# Patient Record
Sex: Female | Born: 1990 | Race: White | Hispanic: No | Marital: Married | Smoking: Never smoker
Health system: Southern US, Community
[De-identification: ages and names within clinical notes are randomized; demographics above are authoritative.]

## PROBLEM LIST (undated history)

## (undated) ENCOUNTER — Inpatient Hospital Stay (HOSPITAL_COMMUNITY): Payer: Self-pay

## (undated) ENCOUNTER — Ambulatory Visit

## (undated) DIAGNOSIS — R51 Headache: Secondary | ICD-10-CM

## (undated) DIAGNOSIS — G90A Postural orthostatic tachycardia syndrome (POTS): Secondary | ICD-10-CM

## (undated) DIAGNOSIS — R519 Headache, unspecified: Secondary | ICD-10-CM

## (undated) DIAGNOSIS — R55 Syncope and collapse: Secondary | ICD-10-CM

## (undated) DIAGNOSIS — R079 Chest pain, unspecified: Secondary | ICD-10-CM

## (undated) HISTORY — DX: Headache, unspecified: R51.9

## (undated) HISTORY — DX: Postural orthostatic tachycardia syndrome (POTS): G90.A

## (undated) HISTORY — DX: Syncope and collapse: R55

## (undated) HISTORY — PX: NO PAST SURGERIES: SHX2092

## (undated) HISTORY — DX: Chest pain, unspecified: R07.9

## (undated) HISTORY — DX: Headache: R51

---
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
## 2010-01-10 ENCOUNTER — Telehealth: Payer: Self-pay | Admitting: Family Medicine

## 2010-01-24 ENCOUNTER — Ambulatory Visit: Payer: Self-pay | Admitting: Family Medicine

## 2010-01-26 LAB — CONVERTED CEMR LAB
Chlamydia, DNA Probe: NEGATIVE
Hep B C IgM: NEGATIVE
hCG, Beta Chain, Quant, S: 0.28 milliintl units/mL

## 2010-02-07 ENCOUNTER — Ambulatory Visit: Payer: Self-pay | Admitting: Family Medicine

## 2010-02-07 LAB — CONVERTED CEMR LAB: Beta hcg, urine, semiquantitative: NEGATIVE

## 2010-02-10 ENCOUNTER — Telehealth: Payer: Self-pay | Admitting: Family Medicine

## 2010-03-28 ENCOUNTER — Telehealth (INDEPENDENT_AMBULATORY_CARE_PROVIDER_SITE_OTHER): Payer: Self-pay | Admitting: *Deleted

## 2010-05-02 ENCOUNTER — Encounter: Payer: Self-pay | Admitting: Family Medicine

## 2010-05-02 ENCOUNTER — Ambulatory Visit
Admission: RE | Admit: 2010-05-02 | Discharge: 2010-05-02 | Payer: Self-pay | Source: Home / Self Care | Attending: Family Medicine | Admitting: Family Medicine

## 2010-05-11 NOTE — Assessment & Plan Note (Signed)
Summary: COUGH,CONGESTION/CLE   Vital Signs:  Patient profile:   20 year old female Height:      67 inches Weight:      111.2 pounds BMI:     17.48 Temp:     101.7 degrees F oral Pulse rate:   88 / minute Pulse rhythm:   regular BP sitting:   90 / 60  (left arm) Cuff size:   regular  Vitals Entered By: Benny Lennert CMA Duncan Dull) (April 25, 2009 12:37 PM)  History of Present Illness: Chief complaint cough congestion and sore throat  20 year old fever with a one-week history of fever, with a temperature max of 102F, arthralgias, coughing, sinus pain and congestion.  Primary complaints are arthralgias and cough. She has been taking some Mucinex DM some NyQuil at nighttime. She also has tried Delsym, which has helped some.  Nothing has really helped with her short throat, and she continues now to have some sinus pain.  She is a Printmaker in college.  REVIEW OF SYSTEMS GEN: Acute illness details above. CV: No chest pain or SOB GI: No noted N or V Otherwise, pertinent positives and negatives are noted in the HPI.   Gen: WDWN, NAD; A & O x3, cooperative. Pleasant.Globally Non-toxic HEENT: Normocephalic and atraumatic. Throat clear, w/o exudate, R TM clear, L TM - good landmarks, No fluid present. rhinnorhea. frontal sinus T. MMM NECK: Anterior cervical  LAD is present CV: RRR, No M/G/R, cap refill <2 sec PULM: Breathing comfortably in no respiratory distress. no wheezing, crackles, rhonchi ABD: S,NT,ND,+BS. No HSM. No rebound. EXT: No c/c/e PSYCH: Friendly, good eye contact MSK: Nml gait   Allergies: 1)  Vicodin (Hydrocodone-Acetaminophen)   Impression & Recommendations:  Problem # 1:  INFLUENZA WITH OTHER RESPIRATORY MANIFESTATIONS (ICD-487.1) Assessment New  The patient's clinical exam and history is consistent with a diagnosis of pneumonia.  I discussed with the patient that the CDC and [place] Infectious disease personel are recommended that we not do the  rapid influenza screening test on people that have a clinical history and exam consistent with the diagnosis.  Supportive care. Fluids. Cough medicines as needed  Anti-pyretics.  Infection control emphasized, including OOW or school until AF 24 hours.   Orders: Rapid Strep (04540)  Problem # 2:  SINUSITIS - ACUTE-NOS (ICD-461.9) Assessment: New cannot rule out sinusitis secondary with pain - treat with amox, high dose  Her updated medication list for this problem includes:    Amoxicillin 875 Mg Tabs (Amoxicillin) .Marland Kitchen... 1 by mouth two times a day  Complete Medication List: 1)  Sprintec 28 0.25-35 Mg-mcg Tabs (Norgestimate-eth estradiol) .Marland Kitchen.. 1 tab by mouth daily 2)  Amoxicillin 875 Mg Tabs (Amoxicillin) .Marland Kitchen.. 1 by mouth two times a day Prescriptions: AMOXICILLIN 875 MG TABS (AMOXICILLIN) 1 by mouth two times a day  #20 x 0   Entered and Authorized by:   Hannah Beat MD   Signed by:   Hannah Beat MD on 04/25/2009   Method used:   Electronically to        [facility]  [address] [phone]* (retail)       [address]       [place], [place]  [address]       Ph: [phone]       Fax: [phone]   RxID:   [id number]   Current Allergies (reviewed today): VICODIN (HYDROCODONE-ACETAMINOPHEN)  Laboratory Results    Other Tests  Rapid  Strep: negative  Kit Test Internal QC: Positive   (Normal Range: Negative)

## 2010-05-11 NOTE — Letter (Signed)
Summary: Medical Report Form  Medical Report Form   Imported By: Lanelle Bal 04/15/2009 11:41:58  _____________________________________________________________________  External Attachment:    Type:   Image     Comment:   External Document

## 2010-05-11 NOTE — Progress Notes (Signed)
Summary: regarding depo  Phone Note Call from Patient Call back at Home Phone [phone] Call back at [phone]   Caller: Mom Summary of Call: Pt's mother states pt was given depo provera and asks if she needs to use back up [facility] or is depo effective from the start. Advised pt to use back up method for one month  Initial call taken by: Lowella Petties CMA, AAMA,  February 10, 2010 4:22 PM  Follow-up for Phone Call        Agree. Follow-up by: Kerby Nora MD,  February 10, 2010 4:33 PM

## 2010-05-11 NOTE — Assessment & Plan Note (Signed)
Summary: CPX/ lb   Vital Signs:  Patient profile:   20 year old female Height:      67 inches Weight:      111.0 pounds BMI:     17.45 Temp:     99.3 degrees F oral Pulse rate:   88 / minute Pulse rhythm:   regular BP sitting:   90 / 60  (left arm) Cuff size:   regular  Vitals Entered By: Benny Lennert CMA Duncan Dull) (January 24, 2010 4:06 PM)  History of Present Illness: Chief complaint discuss birth control ? pregnancy    The patient is here for annual wellness exam and preventative care.     Interested in DEPO for birth control. She is currently sexually active. First day of last menses.Marland KitchenMarland Kitchen9/14 Started on new packet of sprintec...but missed 1 week of pills...so restarted new packet. Continued sexually activity. Was due for menses 10/14... At home urine test was negative.   No known exposure to STDs.     Problems Prior to Update: 1)  Oligomenorrhea  (ICD-626.1) 2)  Sinusitis - Acute-nos  (ICD-461.9) 3)  Influenza With Other Respiratory Manifestations  (ICD-487.1) 4)  Sexually Transmitted Disease, Exposure To  (ICD-V01.6) 5)  Contraceptive Management  (ICD-V25.09) 6)  Underweight  (ICD-783.22) 7)  Oligomenorrhea  (ICD-626.1) 8)  Headache  (ICD-784.0) 9)  Vasovagal Syncope  (ICD-780.2)  Current Medications (verified): 1)  Sprintec 28 0.25-35 Mg-Mcg Tabs (Norgestimate-Eth Estradiol) .Marland Kitchen.. 1 Tab By Mouth Daily  Allergies: 1)  Vicodin (Hydrocodone-Acetaminophen)  Past History:  Past medical, surgical, family and social histories (including risk factors) reviewed, and no changes noted (except as noted below).  Family History: Reviewed history from 06/08/2008 and no changes required. father: no medical history, no contact with him PGM: MI age 59 mother: healthy MGM: osteoporosis MGF: borderline DM no siblings  Social History: Reviewed history from 06/08/2008 and no changes required. In 11th grade homeschool due to frequent syncope. Plays volleyball. No sure  career plans, likely college. Good grades with work. Not sexaually active.  Review of Systems General:  Complains of fatigue; denies fever. CV:  Denies chest pain or discomfort. Resp:  Denies shortness of breath. GI:  Denies abdominal pain, bloody stools, change in bowel habits, constipation, diarrhea, nausea, and vomiting; abdominal bloating x several days  Eating fruits and veggies... GU:  Denies dysuria and hematuria. Derm:  Denies rash.  Physical Exam  General:  Well-developed,well-nourished,in no acute distress; alert,appropriate and cooperative throughout examination Ears:  External ear exam shows no significant lesions or deformities.  Otoscopic examination reveals clear canals, tympanic membranes are intact bilaterally without bulging, retraction, inflammation or discharge. Hearing is grossly normal bilaterally. Nose:  External nasal examination shows no deformity or inflammation. Nasal mucosa are pink and moist without lesions or exudates. Mouth:  Oral mucosa and oropharynx without lesions or exudates.  Teeth in good repair. Neck:  no carotid bruit or thyromegaly no cervical or supraclavicular lymphadenopathy  Lungs:  Normal respiratory effort, chest expands symmetrically. Lungs are clear to auscultation, no crackles or wheezes. Heart:  Normal rate and regular rhythm. S1 and S2 normal without gallop, murmur, click, rub or other extra sounds. Abdomen:  Bowel sounds positive,abdomen soft and non-tender without masses, organomegaly or hernias noted. Genitalia:  Pelvic Exam:        External: normal female genitalia without lesions or masses        Vagina: normal without lesions or masses        Cervix: normal without lesions or masses  Adnexa: normal bimanual exam without masses or fullness        Uterus: normal by palpation        Pap smear: not performed Pulses:  R and L posterior tibial pulses are full and equal bilaterally  Extremities:  no edema  Neurologic:  No  cranial nerve deficits noted. Station and gait are normal. Plantar reflexes are down-going bilaterally. DTRs are symmetrical throughout. Sensory, motor and coordinative functions appear intact. Skin:  Intact without suspicious lesions or rashes Psych:  Cognition and judgment appear intact. Alert and cooperative with normal attention span and concentration. No apparent delusions, illusions, hallucinations   Impression & Recommendations:  Problem # 1:  Preventive Health Care (ICD-V70.0) The patient's preventative maintenance and recommended screening tests for an annual wellness exam were reviewed in full today. Brought up to date unless services declined.  Counselled on the importance of diet, exercise, and its role in overall health and mortality. The patient's FH and SH was reviewed, including their home life, tobacco status, and drug and alcohol status.     Problem # 2:  OLIGOMENORRHEA (ICD-626.1) Pregnancy test Orders: TLB-Preg Serum Quant (B-hCG) (84702-HCG-QN)  Problem # 3:  SEXUALLY TRANSMITTED DISEASE, EXPOSURE TO (ICD-V01.6)  Orders: T-Hepatitis Acute Panel (04540-98119) T-GC Probe, genital (14782-95621) T-Chlamydia Probe, genital (30865-78469) T-RPR (Syphilis) (62952-84132) T-HIV Antibody  (Reflex) ([id number])  Problem # 4:  CONTRACEPTIVE MANAGEMENT (ICD-V25.09) Discussed options. If preg test negative x 2 ..will change to Depo  Complete Medication List: 1)  Sprintec 28 0.25-35 Mg-mcg Tabs (Norgestimate-eth estradiol) .Marland Kitchen.. 1 tab by mouth daily  Other Orders: Flu Vaccine 18yrs + (66440) Admin 1st Vaccine (34742) Tdap => 75yrs IM (59563) Admin of Any Addtl Vaccine (87564)  Patient Instructions: 1)  We will cal with blood test results. 2)   if negative pregnancy test..return in 2 weeks for urine pregnancy with RN OV...if negative you can recieve DEPO at that OV.    Orders Added: 1)  T-Hepatitis Acute Panel [80074-22940] 2)  T-GC Probe, genital [phone] 3)   T-Chlamydia Probe, genital [66063-01601] 4)  T-RPR (Syphilis) [phone] 5)  T-HIV Antibody  (Reflex) [[id number]] 6)  Flu Vaccine 87yrs + [90658] 7)  Admin 1st Vaccine [90471] 8)  Tdap => 49yrs IM [90715] 9)  Admin of Any Addtl Vaccine [90472] 10)  TLB-Preg Serum Quant (B-hCG) [84702-HCG-QN] 11)  Est. Patient 18-39 years [99395]   Immunizations Administered:  Influenza Vaccine # 1:    Vaccine Type: Fluvax 3+    Site: left deltoid    Mfr: GlaxoSmithKline    Dose: 0.5 ml    Route: IM    Given by: Benny Lennert CMA (AAMA)    Exp. Date: 10/07/2010    Lot #: [id number]    VIS given: 11/01/09 version given January 24, 2010.  Tetanus Vaccine:    Vaccine Type: Tdap    Site: right deltoid    Mfr: GlaxoSmithKline    Dose: 0.5 ml    Route: IM    Given by: Benny Lennert CMA (AAMA)    Exp. Date: 01/27/2012    Lot #: [id number]    VIS given: 02/25/08 version given January 24, 2010.  Flu Vaccine Consent Questions:    Do you have a history of severe allergic reactions to this vaccine? no    Any prior history of allergic reactions to egg and/or gelatin? no    Do you have a sensitivity to the preservative Thimersol? no    Do you have a past history of Guillan-Barre Syndrome?  no    Do you currently have an acute febrile illness? no    Have you ever had a severe reaction to latex? no    Vaccine information given and explained to patient? yes    Are you currently pregnant? no   Immunizations Administered:  Influenza Vaccine # 1:    Vaccine Type: Fluvax 3+    Site: left deltoid    Mfr: GlaxoSmithKline    Dose: 0.5 ml    Route: IM    Given by: Benny Lennert CMA (AAMA)    Exp. Date: 10/07/2010    Lot #: [id number]    VIS given: 11/01/09 version given January 24, 2010.  Tetanus Vaccine:    Vaccine Type: Tdap    Site: right deltoid    Mfr: GlaxoSmithKline    Dose: 0.5 ml    Route: IM    Given by: Benny Lennert CMA (AAMA)    Exp. Date: 01/27/2012    Lot #:  [id number]    VIS given: 02/25/08 version given January 24, 2010.  Current Allergies (reviewed today): VICODIN (HYDROCODONE-ACETAMINOPHEN) Flu Vaccine Result Date:  01/24/2010 Flu Vaccine Result:  given Flu Vaccine Next Due:  1 yr TD Result Date:  01/24/2010 TD Result:  given TD Next Due:  10 yr HDL Next Due:  Not Indicated LDL Next Due:  Not Indicated

## 2010-05-11 NOTE — Assessment & Plan Note (Signed)
Summary: pregnancy test/dlo  Nurse Visit   Allergies: 1)  Vicodin (Hydrocodone-Acetaminophen) Laboratory Results   Urine Tests      Urine HCG: negative    Medication Administration  Injection # 1:    Medication: Depo-Provera 150mg     Diagnosis: CONTRACEPTIVE MANAGEMENT (ICD-V25.09)    Route: IM    Site: LUOQ gluteus    Exp Date: 06/07/2012    Lot #: [id number]    Mfr: Francisca December    Patient tolerated injection without complications    Given by: Benny Lennert CMA Duncan Dull) (February 07, 2010 4:24 PM)  Orders Added: 1)  Depo-Provera 150mg  [J1055] 2)  Admin of Therapeutic Inj  intramuscular or subcutaneous [04540]

## 2010-05-11 NOTE — Assessment & Plan Note (Signed)
Summary: DEPO  Nurse Visit   Allergies: 1)  Vicodin (Hydrocodone-Acetaminophen)  Medication Administration  Injection # 1:    Medication: Depo-Provera 150mg     Diagnosis: CONTRACEPTIVE MANAGEMENT (ICD-V25.09)    Route: IM    Site: LUOQ gluteus    Exp Date: 08/07/2012    Lot #: [id number]    Mfr: GREENSTONE    Patient tolerated injection without complications    Given by: Mervin Hack CMA Duncan Dull) (May 02, 2010 5:01 PM)  Orders Added: 1)  Depo-Provera 150mg  [J1055] 2)  Admin of Therapeutic Inj  intramuscular or subcutaneous [96372]   Medication Administration  Injection # 1:    Medication: Depo-Provera 150mg     Diagnosis: CONTRACEPTIVE MANAGEMENT (ICD-V25.09)    Route: IM    Site: LUOQ gluteus    Exp Date: 08/07/2012    Lot #: [id number]    Mfr: GREENSTONE    Patient tolerated injection without complications    Given by: Mervin Hack CMA Duncan Dull) (May 02, 2010 5:01 PM)  Orders Added: 1)  Depo-Provera 150mg  [J1055] 2)  Admin of Therapeutic Inj  intramuscular or subcutaneous [81191]

## 2010-05-11 NOTE — Progress Notes (Signed)
Summary: wants to change to depo provera  Phone Note Call from Patient   Caller: Ariel Hale  [phone] x [phone] Summary of Call: Mother states pt wants to change to depo provera, she is willing to pay out of pocket for this if insurance wont cover.  She says pt discussed this with you about a year and a half ago.  [facility]  She can get the vials from the pharmacy and come here for administration. Initial call taken by: Lowella Petties CMA,  January 10, 2010 12:24 PM  Follow-up for Phone Call        Can we not just use what we have here? or is it cheaper for her to get at pharm. She needs to come in within 7 days of her next menses to have U [preg...if negative we can start Depo then.  She is also overdue for yearly CPX...will need labs prior for STD testing given birth control use. 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

## 2010-05-11 NOTE — Progress Notes (Signed)
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  Mother is asking how long they should wait for sxs to appear.  Pt is supposed to be around elderly relatives on saturday night.  She had the exposure yesterday. Initial call taken by: Lowella Petties CMA, AAMA,  March 28, 2010 10:38 AM  Follow-up for Phone Call        It sounds like she is not  sick -- if she is not sick, she will know by saturday night.  If symptoms come out (ie. she is sick), she should avoid people undergoing chemotherapy, radiation, end-stage cancer, or AIDS.   Most adults are already immune to most of the strains of hand-foot-mouth. Follow-up by: Hannah Beat MD,  March 28, 2010 10:59 AM  Additional Follow-up for Phone Call Additional follow up Details #1::        Phone Call Completed message left for mother with information and advised to call back with any questions.Consuello Masse CMA    Additional Follow-up by: Benny Lennert CMA Duncan Dull),  March 28, 2010 11:02 AM

## 2010-05-25 NOTE — Letter (Signed)
Summary: [facility]   [facility]   Imported By: Kassie Mends 05/17/2010 11:35:01  _____________________________________________________________________  External Attachment:    Type:   Image     Comment:   External Document

## 2010-05-29 ENCOUNTER — Encounter: Payer: Self-pay | Admitting: Family Medicine

## 2010-05-29 ENCOUNTER — Ambulatory Visit (INDEPENDENT_AMBULATORY_CARE_PROVIDER_SITE_OTHER): Payer: BC Managed Care – PPO | Admitting: Family Medicine

## 2010-05-29 DIAGNOSIS — J069 Acute upper respiratory infection, unspecified: Secondary | ICD-10-CM

## 2010-05-29 DIAGNOSIS — H109 Unspecified conjunctivitis: Secondary | ICD-10-CM

## 2010-05-29 DIAGNOSIS — R55 Syncope and collapse: Secondary | ICD-10-CM

## 2010-05-30 ENCOUNTER — Telehealth: Payer: Self-pay | Admitting: Family Medicine

## 2010-05-30 ENCOUNTER — Encounter (INDEPENDENT_AMBULATORY_CARE_PROVIDER_SITE_OTHER): Payer: Self-pay | Admitting: *Deleted

## 2010-05-30 ENCOUNTER — Other Ambulatory Visit: Payer: Self-pay | Admitting: Family Medicine

## 2010-05-30 ENCOUNTER — Ambulatory Visit (INDEPENDENT_AMBULATORY_CARE_PROVIDER_SITE_OTHER): Payer: BC Managed Care – PPO | Admitting: Family Medicine

## 2010-05-30 ENCOUNTER — Encounter: Payer: Self-pay | Admitting: Family Medicine

## 2010-05-30 DIAGNOSIS — R55 Syncope and collapse: Secondary | ICD-10-CM

## 2010-05-30 DIAGNOSIS — R51 Headache: Secondary | ICD-10-CM

## 2010-05-30 DIAGNOSIS — F341 Dysthymic disorder: Secondary | ICD-10-CM | POA: Insufficient documentation

## 2010-05-30 DIAGNOSIS — M542 Cervicalgia: Secondary | ICD-10-CM

## 2010-05-30 DIAGNOSIS — R519 Headache, unspecified: Secondary | ICD-10-CM

## 2010-05-30 LAB — B12 AND FOLATE PANEL: Vitamin B-12: 451 pg/mL (ref 211–911)

## 2010-05-30 LAB — BASIC METABOLIC PANEL
BUN: 11 mg/dL (ref 6–23)
Chloride: 105 mEq/L (ref 96–112)
GFR: 97.76 mL/min (ref >60.00)
Potassium: 4.5 mEq/L (ref 3.5–5.1)
Sodium: 140 mEq/L (ref 135–145)

## 2010-05-30 LAB — CBC WITH DIFFERENTIAL/PLATELET
Basophils Absolute: 0.1 K/uL (ref 0.0–0.1)
Basophils Relative: 0.7 % (ref 0.0–3.0)
Eosinophils Absolute: 0.3 K/uL (ref 0.0–0.7)
Eosinophils Relative: 3.5 % (ref 0.0–5.0)
HCT: 37.9 % (ref 36.0–46.0)
Hemoglobin: 12.6 g/dL (ref 12.0–15.0)
Lymphocytes Relative: 23.4 % (ref 12.0–46.0)
Lymphs Abs: 2.2 K/uL (ref 0.7–4.0)
MCHC: 33.3 g/dL (ref 30.0–36.0)
MCV: 81.8 fl (ref 78.0–100.0)
Monocytes Absolute: 1.2 K/uL — ABNORMAL HIGH (ref 0.1–1.0)
Monocytes Relative: 12.2 % — ABNORMAL HIGH (ref 3.0–12.0)
Neutro Abs: 5.7 K/uL (ref 1.4–7.7)
Neutrophils Relative %: 60.2 % (ref 43.0–77.0)
Platelets: 273 K/uL (ref 150.0–400.0)
RBC: 4.63 Mil/uL (ref 3.87–5.11)
RDW: 15.3 % — ABNORMAL HIGH (ref 11.5–14.6)
WBC: 9.5 K/uL (ref 4.5–10.5)

## 2010-05-30 LAB — TSH: TSH: 1 u[IU]/mL (ref 0.35–5.50)

## 2010-05-31 ENCOUNTER — Ambulatory Visit (INDEPENDENT_AMBULATORY_CARE_PROVIDER_SITE_OTHER)
Admission: RE | Admit: 2010-05-31 | Discharge: 2010-05-31 | Disposition: A | Discharge status: Home / Self Care | Payer: BC Managed Care – PPO | Source: Ambulatory Visit | Attending: Family Medicine | Admitting: Family Medicine

## 2010-05-31 ENCOUNTER — Encounter: Payer: Self-pay | Admitting: Family Medicine

## 2010-05-31 DIAGNOSIS — R55 Syncope and collapse: Secondary | ICD-10-CM

## 2010-05-31 DIAGNOSIS — R51 Headache: Secondary | ICD-10-CM

## 2010-05-31 DIAGNOSIS — R519 Headache, unspecified: Secondary | ICD-10-CM

## 2010-05-31 LAB — HEPATIC FUNCTION PANEL
ALT: 30 U/L (ref 0–35)
AST: 27 U/L (ref 0–37)
Total Bilirubin: 0.5 mg/dL (ref 0.3–1.2)
Total Protein: 7.8 g/dL (ref 6.0–8.3)

## 2010-06-06 NOTE — Letter (Signed)
Summary: Out of Work  [facility] [employer] [facility]  [address] [place], [place] [address]   Phone: [phone]  Fax: [phone]    May 30, 2010   Employee:  RENAE MOTTLEY CHAVIS    To Whom It May Concern:   For Medical reasons, please excuse the above named employee from work for the following dates:  Start:   February 20th 2012  End:   May return on Febuary 23 2012  If you need additional information, please feel free to contact our office.         Sincerely,   Kerby Nora Md

## 2010-06-06 NOTE — Assessment & Plan Note (Addendum)
Summary: F/U FROM DR COPLAND/CLE  [facility]   Vital Signs:  Patient profile:   20 year old female Height:      67 inches Weight:      118.75 pounds BMI:     18.67 Temp:     98.6 degrees F oral Pulse rate:   76 / minute Pulse rhythm:   regular BP sitting:   110 / 70  (left arm) Cuff size:   regular  Vitals Entered By: Benny Lennert CMA Duncan Dull) (May 30, 2010 11:24 AM)  History of Present Illness: Chief complaint follow up per copland  Yesterday at appt for conjunctivitis... syncopal episode in the office. at the completion of my history and examination, the patient got pale and lightheaded, and subsequently  moved in a supine direction and  appeared to have a syncopal episode for  1-2 seconds.  She then awoke, and was oriented to self, place,  year, state, Pres of [place], address. the chart was reviewed including prior notes from [facility] cardiology, is noted that she had a normal echo, and electrocardiogram in the past. She had a positive tilt table test, ultimately specialists concluded that she does have significant vasovagal  symptoms the  and has had  a couple episodes such as this ongoing for about the last 5 years. None recent.  Last saw Dr. Ace Gins...2009.   EKG: Normal sinus rhythm. Normal axis, normal R wave progression, No acute ST elevation or depression. irbb   In office:  placed with feet elevated for a few minutes with good recovery. clearly alert and oriented after this incident.  took theraflu this AM.  Dr. Cyndie Chime recommendations: Given prior extensive work-up, pos tilt table, I think we can assume this was a vasovagal episode. push fluids, salt, plain mucinex and avoid decongestants.  Since yesterday feeling lightheaded, weak. Decreased appetite. Headache today ( after fainting felt lke hit in head with hammer) She states she has headaches every day.. for past year. Not using OTC meds regularly. Tearful about passing out episode.  Runny nose, sore throat, cough. Some  achiness, and generally not feeling well. Some ear fullness. No nausea, vomiting or diarrhea.  Per mother symptoms are occuring more frequently. Neck Pain: Has pain in right or left anterior neck...severe lasts minuteds.... then is sore afterward. No associated times of occurence. No association with passing out. No problems swallowing. No association when eating. She does feel anxious.. she is alwys worried she will pass out. More tearful lately.    Problems Prior to Update: 1)  Uri  (ICD-465.9) 2)  Conjunctivitis  (ICD-372.30) 3)  Routine Gynecological Examination  (ICD-V72.31) 4)  Physical Examination  (ICD-V70.0) 5)  Oligomenorrhea  (ICD-626.1) 6)  Sinusitis - Acute-nos  (ICD-461.9) 7)  Influenza With Other Respiratory Manifestations  (ICD-487.1) 8)  Sexually Transmitted Disease, Exposure To  (ICD-V01.6) 9)  Contraceptive Management  (ICD-V25.09) 10)  Underweight  (ICD-783.22) 11)  Oligomenorrhea  (ICD-626.1) 12)  Headache  (ICD-784.0) 13)  Vasovagal Syncope  (ICD-780.2)  Current Medications (verified): 1)  Polytrim 10000-0.1 Unit/ml-%  Soln (Polymyxin B-Trimethoprim) .Marland Kitchen.. 1 Gtt in Affected Eye 4 Times Daily X 7 Days  Allergies: 1)  Vicodin (Hydrocodone-Acetaminophen)  Past History:  Past medical, surgical, family and social histories (including risk factors) reviewed, and no changes noted (except as noted below).  Past Medical History: Reviewed history from 05/29/2010 and no changes required. frequent vasovagal syncope  Family History: Reviewed history from 06/08/2008 and no changes required. father: no medical history, no contact with him  PGM: MI age 40 mother: healthy MGM: osteoporosis MGF: borderline DM no siblings  Social History: Reviewed history from 06/08/2008 and no changes required. In 11th grade homeschool due to frequent syncope. Plays volleyball. No sure career plans, likely college. Good grades with work. Not sexaually active.  Review of  Systems       bloody noses occuring monthly General:  Denies fatigue and fever. CV:  Denies chest pain or discomfort. Resp:  Denies shortness of breath. GI:  Denies abdominal pain and bloody stools. GU:  Denies abnormal vaginal bleeding and dysuria. Psych:  Complains of depression and panic attacks; denies anxiety.  Physical Exam  General:  tearful thin appearing female  in no physical distress, no increased work of breathing Head:  frontal ttp   no maxullar yinu ttp Ears:  clear lfuid B TMS  Nose:  nasal discharge, no mucosal pallor.   Mouth:  MMM Neck:  no carotid bruit or thyromegaly no cervical or supraclavicular lymphadenopathy  Lungs:  Normal respiratory effort, chest expands symmetrically. Lungs are clear to auscultation, no crackles or wheezes. Heart:  Normal rate and regular rhythm. S1 and S2 normal without gallop, murmur, click, rub or other extra sounds. Abdomen:  Bowel sounds positive,abdomen soft and non-tender without masses, organomegaly or hernias noted. Pulses:  R and L posterior tibial pulses are full and equal bilaterally  Extremities:  no edema  Neurologic:  No cranial nerve deficits noted. Station and gait are normal. Plantar reflexes are down-going bilaterally. DTRs are symmetrical throughout. Sensory, motor and coordinative functions appear intact. Skin:  Intact without suspicious lesions or rashes Psych:  Oriented X3, memory intact for recent and remote, subdued, and tearful.     Impression & Recommendations:  Problem # 1:  SYNCOPE (ICD-780.2) Per cards.Marland Kitchen appears consistent with vasovagal syncope... but pt and mother concerned as it is happening frequently (yesterday may have been triggeres by carotid pressure with lymph node eval by Dr. Patsy Lager) and ofter withou clear cause. Also given daily headhjaces... we will eval with labs further, check Head CT and refer to neuro for further recommendations.  Orders: Neurology Referral (Neuro) Radiology Referral  (Radiology) TLB-BMP (Basic Metabolic Panel-BMET) (80048-METABOL) TLB-CBC Platelet - w/Differential (85025-CBCD) TLB-Hepatic/Liver Function Pnl (80076-HEPATIC) TLB-TSH (Thyroid Stimulating Hormone) (84443-TSH) TLB-B12 + Folate Pnl (95621_30865-H84/ONG)  Problem # 2:  DEPRESSION/ANXIETY (ICD-300.4) New diagnosis... refer for counsleing. Will hold of on medicaiton at this time to avoid med side effects that could worsen lightheadedness.  Close follow up in 2-3 weeks.  Orders: Psychology Referral (Psychology)  Problem # 3:  NECK PAIN, ACUTE (ICD-723.1) No clear assocaitipon with syncope.. no lymphadenopathy or thyroid eval noted. Will eval with labs.  ? due to muscle spasm/strain.   Problem # 4:  HEADACHE (ICD-784.0) Possibly due to migraine. Unable to keep migraine diary as requested in apst.  Eval with Ct and refer to neuro.  Orders: Radiology Referral (Radiology)  Complete Medication List: 1)  Polytrim 10000-0.1 Unit/ml-% Soln (Polymyxin b-trimethoprim) .Marland Kitchen.. 1 gtt in affected eye 4 times daily x 7 days  Patient Instructions: 1)  Push fluids. 2)  Tylenol for headhace. 3)  Referral Appointment Information 4)  Day/Date: 5)  Time: 6)  Place/MD: 7)  Address: 8)  Phone/Fax: 9)  Patient given appointment information. Information/Orders faxed/mailed. 10)  Please schedule a follow-up appointment in 2-3 weeks 30 min OV syncope   Orders Added: 1)  Neurology Referral [Neuro] 2)  Psychology Referral [Psychology] 3)  Radiology Referral [Radiology] 4)  TLB-BMP (Basic Metabolic Panel-BMET) [  80048-METABOL] 5)  TLB-CBC Platelet - w/Differential [85025-CBCD] 6)  TLB-Hepatic/Liver Function Pnl [80076-HEPATIC] 7)  TLB-TSH (Thyroid Stimulating Hormone) [84443-TSH] 8)  TLB-B12 + Folate Pnl [82746_82607-B12/FOL] 9)  Est. Patient Level IV [09811]    Current Allergies (reviewed today): VICODIN (HYDROCODONE-ACETAMINOPHEN)

## 2010-06-06 NOTE — Progress Notes (Signed)
Summary: needs note for work  Phone Note Call from Patient Call back at [employer] [phone]   Caller: Mom- Cordelia Pen Call For: Kerby Nora MD Summary of Call: Mom is asking if daugther could get a note to excuse her from work for yesterday, today and tomorrow since she is going for her ct. Please call patient when letter is ready.  Initial call taken by: Melody Comas,  May 30, 2010 2:10 PM  Follow-up for Phone Call        Okay to wirte requested work excuse.  Follow-up by: Kerby Nora MD,  May 30, 2010 2:11 PM  Additional Follow-up for Phone Call Additional follow up Details #1::        note wrote and mother advised via message on machine.Consuello Masse CMA   Additional Follow-up by: Benny Lennert CMA Duncan Dull),  May 30, 2010 2:22 PM

## 2010-06-06 NOTE — Assessment & Plan Note (Signed)
Summary: ? CONJUNCTIVITIS   Vital Signs:  Patient profile:   20 year old female Height:      67 inches Weight:      119 pounds BMI:     18.71 Temp:     98.2 degrees F oral Pulse rate:   76 / minute Pulse rhythm:   regular BP sitting:   100 / 60  (left arm) Cuff size:   regular  Vitals Entered By: Benny Lennert CMA Duncan Dull) (May 29, 2010 9:48 AM)  History of Present Illness: Chief complaint conjunctivitis  20 year old female:  conjunctivitis: pleasant female who has some pinkish coloration her eyes and had some  crusting of her eyelids that cause it to  crust up this morning.  uri: Runny nose, sore throat, cough. Some achiness, and generally not feeling well. Some ear fullness. No nausea, vomiting or diarrhea.  syncopal episode in the office. at the completion of my history and examination, the patient got pale and lightheaded, and subsequently  moved in a supine direction and  appeared to have a syncopal episode for  1-2 seconds.  She then awoke, and was oriented to self, place,  year, state, [place] of [place], address. the chart was reviewed including prior notes from [facility] cardiology, is noted that she had a normal echo, and electrocardiogram in the past. She had a positive tilt table test, ultimately specialists concluded that she does have significant vasovagal  symptoms the  and has had  a couple episodes such as this ongoing for about the last 5 years. none recent.    Allergies: 1)  Vicodin (Hydrocodone-Acetaminophen)  Past History:  Past medical, surgical, family and social histories (including risk factors) reviewed, and no changes noted (except as noted below).  Past Medical History: frequent vasovagal syncope  Family History: Reviewed history from 06/08/2008 and no changes required. father: no medical history, no contact with him PGM: MI age 24 mother: healthy MGM: osteoporosis MGF: borderline DM no siblings  Social History: Reviewed history from 06/08/2008 and  no changes required. In 11th grade homeschool due to frequent syncope. Plays volleyball. No sure career plans, likely college. Good grades with work. Not sexaually active.  Review of Systems      See HPI General:  Complains of fatigue; denies fever and sweats. CV:  Complains of fainting and near fainting; denies chest pain or discomfort and palpitations.  Physical Exam  General:  GEN: WDWN, NAD; alert,appropriate and cooperative throughout exam HEENT: Normocephalic and atraumatic. Throat clear, w/o exudate, no LAD, R TM clear, L TM - good landmarks, No fluid present. rhinnorhea.  Left frontal and maxillary sinuses: NT Right frontal and maxillary sinuses: NT NECK: No ant or post LAD CV: RRR, No M/G/R PULM: no resp distress, no accessory muscles.  No retractions. no w/c/r ABD: S,NT,ND,+BS, No HSM EXTR: no c/c/e PSYCH: full affect, pleasant, conversant    Detailed Neurologic Exam  Speech:    Speech is normal; fluent and spontaneous with normal comprehension Cognition:    The patient is oriented to person, place, and time; memory intact; language fluent; normal attention, concentration, and fund of knowledge Cranial Nerves:    The pupils are equal, round, and reactive to light. The fundi are normal and spontaneous venous pulsations are present. Visual fields are full to finger confrontation. Extraocular movements are intact. Trigeminal sensation is intact and the muscles of mastication are normal. The face is symmetric. The palate elevates in the midline. Voice is normal. Shoulder shrug is normal. The  tongue has normal motion without fasciculations.  Coordination:    romberg neg Gait:    normal gait Trapezius:    No tightness or tenderness noted.  Tone:    Normal muscle tone.  Posture:    Posture is normal.  Light Touch:    Normal light touch sensation in upper and lower extremities.  Reflex Exam: DTR's:    Deep tendon reflexes in the upper and lower extremities are normal  bilaterally.     Impression & Recommendations:  Problem # 1:  CONJUNCTIVITIS (ICD-372.30) Assessment New  Her updated medication list for this problem includes:    Polytrim 10000-0.1 Unit/ml-% Soln (Polymyxin b-trimethoprim) .Marland Kitchen... 1 gtt in affected eye 4 times daily x 7 days  Discussed treatment, and urged patient to wash hands carefully after touching face.   Problem # 2:  URI (ICD-465.9) Assessment: New  Instructed on symptomatic treatment. Call if symptoms persist or worsen.   supportive care.  Problem # 3:  VASOVAGAL SYNCOPE (ICD-780.2) Assessment: Deteriorated EKG: Normal sinus rhythm. Normal axis, normal R wave progression, No acute ST elevation or depression. irbb  placed with feet elevated for a few minutes with good recovery. clearly alert and oriented after this incident.  took theraflu this AM.  given prior extensive work-up, pos tilt table, i think we can assume this was a vasovagal episode. push fluids, salt, plain mucinex and avoid decongestants.  Complete Medication List: 1)  Polytrim 10000-0.1 Unit/ml-% Soln (Polymyxin b-trimethoprim) .Marland Kitchen.. 1 gtt in affected eye 4 times daily x 7 days  Patient Instructions: 1)  f/u with Dr. Ermalene Searing at your convenience to discuss neck pain Prescriptions: POLYTRIM 10000-0.1 UNIT/ML-%  SOLN (POLYMYXIN B-TRIMETHOPRIM) 1 gtt in affected eye 4 times daily x 7 days  #1 x 0   Entered and Authorized by:   Hannah Beat MD   Signed by:   Hannah Beat MD on 05/29/2010   Method used:   Electronically to        [id number] (retail)       [address]       [place], [place]  [address]       Ph: [phone]       Fax: [phone]   RxID:   [id number]    Orders Added: 1)  Est. Patient Level IV [24401]    Prior Medications (reviewed today): None Current Allergies (reviewed today): VICODIN (HYDROCODONE-ACETAMINOPHEN)  Appended Document: Orders Update    Clinical Lists Changes  Orders: Added  new Service order of EKG w/ Interpretation (93000) - Signed

## 2010-06-12 ENCOUNTER — Telehealth: Payer: Self-pay | Admitting: Family Medicine

## 2010-06-15 NOTE — Miscellaneous (Signed)
Summary: [facility] Designated Party Release   [facility] Designated Party Release   Imported By: Roderic Ovens 06/07/2010 15:02:48  _____________________________________________________________________  External Attachment:    Type:   Image     Comment:   External Document

## 2010-06-20 ENCOUNTER — Ambulatory Visit: Payer: BC Managed Care – PPO | Admitting: Family Medicine

## 2010-06-20 NOTE — Progress Notes (Signed)
  Phone Note Call from Patient   Caller: Mom Summary of Call: Called and spoke to [facility], Courtneys mom. She said that Verna is being seen by her works Education officer, community for Psychology. She will see the Neurologist on 07/05/2010 at 1:30pm. Initial call taken by: Carlton Adam,  June 12, 2010 8:36 AM  Follow-up for Phone Call        [place]. Follow-up by: Kerby Nora MD,  June 12, 2010 12:35 PM

## 2010-07-25 ENCOUNTER — Ambulatory Visit (INDEPENDENT_AMBULATORY_CARE_PROVIDER_SITE_OTHER): Payer: BC Managed Care – PPO | Admitting: Family Medicine

## 2010-07-25 DIAGNOSIS — Z3009 Encounter for other general counseling and advice on contraception: Secondary | ICD-10-CM

## 2010-07-25 MED ORDER — MEDROXYPROGESTERONE ACETATE 150 MG/ML IM SUSP
150.0000 mg | Freq: Once | INTRAMUSCULAR | Status: AC
  Administered 2010-07-25: 150 mg via INTRAMUSCULAR

## 2010-07-25 NOTE — Progress Notes (Signed)
  Subjective:    Patient ID: Ariel Hale, female    DOB: 1991/03/29, 20 y.o.   MRN: [id number]  HPI  Med given.  Review of Systems     Objective:   Physical Exam        Assessment & Plan:

## 2010-11-03 ENCOUNTER — Ambulatory Visit (INDEPENDENT_AMBULATORY_CARE_PROVIDER_SITE_OTHER): Payer: BC Managed Care – PPO | Admitting: Family Medicine

## 2010-11-03 DIAGNOSIS — Z309 Encounter for contraceptive management, unspecified: Secondary | ICD-10-CM

## 2010-11-03 NOTE — Progress Notes (Signed)
Patient came in today to have depo provera injection.  She is off schedule for her injection so I advised her that she needs two negative pregnancy test before we can give her another depo provera injection.  Patient agreed.  Test was negative.

## 2010-11-17 ENCOUNTER — Ambulatory Visit (INDEPENDENT_AMBULATORY_CARE_PROVIDER_SITE_OTHER): Payer: BC Managed Care – PPO | Admitting: Family Medicine

## 2010-11-17 DIAGNOSIS — Z309 Encounter for contraceptive management, unspecified: Secondary | ICD-10-CM

## 2010-11-17 LAB — POCT URINE PREGNANCY: Preg Test, Ur: NEGATIVE

## 2010-11-17 MED ORDER — MEDROXYPROGESTERONE ACETATE 150 MG/ML IM SUSP
150.0000 mg | Freq: Once | INTRAMUSCULAR | Status: AC
  Administered 2010-11-17: 150 mg via INTRAMUSCULAR

## 2010-11-18 DIAGNOSIS — Z309 Encounter for contraceptive management, unspecified: Secondary | ICD-10-CM | POA: Insufficient documentation

## 2010-11-18 NOTE — Progress Notes (Signed)
  Subjective:    Patient ID: Ariel Hale, female    DOB: [birth year], 20 y.o.   MRN: [id number]  HPI  Nurse visit.  Review of Systems     Objective:   Physical Exam        Assessment & Plan:

## 2011-01-22 LAB — URINALYSIS, ROUTINE W REFLEX MICROSCOPIC
Bilirubin Urine: NEGATIVE
Hgb urine dipstick: NEGATIVE
Nitrite: NEGATIVE
Protein, ur: NEGATIVE
Urobilinogen, UA: 0.2

## 2011-01-22 LAB — POCT PREGNANCY, URINE
Operator id: 272551
Preg Test, Ur: NEGATIVE

## 2011-01-22 LAB — COMPREHENSIVE METABOLIC PANEL
AST: 25
Albumin: 4.4
Alkaline Phosphatase: 81
BUN: 10
CO2: 23
Chloride: 104
Potassium: 4
Total Bilirubin: 1

## 2011-02-07 ENCOUNTER — Ambulatory Visit (INDEPENDENT_AMBULATORY_CARE_PROVIDER_SITE_OTHER): Payer: BC Managed Care – PPO | Admitting: *Deleted

## 2011-02-07 DIAGNOSIS — Z309 Encounter for contraceptive management, unspecified: Secondary | ICD-10-CM

## 2011-02-07 LAB — POCT URINE PREGNANCY: Preg Test, Ur: NEGATIVE

## 2011-02-21 ENCOUNTER — Ambulatory Visit (INDEPENDENT_AMBULATORY_CARE_PROVIDER_SITE_OTHER): Payer: BC Managed Care – PPO | Admitting: *Deleted

## 2011-02-21 DIAGNOSIS — IMO0001 Reserved for inherently not codable concepts without codable children: Secondary | ICD-10-CM

## 2011-02-21 DIAGNOSIS — Z309 Encounter for contraceptive management, unspecified: Secondary | ICD-10-CM

## 2011-02-21 LAB — POCT URINE PREGNANCY: Preg Test, Ur: NEGATIVE

## 2011-02-21 MED ORDER — MEDROXYPROGESTERONE ACETATE 150 MG/ML IM SUSP
150.0000 mg | Freq: Once | INTRAMUSCULAR | Status: AC
  Administered 2011-02-21: 150 mg via INTRAMUSCULAR

## 2011-05-08 ENCOUNTER — Telehealth: Payer: Self-pay | Admitting: *Deleted

## 2011-05-08 ENCOUNTER — Telehealth: Payer: Self-pay | Admitting: Family Medicine

## 2011-05-08 DIAGNOSIS — R55 Syncope and collapse: Secondary | ICD-10-CM

## 2011-05-08 NOTE — Telephone Encounter (Signed)
Patient mother called back and they are trying seeing a dr in [place] for parathyroid and they would like calcium and parathyroid checked

## 2011-05-08 NOTE — Telephone Encounter (Signed)
What is mother's concern.. Why does she want thihs checked. I need to know to get covered by insurance.

## 2011-05-08 NOTE — Telephone Encounter (Signed)
Please cancel CMET.  Change instead to parathyroid hormone and ionized calcium.

## 2011-05-08 NOTE — Telephone Encounter (Signed)
Pt's mom is calling and is wanting to know if her daughter Ariel Hale can have her Calcium level checked for the Parathyroid and she is coming in on 05/09/11 for Depo.

## 2011-05-08 NOTE — Telephone Encounter (Signed)
Addended by: [name] on: 05/08/2011 04:18 PM   Modules accepted: Orders

## 2011-05-08 NOTE — Telephone Encounter (Signed)
Patients mother says that patient faints all the time and they have checked it in the past.

## 2011-05-09 ENCOUNTER — Ambulatory Visit: Payer: BC Managed Care – PPO

## 2011-05-10 ENCOUNTER — Other Ambulatory Visit (INDEPENDENT_AMBULATORY_CARE_PROVIDER_SITE_OTHER): Payer: BC Managed Care – PPO

## 2011-05-10 ENCOUNTER — Ambulatory Visit (INDEPENDENT_AMBULATORY_CARE_PROVIDER_SITE_OTHER): Payer: BC Managed Care – PPO | Admitting: *Deleted

## 2011-05-10 DIAGNOSIS — IMO0001 Reserved for inherently not codable concepts without codable children: Secondary | ICD-10-CM

## 2011-05-10 DIAGNOSIS — R55 Syncope and collapse: Secondary | ICD-10-CM

## 2011-05-10 DIAGNOSIS — Z309 Encounter for contraceptive management, unspecified: Secondary | ICD-10-CM

## 2011-05-10 MED ORDER — MEDROXYPROGESTERONE ACETATE 150 MG/ML IM SUSP
150.0000 mg | Freq: Once | INTRAMUSCULAR | Status: AC
  Administered 2011-05-10: 150 mg via INTRAMUSCULAR

## 2011-05-11 LAB — PTH, INTACT AND CALCIUM
Calcium, Total (PTH): 9.4 mg/dL (ref 8.4–10.5)
PTH: 18.3 pg/mL (ref 14.0–72.0)

## 2011-05-14 ENCOUNTER — Telehealth: Payer: Self-pay | Admitting: *Deleted

## 2011-05-14 NOTE — Telephone Encounter (Signed)
Patients mother advised via message on machine  

## 2011-05-14 NOTE — Telephone Encounter (Signed)
Schedule OV so we can do basic blood work, check blood counts, other things that can affect blood. Then we can make a better informed, directed question to any consultants.

## 2011-05-14 NOTE — Telephone Encounter (Signed)
Patients mother would like patient to have hematology referral because something is making patient pass out

## 2011-06-11 ENCOUNTER — Telehealth: Payer: Self-pay | Admitting: Family Medicine

## 2011-06-11 ENCOUNTER — Other Ambulatory Visit: Payer: Self-pay | Admitting: Family Medicine

## 2011-06-11 NOTE — Telephone Encounter (Signed)
Patient's mom said she left an email from a different doctor with you showing what kind of blood work that needs to be done on her daughter.  She wants to know when they can come in to get that blood work done.  She can be reached at [phone] ex [phone].

## 2011-06-11 NOTE — Telephone Encounter (Signed)
Looking over notes from specialist in [place], I am going to follow their advice and consult a local endocrinologist to be involved in her full work-up, which I think makes very good sense.

## 2011-06-14 NOTE — Telephone Encounter (Signed)
Appt made with Dr Tedd Sias patient aware of appt date and time. 06/14/2011 MK

## 2011-06-23 IMAGING — CT CT HEAD W/O CM
1 series · 16 of 30 positions shown, 20 images · non-contrast
Comparison: 10/23/2006

CLINICAL DATA: Headaches

CT HEAD WITHOUT CONTRAST
TECHNIQUE: Contiguous axial images were obtained from the base of
the skull through the vertex without contrast.

[Series 2: head_seq -c 4.5 h37s st · axial · 0.43mm/px · z∈[-168,-42]mm · 16 of 32 slices shown, 20 images]
[im 2/32  brain]
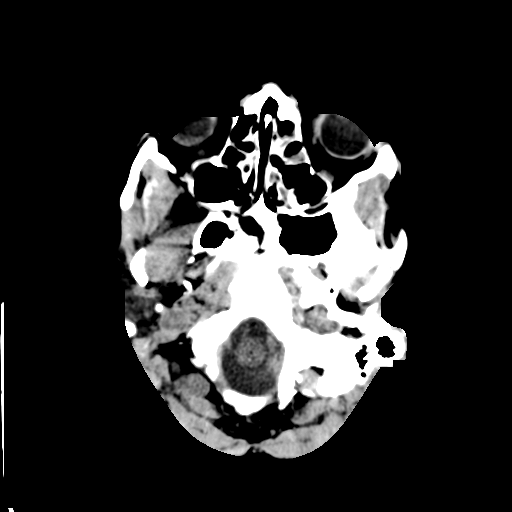
[im 2/32  bone]
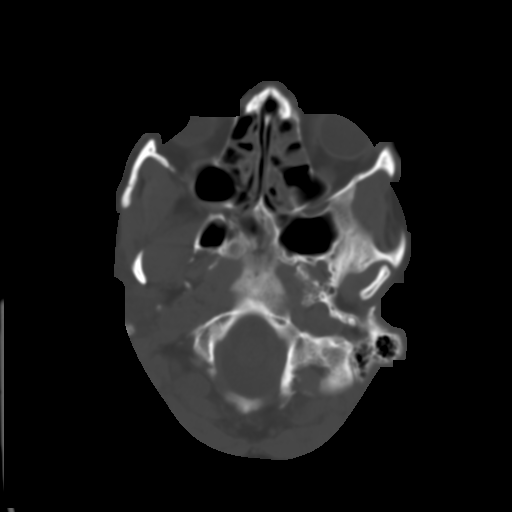
[im 4/32  brain]
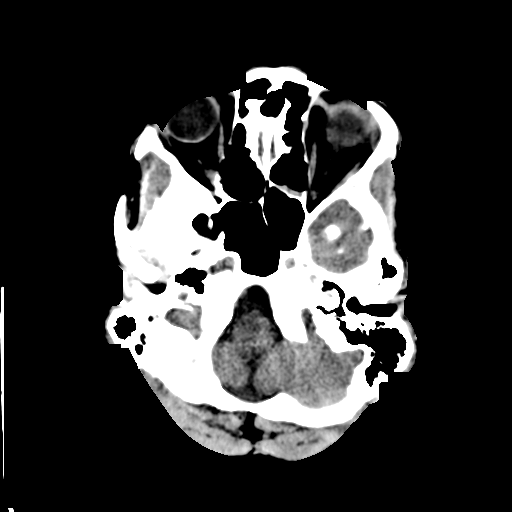
[im 6/32  brain]
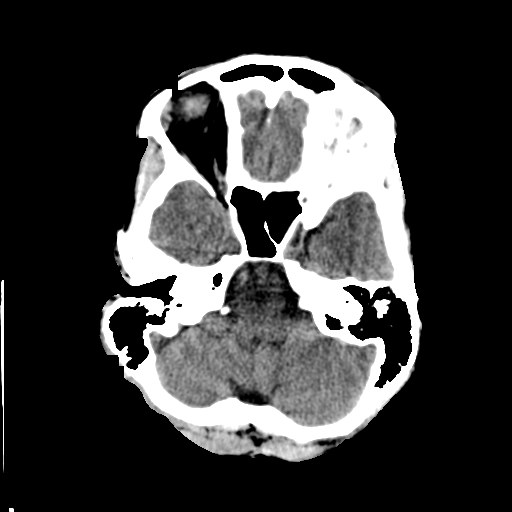
[im 8/32  brain]
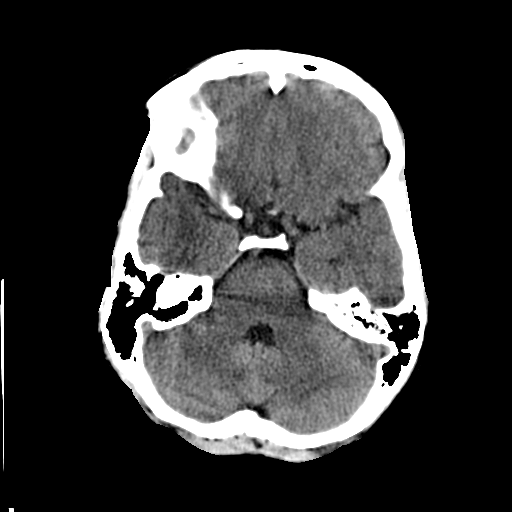
[im 9/32  brain]
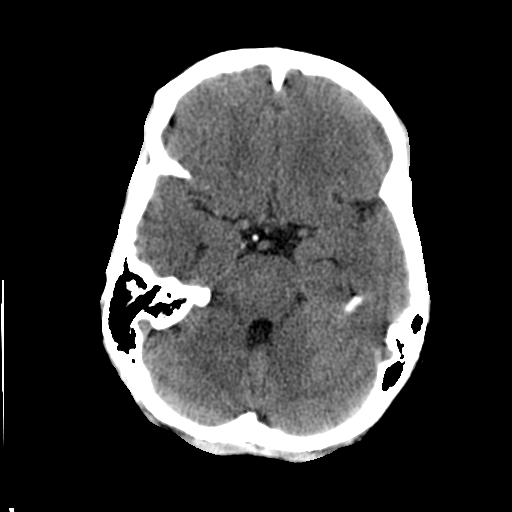
[im 9/32  bone]
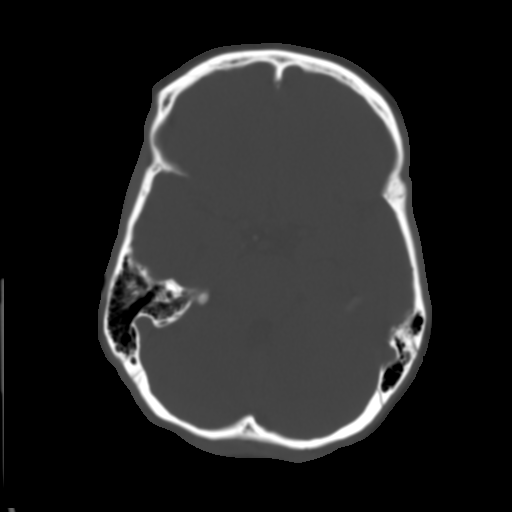
[im 11/32  brain]
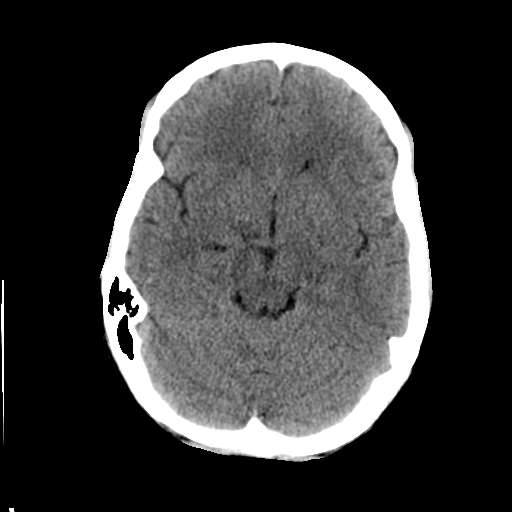
[im 13/32  brain]
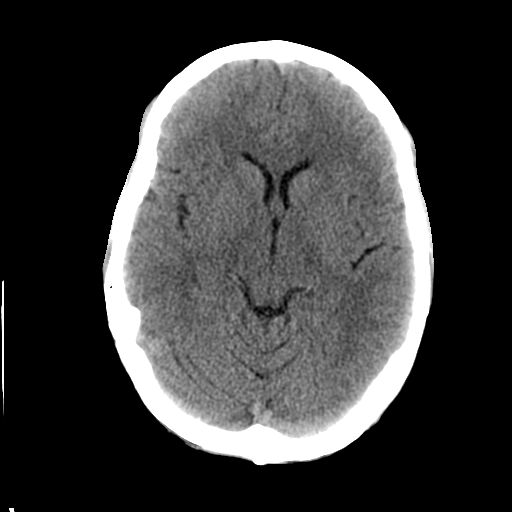
[im 15/32  brain]
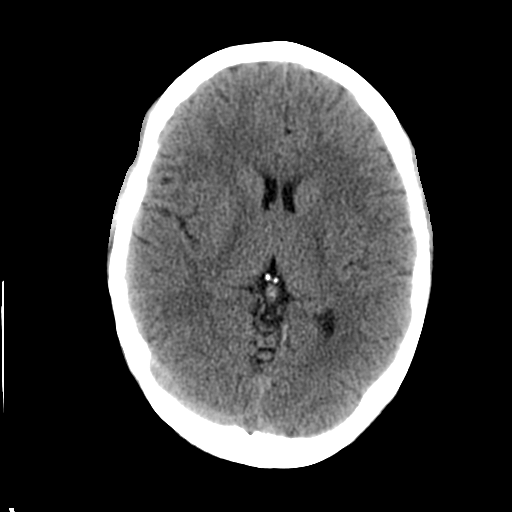
[im 17/32  brain]
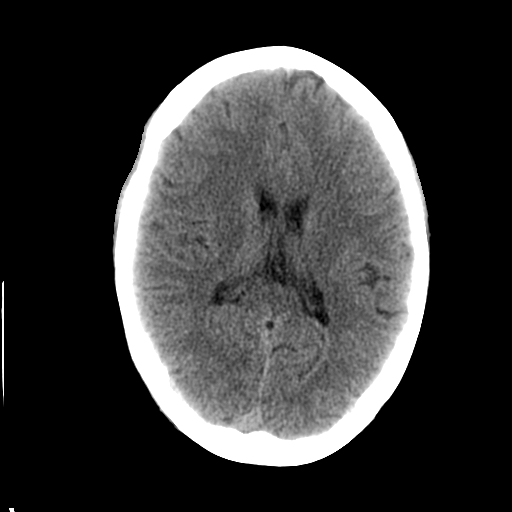
[im 17/32  bone]
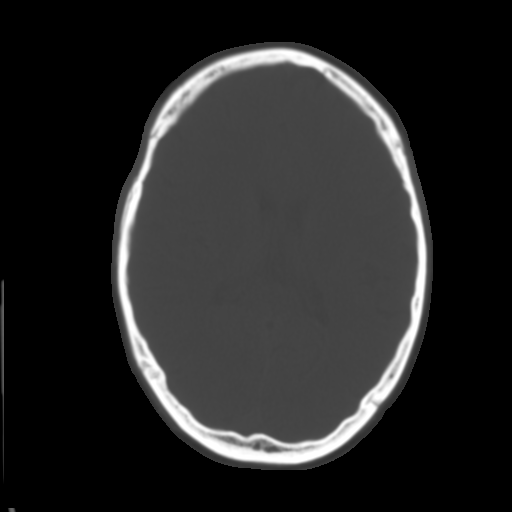
[im 19/32  brain]
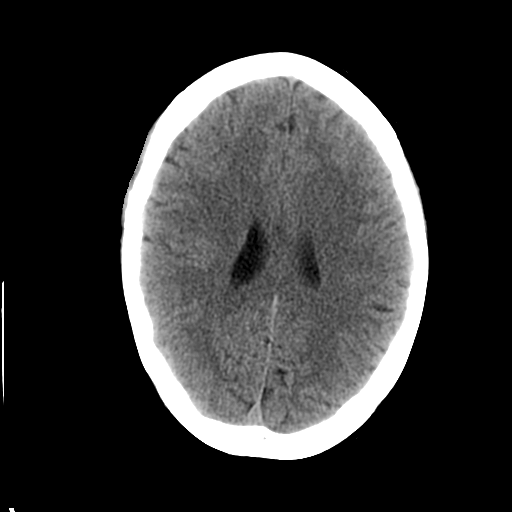
[im 21/32  brain]
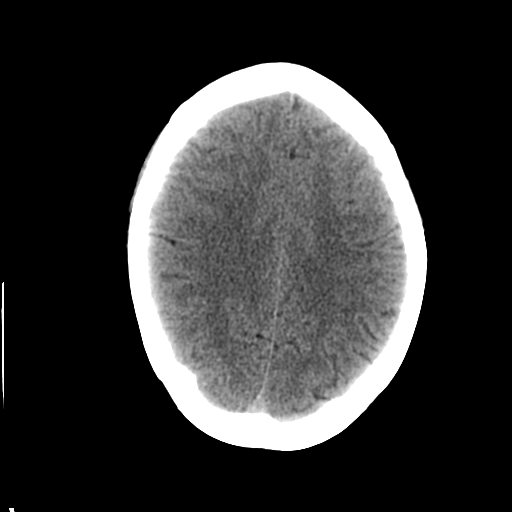
[im 23/32  brain]
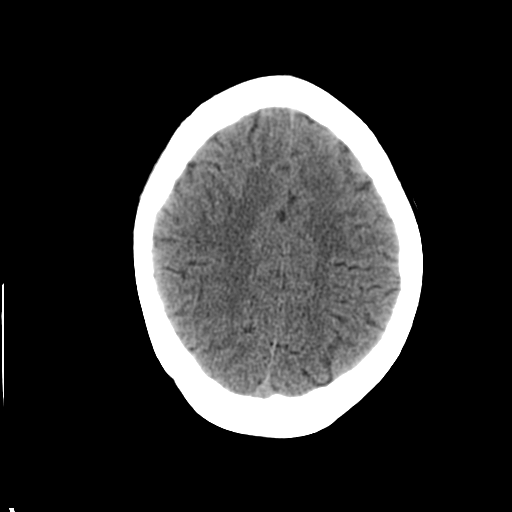
[im 24/32  brain]
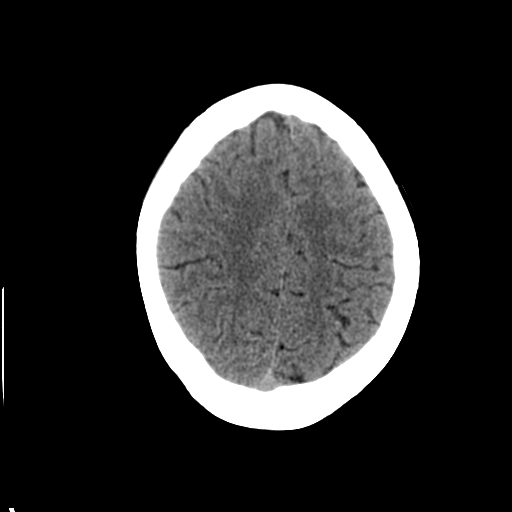
[im 24/32  bone]
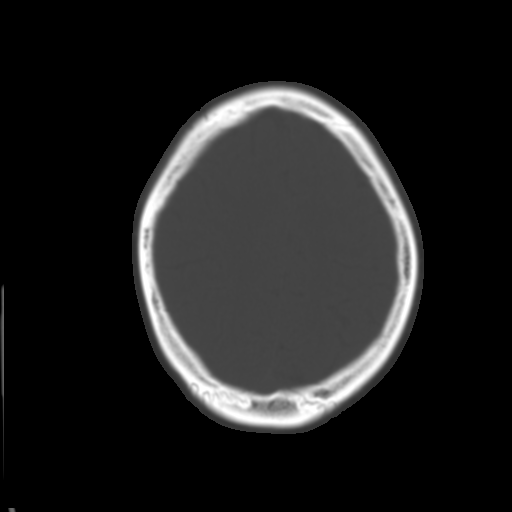
[im 26/32  brain]
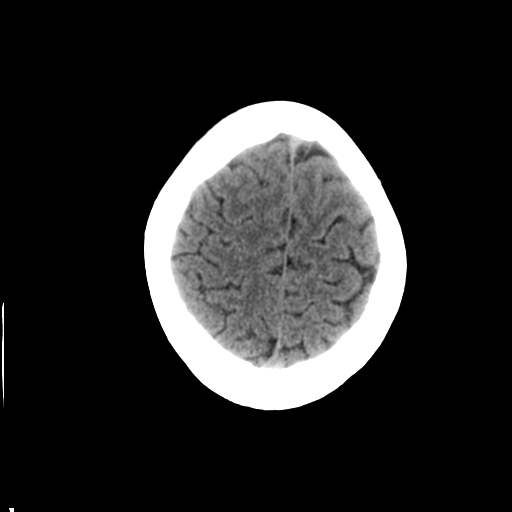
[im 28/32  brain]
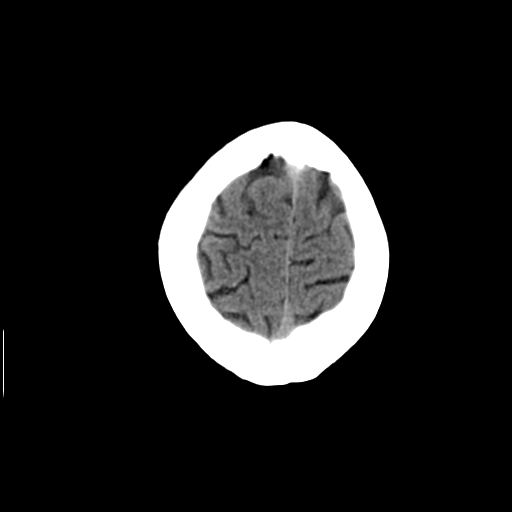
[im 30/32  brain]
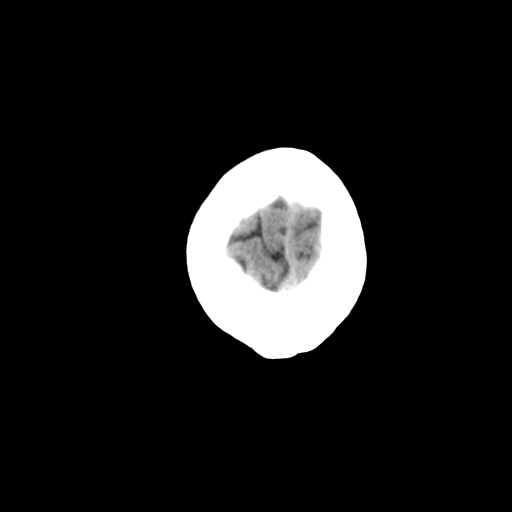

[16 of 30 positions shown; findings below may reference images not displayed]

FINDINGS: The brain has a normal appearance without evidence for
hemorrhage, infarction, hydrocephalus, or mass lesion.  There is no
extra axial fluid collection.

Fluid level is identified within the right maxillary sinus.  There
is partial opacification of the ethmoid air cells.

The skull is intact.
IMPRESSION: 1.  No acute intracranial abnormalities.
2.  Sinus inflammation.

## 2011-07-04 ENCOUNTER — Encounter: Payer: Self-pay | Admitting: Family Medicine

## 2011-07-26 ENCOUNTER — Ambulatory Visit (INDEPENDENT_AMBULATORY_CARE_PROVIDER_SITE_OTHER): Payer: BC Managed Care – PPO | Admitting: *Deleted

## 2011-07-26 DIAGNOSIS — Z309 Encounter for contraceptive management, unspecified: Secondary | ICD-10-CM

## 2011-07-26 MED ORDER — MEDROXYPROGESTERONE ACETATE 150 MG/ML IM SUSP
150.0000 mg | Freq: Once | INTRAMUSCULAR | Status: AC
  Administered 2011-07-26: 150 mg via INTRAMUSCULAR

## 2011-09-13 ENCOUNTER — Encounter: Payer: Self-pay | Admitting: Family Medicine

## 2011-09-14 ENCOUNTER — Ambulatory Visit (INDEPENDENT_AMBULATORY_CARE_PROVIDER_SITE_OTHER): Payer: BC Managed Care – PPO | Admitting: Family Medicine

## 2011-09-14 ENCOUNTER — Encounter: Payer: Self-pay | Admitting: Family Medicine

## 2011-09-14 VITALS — BP 110/60 | HR 116 | Temp 98.4°F | Ht 67.0 in | Wt 138.5 lb

## 2011-09-14 DIAGNOSIS — Z309 Encounter for contraceptive management, unspecified: Secondary | ICD-10-CM

## 2011-09-14 DIAGNOSIS — R55 Syncope and collapse: Secondary | ICD-10-CM

## 2011-09-14 DIAGNOSIS — Z113 Encounter for screening for infections with a predominantly sexual mode of transmission: Secondary | ICD-10-CM

## 2011-09-14 DIAGNOSIS — Z0000 Encounter for general adult medical examination without abnormal findings: Secondary | ICD-10-CM

## 2011-09-14 NOTE — Assessment & Plan Note (Signed)
STD screen. Nml GU exam. Continue depo for 1 more year.

## 2011-09-14 NOTE — Patient Instructions (Addendum)
Work on healthy lifestyle. Adequate sleep at night, 3 healthy meals a day, decrease stress.  Follow up in 1 year for physical.. Will have first pap smear next year.

## 2011-09-14 NOTE — Progress Notes (Signed)
Subjective:    Patient ID: Ariel Hale, female    DOB: 02-Apr-1991, 21 y.o.   MRN: [id number]  HPI The patient is here for annual wellness exam and preventative care.    No recent vasovagal spells. Last time was 05/2010. Still has some episodeds of heart pounding, dizziness. Had cardiology eval.. Felt it was vasovagal. Had neuro eval, nml CT scan of heah nml. Has been seenig endocrinologist (last seen 08/2011) note reviewed... nml parathyroid function per PTH and Ca.  Depo for for contraception, occ spotting but otherwise doing well. On now in last year. She was unable to take OCPs. Sexually active but not recently, last 1 year ago. Not using condoms.  Exercise: walks, swimming Diet: Good  . History   Social History  . Marital Status: Single    Spouse Name: N/A    Number of Children: N/A  . Years of Education: N/A   Occupational History  . Not on file.   Social History Main Topics  . Smoking status: Never Smoker   . Smokeless tobacco: Never Used  . Alcohol Use: No  . Drug Use: No  . Sexually Active: Yes -- Female partner(s)    Birth Control/ Protection: Injection     no condoms   Other Topics Concern  . Not on file   Social History Narrative  . No narrative on file  Taking the semester off from school.   Review of Systems  Constitutional: Negative for fever and fatigue.  HENT: Negative for ear pain.   Eyes: Negative for pain.  Respiratory: Negative for chest tightness and shortness of breath.   Cardiovascular: Negative for chest pain, palpitations and leg swelling.  Gastrointestinal: Negative for abdominal pain.  Genitourinary: Negative for dysuria.       Objective:   Physical Exam  Constitutional: Vital signs are normal. She appears well-developed and well-nourished. She is cooperative.  Non-toxic appearance. She does not appear ill. No distress.  HENT:  Head: Normocephalic.  Right Ear: Hearing, tympanic membrane, external ear and ear canal normal.    Left Ear: Hearing, tympanic membrane, external ear and ear canal normal.  Nose: Nose normal.  Eyes: Conjunctivae, EOM and lids are normal. Pupils are equal, round, and reactive to light. No foreign bodies found.  Neck: Trachea normal and normal range of motion. Neck supple. Carotid bruit is not present. No mass and no thyromegaly present.  Cardiovascular: Normal rate, regular rhythm, S1 normal, S2 normal, normal heart sounds and intact distal pulses.  Exam reveals no gallop.   No murmur heard. Pulmonary/Chest: Effort normal and breath sounds normal. No respiratory distress. She has no wheezes. She has no rhonchi. She has no rales.  Abdominal: Soft. Normal appearance and bowel sounds are normal. She exhibits no distension, no fluid wave, no abdominal bruit and no mass. There is no hepatosplenomegaly. There is no tenderness. There is no rebound, no guarding and no CVA tenderness. No hernia.  Genitourinary: Vagina normal. There is no rash, tenderness or lesion on the right labia. There is no rash, tenderness or lesion on the left labia. Cervix exhibits no motion tenderness, no discharge and no friability.       No pap  Lymphadenopathy:    She has no cervical adenopathy.    She has no axillary adenopathy.  Neurological: She is alert. She has normal strength. No cranial nerve deficit or sensory deficit.  Skin: Skin is warm, dry and intact. No rash noted.  Psychiatric: Her speech is normal and behavior  is normal. Judgment normal. Her mood appears not anxious. Cognition and memory are normal. She does not exhibit a depressed mood.          Assessment & Plan:  The patient's preventative maintenance and recommended screening tests for an annual wellness exam were reviewed in full today. Brought up to date unless services declined.  Counselled on the importance of diet, exercise, and its role in overall health and mortality. The patient's FH and SH was reviewed, including their home life, tobacco  status, and drug and alcohol status.   Vaccines:Uptodate with tetanus  PAP/DVE: not due yet STD screen: Routine. Nonsmoker

## 2011-09-14 NOTE — Assessment & Plan Note (Signed)
Full work up neg.

## 2011-09-15 LAB — GC/CHLAMYDIA PROBE AMP, GENITAL
Chlamydia, DNA Probe: NEGATIVE
GC Probe Amp, Genital: NEGATIVE

## 2011-09-15 LAB — HIV ANTIBODY (ROUTINE TESTING W REFLEX): HIV: NONREACTIVE

## 2011-09-17 ENCOUNTER — Encounter: Payer: Self-pay | Admitting: *Deleted

## 2011-10-19 ENCOUNTER — Ambulatory Visit: Payer: BC Managed Care – PPO

## 2011-10-24 ENCOUNTER — Ambulatory Visit: Payer: BC Managed Care – PPO

## 2011-10-25 ENCOUNTER — Ambulatory Visit (INDEPENDENT_AMBULATORY_CARE_PROVIDER_SITE_OTHER): Payer: BC Managed Care – PPO | Admitting: *Deleted

## 2011-10-25 DIAGNOSIS — Z309 Encounter for contraceptive management, unspecified: Secondary | ICD-10-CM

## 2011-10-25 MED ORDER — MEDROXYPROGESTERONE ACETATE 150 MG/ML IM SUSP
150.0000 mg | Freq: Once | INTRAMUSCULAR | Status: AC
  Administered 2011-10-25: 150 mg via INTRAMUSCULAR

## 2011-10-26 ENCOUNTER — Ambulatory Visit: Payer: BC Managed Care – PPO

## 2012-01-07 ENCOUNTER — Telehealth: Payer: Self-pay | Admitting: Family Medicine

## 2012-01-07 NOTE — Telephone Encounter (Signed)
Caller: [name]/Patient; Patient Name: Ariel Hale; PCP: Kerby Nora [facility]); Best Callback Phone Number: [phone]  01-07-12  callling to see when her last depo shot was , I verified in Epic it was 10-25-11.   she will call back tomorrow when office open to schedule her next shot.

## 2012-01-15 ENCOUNTER — Encounter: Payer: Self-pay | Admitting: Family Medicine

## 2012-01-15 ENCOUNTER — Ambulatory Visit (INDEPENDENT_AMBULATORY_CARE_PROVIDER_SITE_OTHER): Payer: BC Managed Care – PPO | Admitting: Family Medicine

## 2012-01-15 ENCOUNTER — Ambulatory Visit (INDEPENDENT_AMBULATORY_CARE_PROVIDER_SITE_OTHER): Payer: BC Managed Care – PPO | Admitting: *Deleted

## 2012-01-15 VITALS — BP 110/80 | HR 64 | Temp 98.5°F

## 2012-01-15 DIAGNOSIS — Z309 Encounter for contraceptive management, unspecified: Secondary | ICD-10-CM

## 2012-01-15 DIAGNOSIS — J029 Acute pharyngitis, unspecified: Secondary | ICD-10-CM | POA: Insufficient documentation

## 2012-01-15 MED ORDER — MEDROXYPROGESTERONE ACETATE 150 MG/ML IM SUSP
150.0000 mg | Freq: Once | INTRAMUSCULAR | Status: AC
  Administered 2012-01-15: 150 mg via INTRAMUSCULAR

## 2012-01-15 NOTE — Assessment & Plan Note (Signed)
High pre test prob for strep given throat  appearance and symptoms.  Rapid strep could be false negative. Will send in throat culture.

## 2012-01-15 NOTE — Progress Notes (Signed)
  Subjective:    Patient ID: Ariel Hale, female    DOB: 1990/12/11, 21 y.o.   MRN: [id number]  Sore Throat  This is a new problem. The current episode started in the past 7 days (3 days ). Associated symptoms include congestion and coughing. Pertinent negatives include no ear pain, headaches, shortness of breath, trouble swallowing or vomiting. Associated symptoms comments: Sneezing, mildly itchy eyes Sore throat, mild cough. She has had no exposure to strep or mono. She has tried NSAIDs for the symptoms. The treatment provided mild relief.      Review of Systems  HENT: Positive for congestion. Negative for ear pain and trouble swallowing.   Respiratory: Positive for cough. Negative for shortness of breath.   Gastrointestinal: Negative for vomiting.  Neurological: Negative for headaches.       Objective:   Physical Exam  Constitutional: She appears well-developed and well-nourished.  HENT:  Head: Normocephalic.  Right Ear: External ear normal. Tympanic membrane is not injected. No middle ear effusion.  Left Ear: External ear normal. Tympanic membrane is not injected.  No middle ear effusion.  Nose: No mucosal edema or rhinorrhea. Right sinus exhibits no maxillary sinus tenderness and no frontal sinus tenderness. Left sinus exhibits no maxillary sinus tenderness and no frontal sinus tenderness.  Mouth/Throat: Uvula is midline and mucous membranes are normal. Oropharyngeal exudate, posterior oropharyngeal edema and posterior oropharyngeal erythema present. No tonsillar abscesses.  Neck: Normal range of motion.  Cardiovascular: Normal rate, regular rhythm, normal heart sounds and intact distal pulses.   No murmur heard. Pulmonary/Chest: Effort normal and breath sounds normal. She has no rales.  Abdominal: Soft. Bowel sounds are normal.  Lymphadenopathy:    She has cervical adenopathy.       Right cervical: Superficial cervical adenopathy present.       Left cervical:  Superficial cervical adenopathy present.          Assessment & Plan:

## 2012-01-15 NOTE — Patient Instructions (Addendum)
We will call with throat culture results.  Push fluids. Tylenol or iburofen for throat pain and body-aches.

## 2012-01-16 ENCOUNTER — Telehealth: Payer: Self-pay

## 2012-01-16 NOTE — Telephone Encounter (Signed)
Pt request call back when throat culture report available.

## 2012-01-18 ENCOUNTER — Telehealth: Payer: Self-pay | Admitting: Family Medicine

## 2012-01-18 LAB — CULTURE, GROUP A STREP

## 2012-01-18 MED ORDER — AMOXICILLIN 500 MG PO CAPS: 1000.0000 mg | ORAL_CAPSULE | Freq: Two times a day (BID) | ORAL | Status: DC

## 2012-01-18 NOTE — Telephone Encounter (Signed)
Notify pt that there was strep that grew on culture.. Will send in course of amoxicillin.

## 2012-01-18 NOTE — Telephone Encounter (Signed)
Patient notified as instructed by telephone. 

## 2012-02-04 NOTE — Telephone Encounter (Signed)
See 01-18-12 note.

## 2012-04-15 ENCOUNTER — Ambulatory Visit (INDEPENDENT_AMBULATORY_CARE_PROVIDER_SITE_OTHER): Payer: BC Managed Care – PPO | Admitting: *Deleted

## 2012-04-15 DIAGNOSIS — Z309 Encounter for contraceptive management, unspecified: Secondary | ICD-10-CM

## 2012-04-15 MED ORDER — MEDROXYPROGESTERONE ACETATE 150 MG/ML IM SUSP
150.0000 mg | Freq: Once | INTRAMUSCULAR | Status: AC
  Administered 2012-04-15: 150 mg via INTRAMUSCULAR

## 2012-05-12 ENCOUNTER — Ambulatory Visit (INDEPENDENT_AMBULATORY_CARE_PROVIDER_SITE_OTHER): Payer: BC Managed Care – PPO | Admitting: Family Medicine

## 2012-05-12 ENCOUNTER — Encounter: Payer: Self-pay | Admitting: Family Medicine

## 2012-05-12 VITALS — BP 100/64 | HR 82 | Temp 98.5°F | Ht 67.0 in | Wt 140.8 lb

## 2012-05-12 DIAGNOSIS — H1030 Unspecified acute conjunctivitis, unspecified eye: Secondary | ICD-10-CM

## 2012-05-12 MED ORDER — POLYMYXIN B-TRIMETHOPRIM 10000-0.1 UNIT/ML-% OP SOLN: 1.0000 [drp] | OPHTHALMIC | Status: DC

## 2012-05-12 NOTE — Progress Notes (Signed)
   [facility] [address] [place] [address] Phone: [phone] Fax: [phone]  Date:  05/12/2012   Name:  Ariel Hale   DOB:  1990-06-22   MRN:  [id number] Gender: female Age: 22 y.o.  Primary Physician:  Ariel Nora, MD  Evaluating MD: Ariel Beat, MD   Chief Complaint: Conjunctivitis   History of Present Illness:  Ariel Hale is a 22 y.o. 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  At [employer] now  SUBJECTIVE:  22 y.o. female with burning, redness, discharge and mattering in both eyes for 3 days - closing in the morning, started on the R.  No other symptoms.  No significant prior ophthalmological history. No change in visual acuity, no photophobia, no severe eye pain.  Patient Active Problem List  Diagnosis  . VASOVAGAL SYNCOPE  . UNDERWEIGHT  . HEADACHE  . DEPRESSION/ANXIETY  . OLIGOMENORRHEA  . NECK PAIN, ACUTE  . Contraception management  . Acute pharyngitis    Past Medical History  Diagnosis Date  . Vasovagal syncope     No past surgical history on file.  History  Substance Use Topics  . Smoking status: Never Smoker   . Smokeless tobacco: Never Used  . Alcohol Use: No    Family History  Problem Relation Age of Onset  . Osteoporosis Maternal Grandmother   . Diabetes Maternal Grandfather     Allergies  Allergen Reactions  . Hydrocodone-Acetaminophen     REACTION: nausea    Current Outpatient Prescriptions on File Prior to Visit  Medication Sig Dispense Refill  . medroxyPROGESTERone (DEPO-PROVERA) 150 MG/ML injection Inject 150 mg into the muscle every 3 (three) months.         OBJECTIVE:  Patient appears well, vitals signs are normal. Eyes: both eyes with findings of typical conjunctivitis noted; erythema and discharge. PERRLA, no foreign body noted. No periorbital cellulitis. The corneas are clear and fundi normal. Visual acuity normal.    ASSESSMENT:  Conjunctivitis - probably viral cannot exclude bacterial  PLAN:  Antibiotic drops per order. Hygiene discussed. If other family members develop same condition, may use same medication for them if they are not known to be allergic to it. Call prn.   Signed, Ariel Galea. [name], MD 05/12/2012 3:47 PM

## 2012-07-08 ENCOUNTER — Ambulatory Visit: Payer: BC Managed Care – PPO

## 2012-07-09 ENCOUNTER — Ambulatory Visit: Payer: BC Managed Care – PPO

## 2012-07-10 ENCOUNTER — Ambulatory Visit (INDEPENDENT_AMBULATORY_CARE_PROVIDER_SITE_OTHER): Payer: BC Managed Care – PPO | Admitting: *Deleted

## 2012-07-10 DIAGNOSIS — Z309 Encounter for contraceptive management, unspecified: Secondary | ICD-10-CM

## 2012-07-10 MED ORDER — MEDROXYPROGESTERONE ACETATE 150 MG/ML IM SUSP
150.0000 mg | Freq: Once | INTRAMUSCULAR | Status: AC
  Administered 2012-07-10: 150 mg via INTRAMUSCULAR

## 2012-09-09 ENCOUNTER — Other Ambulatory Visit: Payer: BC Managed Care – PPO

## 2012-09-09 ENCOUNTER — Telehealth: Payer: Self-pay | Admitting: Family Medicine

## 2012-09-09 NOTE — Telephone Encounter (Signed)
Message copied by Excell Seltzer on Tue Sep 09, 2012  8:28 AM ------      Message from: [name]      Created: Wed Aug 27, 2012  4:19 PM      Regarding: Lab orders for Tuesday,6.3.14       Patient is scheduled for CPX labs, please order future labs, Thanks , Ariel Hale       ------

## 2012-09-09 NOTE — Telephone Encounter (Signed)
No labs indicated unless she wants STD testing.

## 2012-09-16 ENCOUNTER — Encounter: Payer: BC Managed Care – PPO | Admitting: Family Medicine

## 2012-09-23 ENCOUNTER — Encounter: Payer: BC Managed Care – PPO | Admitting: Family Medicine

## 2012-09-30 ENCOUNTER — Telehealth: Payer: Self-pay | Admitting: Family Medicine

## 2012-09-30 NOTE — Telephone Encounter (Signed)
Message copied by Excell Seltzer on Tue Sep 30, 2012  5:36 PM ------      Message from: Baldomero Lamy      Created: Fri Sep 19, 2012  9:32 AM      Regarding: Cpx Labs Wed 6/25       Please order  future cpx labs for pt's upcoming lab appt.      Thanks      Tasha       ------

## 2012-09-30 NOTE — Telephone Encounter (Signed)
No labs indicated except STD screening.. Will discuss with pt at upcoming OV.

## 2012-10-01 ENCOUNTER — Ambulatory Visit (INDEPENDENT_AMBULATORY_CARE_PROVIDER_SITE_OTHER): Payer: BC Managed Care – PPO | Admitting: *Deleted

## 2012-10-01 ENCOUNTER — Other Ambulatory Visit: Payer: BC Managed Care – PPO

## 2012-10-01 DIAGNOSIS — IMO0001 Reserved for inherently not codable concepts without codable children: Secondary | ICD-10-CM

## 2012-10-01 DIAGNOSIS — Z309 Encounter for contraceptive management, unspecified: Secondary | ICD-10-CM

## 2012-10-01 MED ORDER — MEDROXYPROGESTERONE ACETATE 150 MG/ML IM SUSP
150.0000 mg | Freq: Once | INTRAMUSCULAR | Status: AC
  Administered 2012-10-01: 150 mg via INTRAMUSCULAR

## 2012-10-03 ENCOUNTER — Other Ambulatory Visit: Payer: BC Managed Care – PPO

## 2012-10-08 ENCOUNTER — Ambulatory Visit (INDEPENDENT_AMBULATORY_CARE_PROVIDER_SITE_OTHER): Payer: BC Managed Care – PPO | Admitting: Family Medicine

## 2012-10-08 ENCOUNTER — Encounter: Payer: Self-pay | Admitting: Family Medicine

## 2012-10-08 ENCOUNTER — Other Ambulatory Visit (HOSPITAL_COMMUNITY)
Admission: RE | Admit: 2012-10-08 | Discharge: 2012-10-08 | Disposition: A | Discharge status: Home / Self Care | Payer: BC Managed Care – PPO | Source: Ambulatory Visit | Attending: Family Medicine | Admitting: Family Medicine

## 2012-10-08 VITALS — BP 120/78 | HR 89 | Temp 98.4°F | Ht 67.0 in | Wt 136.5 lb

## 2012-10-08 DIAGNOSIS — Z113 Encounter for screening for infections with a predominantly sexual mode of transmission: Secondary | ICD-10-CM | POA: Insufficient documentation

## 2012-10-08 DIAGNOSIS — J029 Acute pharyngitis, unspecified: Secondary | ICD-10-CM

## 2012-10-08 DIAGNOSIS — Z0000 Encounter for general adult medical examination without abnormal findings: Secondary | ICD-10-CM

## 2012-10-08 DIAGNOSIS — Z309 Encounter for contraceptive management, unspecified: Secondary | ICD-10-CM

## 2012-10-08 DIAGNOSIS — Z01419 Encounter for gynecological examination (general) (routine) without abnormal findings: Secondary | ICD-10-CM

## 2012-10-08 MED ORDER — ETONOGESTREL-ETHINYL ESTRADIOL 0.12-0.015 MG/24HR VA RING: VAGINAL_RING | VAGINAL | Status: DC

## 2012-10-08 NOTE — Assessment & Plan Note (Addendum)
Continue depo as new recommendation about bone density suggest no duration limitation.

## 2012-10-08 NOTE — Progress Notes (Signed)
HPI  The patient is here for annual wellness exam and preventative care.   No recent vasovagal spells. Last time was 05/2010.  Still has some episodeds of heart pounding, dizziness if skipping meals Had cardiology eval.. Felt it was vasovagal.  Had neuro eval, nml  CT scan of head nml.  Has been seeing endocrinologist (last seen 08/2011) note reviewed... nml parathyroid function per PTH and Ca.   Depo for for contraception, occ spotting but otherwise doing well.  Has been on this now for 2 years. She was unable to take OCPs, could not remember. Her last Depo was in the last week. Sexually active but not recently, last more than a year ago. Not using condoms.   Exercise: walks, swimming  Diet: Good   She is back in school in last year. Doing well.  Denies depression and anxiety.  She has had some sore throat since Saturday. Exposure to strep .Marland Kitchen She requests strep test.  Review of Systems  Constitutional: Negative for fever and fatigue.  HENT: Negative for ear pain.  Eyes: Negative for pain.  Respiratory: Negative for chest tightness and shortness of breath.  Cardiovascular: Negative for chest pain, palpitations and leg swelling.  Gastrointestinal: Negative for abdominal pain.  Genitourinary: Negative for dysuria.  Objective:   Physical Exam  Constitutional: Vital signs are normal. She appears well-developed and well-nourished. She is cooperative. Non-toxic appearance. She does not appear ill. No distress.  HENT:  Head: Normocephalic.  Right Ear: Hearing, tympanic membrane, external ear and ear canal normal.  Left Ear: Hearing, tympanic membrane, external ear and ear canal normal.  Nose: Nose normal.  Eyes: Conjunctivae, EOM and lids are normal. Pupils are equal, round, and reactive to light. No foreign bodies found.  Neck: Trachea normal and normal range of motion. Neck supple. Carotid bruit is not present. No mass and no thyromegaly present.  Cardiovascular: Normal rate,  regular rhythm, S1 normal, S2 normal, normal heart sounds and intact distal pulses. Exam reveals no gallop.  No murmur heard.  Pulmonary/Chest: Effort normal and breath sounds normal. No respiratory distress. She has no wheezes. She has no rhonchi. She has no rales.  Abdominal: Soft. Normal appearance and bowel sounds are normal. She exhibits no distension, no fluid wave, no abdominal bruit and no mass. There is no hepatosplenomegaly. There is no tenderness. There is no rebound, no guarding and no CVA tenderness. No hernia.  Genitourinary: Vagina normal. There is no rash, tenderness or lesion on the right labia. There is no rash, tenderness or lesion on the left labia. Cervix exhibits no motion tenderness, no discharge and no friability.  PAP performed Breast exam: nml, no masses Lymphadenopathy:  She has no cervical adenopathy.  She has no axillary adenopathy.  Neurological: She is alert. She has normal strength. No cranial nerve deficit or sensory deficit.  Skin: Skin is warm, dry and intact. No rash noted.  Psychiatric: Her speech is normal and behavior is normal. Judgment normal. Her mood appears not anxious. Cognition and memory are normal. She does not exhibit a depressed mood.  Assessment & Plan:   The patient's preventative maintenance and recommended screening tests for an annual wellness exam were reviewed in full today.  Brought up to date unless services declined.  Counselled on the importance of diet, exercise, and its role in overall health and mortality.  The patient's FH and SH was reviewed, including their home life, tobacco status, and drug and alcohol status.   Vaccines:Uptodate with tetanus  PAP/DVE:  completed STD screen: Not interested given not sexually active in over 1 year.  Nonsmoker

## 2012-10-08 NOTE — Patient Instructions (Addendum)
Continue on DEPO. No need to fill nuvaring. Follow up in 1 year.

## 2012-10-14 ENCOUNTER — Encounter: Payer: Self-pay | Admitting: *Deleted

## 2012-12-26 ENCOUNTER — Ambulatory Visit (INDEPENDENT_AMBULATORY_CARE_PROVIDER_SITE_OTHER): Payer: BC Managed Care – PPO | Admitting: *Deleted

## 2012-12-26 DIAGNOSIS — Z309 Encounter for contraceptive management, unspecified: Secondary | ICD-10-CM

## 2012-12-26 MED ORDER — MEDROXYPROGESTERONE ACETATE 150 MG/ML IM SUSP
150.0000 mg | Freq: Once | INTRAMUSCULAR | Status: AC
  Administered 2012-12-26: 150 mg via INTRAMUSCULAR

## 2013-03-25 ENCOUNTER — Ambulatory Visit (INDEPENDENT_AMBULATORY_CARE_PROVIDER_SITE_OTHER): Payer: BC Managed Care – PPO

## 2013-03-25 DIAGNOSIS — Z309 Encounter for contraceptive management, unspecified: Secondary | ICD-10-CM

## 2013-03-25 MED ORDER — MEDROXYPROGESTERONE ACETATE 150 MG/ML IM SUSP
150.0000 mg | Freq: Once | INTRAMUSCULAR | Status: AC
  Administered 2013-03-25: 150 mg via INTRAMUSCULAR

## 2013-06-10 ENCOUNTER — Ambulatory Visit (INDEPENDENT_AMBULATORY_CARE_PROVIDER_SITE_OTHER): Payer: BC Managed Care – PPO

## 2013-06-10 DIAGNOSIS — Z309 Encounter for contraceptive management, unspecified: Secondary | ICD-10-CM

## 2013-06-10 MED ORDER — MEDROXYPROGESTERONE ACETATE 150 MG/ML IM SUSP
150.0000 mg | Freq: Once | INTRAMUSCULAR | Status: AC
  Administered 2013-06-10: 150 mg via INTRAMUSCULAR

## 2013-08-25 ENCOUNTER — Telehealth: Payer: Self-pay

## 2013-08-25 NOTE — Telephone Encounter (Signed)
Pt request next depo appt; pt already scheduled for 09/02/13 at 3:30 p.m. Pt voiced understanding.

## 2013-09-02 ENCOUNTER — Ambulatory Visit (INDEPENDENT_AMBULATORY_CARE_PROVIDER_SITE_OTHER): Payer: BC Managed Care – PPO

## 2013-09-02 DIAGNOSIS — Z309 Encounter for contraceptive management, unspecified: Secondary | ICD-10-CM

## 2013-09-02 MED ORDER — MEDROXYPROGESTERONE ACETATE 150 MG/ML IM SUSP
150.0000 mg | Freq: Once | INTRAMUSCULAR | Status: AC
  Administered 2013-09-02: 150 mg via INTRAMUSCULAR

## 2013-11-18 ENCOUNTER — Ambulatory Visit: Payer: BC Managed Care – PPO

## 2013-11-27 ENCOUNTER — Encounter (INDEPENDENT_AMBULATORY_CARE_PROVIDER_SITE_OTHER): Payer: Self-pay

## 2013-11-27 ENCOUNTER — Ambulatory Visit (INDEPENDENT_AMBULATORY_CARE_PROVIDER_SITE_OTHER): Payer: BC Managed Care – PPO | Admitting: Family Medicine

## 2013-11-27 ENCOUNTER — Other Ambulatory Visit (HOSPITAL_COMMUNITY)
Admission: RE | Admit: 2013-11-27 | Discharge: 2013-11-27 | Disposition: A | Discharge status: Home / Self Care | Payer: BC Managed Care – PPO | Source: Ambulatory Visit | Attending: Family Medicine | Admitting: Family Medicine

## 2013-11-27 ENCOUNTER — Encounter: Payer: Self-pay | Admitting: Family Medicine

## 2013-11-27 VITALS — BP 108/80 | HR 88 | Temp 98.7°F | Ht 67.0 in | Wt 150.0 lb

## 2013-11-27 DIAGNOSIS — Z113 Encounter for screening for infections with a predominantly sexual mode of transmission: Secondary | ICD-10-CM | POA: Insufficient documentation

## 2013-11-27 DIAGNOSIS — Z124 Encounter for screening for malignant neoplasm of cervix: Secondary | ICD-10-CM | POA: Insufficient documentation

## 2013-11-27 DIAGNOSIS — N926 Irregular menstruation, unspecified: Secondary | ICD-10-CM

## 2013-11-27 DIAGNOSIS — G43009 Migraine without aura, not intractable, without status migrainosus: Secondary | ICD-10-CM | POA: Insufficient documentation

## 2013-11-27 DIAGNOSIS — Z1151 Encounter for screening for human papillomavirus (HPV): Secondary | ICD-10-CM | POA: Diagnosis present

## 2013-11-27 DIAGNOSIS — Z0000 Encounter for general adult medical examination without abnormal findings: Secondary | ICD-10-CM

## 2013-11-27 DIAGNOSIS — R5383 Other fatigue: Secondary | ICD-10-CM | POA: Insufficient documentation

## 2013-11-27 DIAGNOSIS — Z309 Encounter for contraceptive management, unspecified: Secondary | ICD-10-CM

## 2013-11-27 DIAGNOSIS — G43909 Migraine, unspecified, not intractable, without status migrainosus: Secondary | ICD-10-CM

## 2013-11-27 DIAGNOSIS — R87810 Cervical high risk human papillomavirus (HPV) DNA test positive: Secondary | ICD-10-CM | POA: Insufficient documentation

## 2013-11-27 DIAGNOSIS — R5381 Other malaise: Secondary | ICD-10-CM

## 2013-11-27 DIAGNOSIS — F331 Major depressive disorder, recurrent, moderate: Secondary | ICD-10-CM

## 2013-11-27 DIAGNOSIS — F339 Major depressive disorder, recurrent, unspecified: Secondary | ICD-10-CM | POA: Insufficient documentation

## 2013-11-27 LAB — POCT URINE PREGNANCY: Preg Test, Ur: NEGATIVE

## 2013-11-27 MED ORDER — FLUOXETINE HCL 20 MG PO TABS
20.0000 mg | ORAL_TABLET | Freq: Every day | ORAL | Status: DC
Start: 2013-11-27 — End: 2014-01-12

## 2013-11-27 MED ORDER — MEDROXYPROGESTERONE ACETATE 150 MG/ML IM SUSP
150.0000 mg | Freq: Once | INTRAMUSCULAR | Status: AC
  Administered 2013-11-27: 150 mg via INTRAMUSCULAR

## 2013-11-27 NOTE — Progress Notes (Signed)
The patient is here for annual wellness exam and preventative care.   No recent vasovagal spells. Last time was 05/2010.  Still has some episodeds of heart pounding, dizziness if skipping meals  Had cardiology eval.. Felt it was vasovagal.  Had neuro eval, nml  CT scan of head nml.  Has been seeing endocrinologist (last seen 08/2011) note reviewed... nml parathyroid function per PTH and Ca.   She continues to have migraines ( occuring every few weeks, last 3 days),. She feels very fatigued overall ( no heavy bleeding)  She has intermittent chest pain going to her neck.. Last a few minutes  She has two jobs, just graduated. Increased stress. She reports depression, she feels blah all the time. ONgoing for few months. She is sleeping at night. She naps a lot too.  She has considered counseling, she has tried depression med in past, not sure which one, ? SE nausea.  Depo for for contraception, occ spotting but otherwise doing well. Has been on this now for 2 years. She was unable to take OCPs, could not remember.   Sexually active now. She is now engaged..  Exercise: walks, swimming  Diet: Good   Review of Systems  Constitutional: Negative for fever HENT: Negative for ear pain.  Eyes: Negative for pain.  Respiratory: Negative for chest tightness and shortness of breath.  Cardiovascular: Negative for chest pain, palpitations and leg swelling.  Gastrointestinal: Negative for abdominal pain.  Genitourinary: Negative for dysuria.  Objective:   Physical Exam  Constitutional: Vital signs are normal. She appears well-developed and well-nourished. She is cooperative. Non-toxic appearance. She does not appear ill. No distress.  HENT:  Head: Normocephalic.  Right Ear: Hearing, tympanic membrane, external ear and ear canal normal.  Left Ear: Hearing, tympanic membrane, external ear and ear canal normal.  Nose: Nose normal.  Eyes: Conjunctivae, EOM and lids are normal. Pupils are equal,  round, and reactive to light. No foreign bodies found.  Neck: Trachea normal and normal range of motion. Neck supple. Carotid bruit is not present. No mass and no thyromegaly present.  Cardiovascular: Normal rate, regular rhythm, S1 normal, S2 normal, normal heart sounds and intact distal pulses. Exam reveals no gallop.  No murmur heard.  Pulmonary/Chest: Effort normal and breath sounds normal. No respiratory distress. She has no wheezes. She has no rhonchi. She has no rales.  Abdominal: Soft. Normal appearance and bowel sounds are normal. She exhibits no distension, no fluid wave, no abdominal bruit and no mass. There is no hepatosplenomegaly. There is no tenderness. There is no rebound, no guarding and no CVA tenderness. No hernia.  Genitourinary: Vagina normal. There is no rash, tenderness or lesion on the right labia. There is no rash, tenderness or lesion on the left labia. Cervix exhibits no motion tenderness, no discharge and no friability.  PAP performed  Breast exam: nml, no masses Lymphadenopathy:  She has no cervical adenopathy.  She has no axillary adenopathy.  Neurological: She is alert. She has normal strength. No cranial nerve deficit or sensory deficit.  Skin: Skin is warm, dry and intact. No rash noted.  Psychiatric: Her speech is normal and behavior is normal. Judgment normal. Her mood appears not anxious. Cognition and memory are normal. She does not exhibit a depressed mood.  Assessment & Plan:   The patient's preventative maintenance and recommended screening tests for an annual wellness exam were reviewed in full today.  Brought up to date unless services declined.  Counselled on the importance  of diet, exercise, and its role in overall health and mortality.  The patient's FH and SH was reviewed, including their home life, tobacco status, and drug and alcohol status.   Vaccines:Uptodate with tetanus  PAP/DVE: completed  STD screen:Will do. Nonsmoker

## 2013-11-27 NOTE — Assessment & Plan Note (Addendum)
U preg neg Likely from Depo

## 2013-11-27 NOTE — Assessment & Plan Note (Signed)
MAy improve with SSRI and mood. Follow up.

## 2013-11-27 NOTE — Assessment & Plan Note (Signed)
Start prozac, refer to counselor.  Follow up in 1 month.

## 2013-11-27 NOTE — Patient Instructions (Addendum)
Start fluoxetine at bedtime. Stop at front desk to set up referral to counselor. Follow up in 1 month for mood, headache. Stop at lab on way out.

## 2013-11-27 NOTE — Progress Notes (Signed)
Pre visit review using our clinic review tool, if applicable. No additional management support is needed unless otherwise documented below in the visit note. 

## 2013-11-28 LAB — CBC WITH DIFFERENTIAL/PLATELET
Basophils Absolute: 0.1 10*3/uL (ref 0.0–0.1)
Basophils Relative: 1 % (ref 0.0–3.0)
Eosinophils Absolute: 0.2 10*3/uL (ref 0.0–0.7)
Eosinophils Relative: 4.2 % (ref 0.0–5.0)
HCT: 45 % (ref 36.0–46.0)
Hemoglobin: 15.1 g/dL — ABNORMAL HIGH (ref 12.0–15.0)
Lymphocytes Relative: 44.6 % (ref 12.0–46.0)
Lymphs Abs: 2.4 10*3/uL (ref 0.7–4.0)
MCHC: 33.7 g/dL (ref 30.0–36.0)
MCV: 92.3 fl (ref 78.0–100.0)
Monocytes Absolute: 0.6 10*3/uL (ref 0.1–1.0)
Monocytes Relative: 11.2 % (ref 3.0–12.0)
Neutro Abs: 2.1 10*3/uL (ref 1.4–7.7)
Neutrophils Relative %: 39 % — ABNORMAL LOW (ref 43.0–77.0)
Platelets: 261 10*3/uL (ref 150.0–400.0)
RBC: 4.87 Mil/uL (ref 3.87–5.11)
RDW: 12.6 % (ref 11.5–15.5)
WBC: 5.5 10*3/uL (ref 4.0–10.5)

## 2013-11-28 LAB — VITAMIN B12: VITAMIN B 12: 305 pg/mL (ref 211–911)

## 2013-11-28 LAB — VITAMIN D 25 HYDROXY (VIT D DEFICIENCY, FRACTURES): VITD: 19.46 ng/mL — ABNORMAL LOW (ref 30.00–100.00)

## 2013-11-28 LAB — TSH: TSH: 1.33 u[IU]/mL (ref 0.35–4.50)

## 2013-11-30 MED ORDER — VITAMIN D (ERGOCALCIFEROL) 1.25 MG (50000 UNIT) PO CAPS: 50000.0000 [IU] | ORAL_CAPSULE | ORAL | Status: DC

## 2013-11-30 NOTE — Addendum Note (Signed)
Addended by: Damita Lack on: 11/30/2013 09:35 AM   Modules accepted: Orders

## 2013-12-03 LAB — CYTOLOGY - PAP

## 2013-12-04 ENCOUNTER — Telehealth: Payer: Self-pay | Admitting: Family Medicine

## 2013-12-04 DIAGNOSIS — R87612 Low grade squamous intraepithelial lesion on cytologic smear of cervix (LGSIL): Secondary | ICD-10-CM

## 2013-12-04 DIAGNOSIS — N870 Mild cervical dysplasia: Secondary | ICD-10-CM

## 2013-12-04 DIAGNOSIS — R87619 Unspecified abnormal cytological findings in specimens from cervix uteri: Secondary | ICD-10-CM | POA: Insufficient documentation

## 2013-12-04 NOTE — Telephone Encounter (Signed)
Pt.notified

## 2013-12-04 NOTE — Telephone Encounter (Signed)
Called pt to discuss results in detail. Questions anwer. She is agreeeable with referral to GYN.

## 2013-12-29 ENCOUNTER — Ambulatory Visit: Payer: BC Managed Care – PPO | Admitting: Family Medicine

## 2014-01-12 ENCOUNTER — Ambulatory Visit (INDEPENDENT_AMBULATORY_CARE_PROVIDER_SITE_OTHER): Payer: BC Managed Care – PPO | Admitting: Family Medicine

## 2014-01-12 ENCOUNTER — Encounter: Payer: Self-pay | Admitting: Family Medicine

## 2014-01-12 VITALS — BP 100/60 | HR 85 | Temp 98.2°F | Ht 67.0 in | Wt 149.0 lb

## 2014-01-12 DIAGNOSIS — F331 Major depressive disorder, recurrent, moderate: Secondary | ICD-10-CM

## 2014-01-12 DIAGNOSIS — G43009 Migraine without aura, not intractable, without status migrainosus: Secondary | ICD-10-CM

## 2014-01-12 DIAGNOSIS — N870 Mild cervical dysplasia: Secondary | ICD-10-CM

## 2014-01-12 MED ORDER — RIZATRIPTAN BENZOATE 10 MG PO TBDP: 10.0000 mg | ORAL_TABLET | ORAL | Status: DC | PRN

## 2014-01-12 MED ORDER — FLUOXETINE HCL 20 MG PO TABS: 40.0000 mg | ORAL_TABLET | Freq: Every day | ORAL | Status: DC

## 2014-01-12 NOTE — Assessment & Plan Note (Signed)
Improving but inadequate control. Increase fluoxetine to 40 mg daily.  Follow up in 1 month. COntinue counseling.

## 2014-01-12 NOTE — Progress Notes (Signed)
Pre visit review using our clinic review tool, if applicable. No additional management support is needed unless otherwise documented below in the visit note. 

## 2014-01-12 NOTE — Assessment & Plan Note (Signed)
Wil get records from GYN. Pt will consider gardasil vaccine.

## 2014-01-12 NOTE — Patient Instructions (Addendum)
Increase fluoxetine to 2 tabs daily. Increase water, sleep well, don't go > 5 hours without food. Can use maxalt MLT for severe migraine. Call if any SE. Follow up in 1 month. Call insurance and ask about Gardasil coverage.

## 2014-01-12 NOTE — Progress Notes (Signed)
   Subjective:    Patient ID: Ariel Hale, female    DOB: 1991-01-21, 23 y.o.   MRN: [id number]  HPI  23 year old female presents for 1 month follow up depression and migraine.  She was started on fluoxetine 20 mg in August to treat both. She was referred to counselor at that time as well.  She reports no side effects to fluoxetine. She has noted some improvement in mood, she is not as upset with things anymore. Less anxiety.  She feels that there is room for improvement.  She is having issues with her fiance.  no SI, no HI.  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  She has not had any improvement with her migraines.  She has 2-3 severe migraines. She has daily headache otherwise, worse at times. She lies down, uses hot rice soak. Occ uses ibuprofen but does not help.   She has an appt with eye MD given lights triggering migraine.   Saw Dr. Mitchel HonourMegan Morris from GYN for  LGSIL, HPV . Will get records.  Pt interested in HPV.   Review of Systems  Constitutional: Negative for fever and fatigue.  HENT: Negative for ear pain.   Eyes: Negative for pain.  Respiratory: Negative for chest tightness and shortness of breath.   Cardiovascular: Negative for chest pain, palpitations and leg swelling.  Gastrointestinal: Negative for abdominal pain.  Genitourinary: Negative for dysuria.  Neurological: Positive for headaches.       Objective:   Physical Exam  Constitutional: Vital signs are normal. She appears well-developed and well-nourished. She is cooperative.  Non-toxic appearance. She does not appear ill. No distress.  HENT:  Head: Normocephalic.  Right Ear: Hearing, tympanic membrane, external ear and ear canal normal. Tympanic membrane is not erythematous, not retracted and not bulging.  Left Ear: Hearing, tympanic membrane, external ear and ear canal normal. Tympanic membrane is not erythematous, not retracted and not bulging.  Nose: No mucosal edema or  rhinorrhea. Right sinus exhibits no maxillary sinus tenderness and no frontal sinus tenderness. Left sinus exhibits no maxillary sinus tenderness and no frontal sinus tenderness.  Mouth/Throat: Uvula is midline, oropharynx is clear and moist and mucous membranes are normal.  Eyes: Conjunctivae, EOM and lids are normal. Pupils are equal, round, and reactive to light. Lids are everted and swept, no foreign bodies found.  Neck: Trachea normal and normal range of motion. Neck supple. Carotid bruit is not present. No mass and no thyromegaly present.  Cardiovascular: Normal rate, regular rhythm, S1 normal, S2 normal, normal heart sounds, intact distal pulses and normal pulses.  Exam reveals no gallop and no friction rub.   No murmur heard. Pulmonary/Chest: Effort normal and breath sounds normal. Not tachypneic. No respiratory distress. She has no decreased breath sounds. She has no wheezes. She has no rhonchi. She has no rales.  Abdominal: Soft. Normal appearance and bowel sounds are normal. There is no tenderness.  Neurological: She is alert.  Skin: Skin is warm, dry and intact. No rash noted.  Psychiatric: Her speech is normal. Judgment and thought content normal. Her mood appears not anxious. Her affect is blunt. She is withdrawn. Cognition and memory are normal. She exhibits a depressed mood.          Assessment & Plan:

## 2014-01-12 NOTE — Assessment & Plan Note (Signed)
Inadequate control. Increase SSRI.  Work on improving lifestyle. Maxalt MLT for acute migraine.

## 2014-02-12 ENCOUNTER — Ambulatory Visit: Payer: BC Managed Care – PPO | Admitting: Family Medicine

## 2014-02-16 ENCOUNTER — Ambulatory Visit: Payer: BC Managed Care – PPO

## 2014-02-26 ENCOUNTER — Ambulatory Visit (INDEPENDENT_AMBULATORY_CARE_PROVIDER_SITE_OTHER): Payer: BC Managed Care – PPO

## 2014-02-26 DIAGNOSIS — Z3042 Encounter for surveillance of injectable contraceptive: Secondary | ICD-10-CM

## 2014-02-26 MED ORDER — MEDROXYPROGESTERONE ACETATE 150 MG/ML IM SUSP
150.0000 mg | Freq: Once | INTRAMUSCULAR | Status: AC
  Administered 2014-02-26: 150 mg via INTRAMUSCULAR

## 2014-03-13 ENCOUNTER — Ambulatory Visit: Payer: Self-pay | Admitting: Emergency Medicine

## 2014-05-18 ENCOUNTER — Ambulatory Visit: Payer: BC Managed Care – PPO

## 2014-05-19 ENCOUNTER — Ambulatory Visit (INDEPENDENT_AMBULATORY_CARE_PROVIDER_SITE_OTHER): Payer: BLUE CROSS/BLUE SHIELD | Admitting: *Deleted

## 2014-05-19 DIAGNOSIS — Z309 Encounter for contraceptive management, unspecified: Secondary | ICD-10-CM

## 2014-05-19 MED ORDER — MEDROXYPROGESTERONE ACETATE 150 MG/ML IM SUSP
150.0000 mg | Freq: Once | INTRAMUSCULAR | Status: AC
  Administered 2014-05-19: 150 mg via INTRAMUSCULAR

## 2014-08-05 ENCOUNTER — Ambulatory Visit: Payer: BLUE CROSS/BLUE SHIELD

## 2014-08-11 ENCOUNTER — Ambulatory Visit: Payer: BLUE CROSS/BLUE SHIELD

## 2014-08-11 ENCOUNTER — Telehealth: Payer: Self-pay | Admitting: Family Medicine

## 2014-08-11 NOTE — Telephone Encounter (Signed)
Patient did not come in for their appointment today for depo shot.  Please let me know if patient needs to be contacted immediately for follow up or no follow up needed. °

## 2014-08-12 NOTE — Telephone Encounter (Signed)
Yes please contract her.

## 2014-08-13 ENCOUNTER — Ambulatory Visit (INDEPENDENT_AMBULATORY_CARE_PROVIDER_SITE_OTHER): Payer: BLUE CROSS/BLUE SHIELD | Admitting: *Deleted

## 2014-08-13 DIAGNOSIS — Z309 Encounter for contraceptive management, unspecified: Secondary | ICD-10-CM

## 2014-08-13 MED ORDER — MEDROXYPROGESTERONE ACETATE 150 MG/ML IM SUSP
150.0000 mg | Freq: Once | INTRAMUSCULAR | Status: AC
  Administered 2014-08-13: 150 mg via INTRAMUSCULAR

## 2014-08-27 ENCOUNTER — Ambulatory Visit (INDEPENDENT_AMBULATORY_CARE_PROVIDER_SITE_OTHER): Payer: BLUE CROSS/BLUE SHIELD | Admitting: Family Medicine

## 2014-08-27 ENCOUNTER — Encounter: Payer: Self-pay | Admitting: Family Medicine

## 2014-08-27 VITALS — BP 104/80 | HR 79 | Temp 98.6°F | Ht 67.0 in | Wt 154.2 lb

## 2014-08-27 DIAGNOSIS — F331 Major depressive disorder, recurrent, moderate: Secondary | ICD-10-CM

## 2014-08-27 DIAGNOSIS — F332 Major depressive disorder, recurrent severe without psychotic features: Secondary | ICD-10-CM

## 2014-08-27 DIAGNOSIS — G43019 Migraine without aura, intractable, without status migrainosus: Secondary | ICD-10-CM | POA: Diagnosis not present

## 2014-08-27 MED ORDER — FLUOXETINE HCL 20 MG PO TABS: 40.0000 mg | ORAL_TABLET | Freq: Every day | ORAL | Status: DC

## 2014-08-27 NOTE — Assessment & Plan Note (Signed)
Poor control.  Not clearly having true hallucinations.  No mania.  Recommend counseling and SSRI initiation. Will increase to 40 mg rapidly given 20 mg not effective in past.  Close follow up given  Past suicidal thoughts  Pt contracts for safety and was given  Help Line number.

## 2014-08-27 NOTE — Assessment & Plan Note (Signed)
ONly 2 per month, but very bothersome and quality of life changing for pt. Will have her start treatment for mood and we can focus on migraines if they do not improve with her moo.

## 2014-08-27 NOTE — Progress Notes (Signed)
Pre visit review using our clinic review tool, if applicable. No additional management support is needed unless otherwise documented below in the visit note. 

## 2014-08-27 NOTE — Progress Notes (Signed)
Subjective:    Patient ID: Ariel Hale, female    DOB: 10-06-1990, 24 y.o.   MRN: [id number]  HPI  24 year old female with history of MDD, recurrent  presents for anxiety.   She was seen last on 01/12/2014 when fluoxetine 20 mg was working somewhat.  Her mood makes her migraines worse well for her mood. Given she had room for improvement she was increased to 40 mg daily.  She never returned for her 1 month follow up.  Today she reports she never tried the higher dose. She stopped the lower dose as well. She states she has a hard time remembering to take medication. NO SE. She continues to have issues with depression and anxiety. She is very angry, irritable.. Turns into sadness.  Has anhedonia. Sleeping through the night. Wakes up fatigued.  She as been having off and on suicidal thought.   She continues to have migraines 2 times a month, last 3 days at a time. Hard to function , go to work with the migraine.   She does drink some alcohol, margarita, [place] ice tea, one drink at a time.. Twice a week.   She denies mania symptoms. State she does have voces in her head does not really hear them, but does not think these are hallucinations.[place]. Feels they are her thoughts. She had noted them when she was younger, went away and now returned in last year. Review of Systems  Constitutional: Positive for fatigue. Negative for fever.  HENT: Negative for ear pain.   Eyes: Negative for pain.  Respiratory: Negative for cough.   Cardiovascular: Negative for chest pain.       Objective:   Physical Exam  Constitutional: She is oriented to person, place, and time. Vital signs are normal. She appears well-developed and well-nourished. She is cooperative.  Non-toxic appearance. She does not appear ill. No distress.  HENT:  Head: Normocephalic.  Right Ear: Hearing, tympanic membrane, external ear and ear canal normal. Tympanic membrane is not erythematous, not retracted and not bulging.    Left Ear: Hearing, tympanic membrane, external ear and ear canal normal. Tympanic membrane is not erythematous, not retracted and not bulging.  Nose: No mucosal edema or rhinorrhea. Right sinus exhibits no maxillary sinus tenderness and no frontal sinus tenderness. Left sinus exhibits no maxillary sinus tenderness and no frontal sinus tenderness.  Mouth/Throat: Uvula is midline, oropharynx is clear and moist and mucous membranes are normal.  Eyes: Conjunctivae, EOM and lids are normal. Pupils are equal, round, and reactive to light. Lids are everted and swept, no foreign bodies found.  Neck: Trachea normal and normal range of motion. Neck supple. Carotid bruit is not present. No thyroid mass and no thyromegaly present.  Cardiovascular: Normal rate, regular rhythm, S1 normal, S2 normal, normal heart sounds, intact distal pulses and normal pulses.  Exam reveals no gallop and no friction rub.   No murmur heard. Pulmonary/Chest: Effort normal and breath sounds normal. No tachypnea. No respiratory distress. She has no decreased breath sounds. She has no wheezes. She has no rhonchi. She has no rales.  Abdominal: Soft. Normal appearance and bowel sounds are normal. There is no tenderness.  Neurological: She is alert and oriented to person, place, and time. She has normal strength and normal reflexes. No cranial nerve deficit or sensory deficit. She exhibits normal muscle tone. She displays a negative Romberg sign. Coordination and gait normal. GCS eye subscore is 4. GCS verbal subscore is 5. GCS  motor subscore is 6.  Nml cerebellar exam   No papilledema  Skin: Skin is warm, dry and intact. No rash noted.  Psychiatric: Her speech is normal. Her mood appears not anxious. Her affect is blunt. She is slowed and withdrawn. Cognition and memory are normal. Cognition and memory are not impaired. She expresses impulsivity. She exhibits a depressed mood. She expresses suicidal ideation. She expresses no homicidal  ideation. She expresses no suicidal plans and no homicidal plans. She exhibits normal recent memory and normal remote memory.          Assessment & Plan:

## 2014-08-27 NOTE — Patient Instructions (Addendum)
Stop at front desk to get referral to counselor. Start fluoxetine 20 mg daily x 1 week then increase to 40 mg daily. Work healthy eating and regular exercise.  Stop or decrease alcohol. Follow up in 2 weeks follow up mood. 30 min OV. HELP LINE: [phone]

## 2014-09-10 ENCOUNTER — Ambulatory Visit: Payer: BLUE CROSS/BLUE SHIELD | Admitting: Family Medicine

## 2014-09-29 ENCOUNTER — Ambulatory Visit: Payer: BLUE CROSS/BLUE SHIELD | Admitting: Psychology

## 2014-09-30 ENCOUNTER — Telehealth: Payer: Self-pay

## 2014-09-30 NOTE — Telephone Encounter (Signed)
Pt left v/m; pt had last Depo injection on 08/13/14; pt has been getting depo for 2 years; pt started menstrual period 2 days ago and this period is more like a normal menstrual period; when normally pt just experiences spotting since taking Depo.  Pts next depo injection is 11/03/14. Pt is experiencing cramping in lower abd. Pt is concerned since this has never happened since taking Depo and request cb.

## 2014-10-01 NOTE — Telephone Encounter (Signed)
This can happen. Not of particular concern. If she has been sexually active she can check apreg test. If not, no change needed.

## 2014-10-01 NOTE — Telephone Encounter (Signed)
Patient advised.

## 2014-10-21 ENCOUNTER — Ambulatory Visit (INDEPENDENT_AMBULATORY_CARE_PROVIDER_SITE_OTHER): Payer: BLUE CROSS/BLUE SHIELD | Admitting: Family Medicine

## 2014-10-21 ENCOUNTER — Encounter: Payer: Self-pay | Admitting: Family Medicine

## 2014-10-21 VITALS — BP 100/70 | HR 80 | Temp 97.9°F | Resp 17 | Ht 67.0 in | Wt 154.5 lb

## 2014-10-21 DIAGNOSIS — R109 Unspecified abdominal pain: Secondary | ICD-10-CM

## 2014-10-21 DIAGNOSIS — R110 Nausea: Secondary | ICD-10-CM

## 2014-10-21 DIAGNOSIS — N926 Irregular menstruation, unspecified: Secondary | ICD-10-CM | POA: Diagnosis not present

## 2014-10-21 LAB — POCT URINE PREGNANCY: PREG TEST UR: NEGATIVE

## 2014-10-21 NOTE — Progress Notes (Signed)
   Subjective:    Patient ID: Ariel Hale, female    DOB: [birth year], 24 y.o.   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 On DEPO for last few years, usually no periods, only occ spotting. 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  Referred to GYN, seen on 12/2013: recommend repeat pap in 1 year and HPV vaccine.  She reports that she had an unusual menses in middle of depo course in end of June/early July. Took preg test. Neg. Flow normal for menses and lasted 1 week. Followed by off and on spotting since. In last 3 days she started with lower abd cramping, significant.  No bleeding today. Some breast tenderness yesterday. Recent gluten free diet.   Sexually active, no new partners.  Wt Readings from Last 3 Encounters:  10/21/14 154 lb 8 oz (70.081 kg)  08/27/14 154 lb 4 oz (69.967 kg)  01/12/14 149 lb (67.586 kg)     Review of Systems  Constitutional: Negative for fever and fatigue.  Respiratory: Negative for cough and shortness of breath.   Gastrointestinal: Positive for nausea and constipation. Negative for diarrhea and blood in stool.       1-2 days no BM, but had nml BM last night.  Genitourinary: Negative for dysuria and urgency.       Objective:   Physical Exam  Constitutional: Vital signs are normal. She appears well-developed and well-nourished. She is cooperative.  Non-toxic appearance. She does not appear ill. No distress.  HENT:  Head: Normocephalic.  Right Ear: Hearing, tympanic membrane, external ear and ear canal normal. Tympanic membrane is not erythematous, not retracted and not bulging.  Left Ear: Hearing, tympanic membrane, external ear and ear canal normal. Tympanic membrane is not erythematous, not retracted and not bulging.  Nose: No mucosal edema or rhinorrhea. Right sinus exhibits no maxillary sinus tenderness and no frontal sinus tenderness. Left sinus exhibits  no maxillary sinus tenderness and no frontal sinus tenderness.  Mouth/Throat: Uvula is midline, oropharynx is clear and moist and mucous membranes are normal.  Eyes: Conjunctivae, EOM and lids are normal. Pupils are equal, round, and reactive to light. Lids are everted and swept, no foreign bodies found.  Neck: Trachea normal and normal range of motion. Neck supple. Carotid bruit is not present. No thyroid mass and no thyromegaly present.  Cardiovascular: Normal rate, regular rhythm, S1 normal, S2 normal, normal heart sounds, intact distal pulses and normal pulses.  Exam reveals no gallop and no friction rub.   No murmur heard. Pulmonary/Chest: Effort normal and breath sounds normal. No tachypnea. No respiratory distress. She has no decreased breath sounds. She has no wheezes. She has no rhonchi. She has no rales.  Abdominal: Soft. Normal appearance and bowel sounds are normal. There is no tenderness.  Neurological: She is alert.  Skin: Skin is warm, dry and intact. No rash noted.  Psychiatric: Her speech is normal and behavior is normal. Judgment and thought content normal. Her mood appears not anxious. Cognition and memory are normal. She does not exhibit a depressed mood.          Assessment & Plan:

## 2014-10-21 NOTE — Progress Notes (Signed)
Pre visit review using our clinic review tool, if applicable. No additional management support is needed unless otherwise documented below in the visit note. 

## 2014-10-21 NOTE — Assessment & Plan Note (Signed)
Possibly due to GERD. If persists consider further eval and likely PPI.

## 2014-10-21 NOTE — Assessment & Plan Note (Signed)
Ariel Hale be due to diet change versus hormonal fluctuations. Return to regular diet. Follow.

## 2014-10-21 NOTE — Patient Instructions (Signed)
Negative urine pregnancy.  Push fluids.  Rest.  Return to regular diet. Call if not improving in next 2 week, call sooner if abnormal bleeding returns.

## 2014-10-21 NOTE — Assessment & Plan Note (Signed)
Neg Upreg.  MAy be due to hormonal change.   Self limitied beleding, mild to moderate flow.  Follow over time.

## 2014-11-03 ENCOUNTER — Ambulatory Visit (INDEPENDENT_AMBULATORY_CARE_PROVIDER_SITE_OTHER): Payer: BLUE CROSS/BLUE SHIELD | Admitting: *Deleted

## 2014-11-03 DIAGNOSIS — Z309 Encounter for contraceptive management, unspecified: Secondary | ICD-10-CM | POA: Diagnosis not present

## 2014-11-03 MED ORDER — MEDROXYPROGESTERONE ACETATE 150 MG/ML IM SUSP
150.0000 mg | Freq: Once | INTRAMUSCULAR | Status: AC
  Administered 2014-11-03: 150 mg via INTRAMUSCULAR

## 2014-11-03 MED ORDER — METHYLPREDNISOLONE ACETATE 40 MG/ML IJ SUSP: 40.0000 mg | Freq: Once | INTRAMUSCULAR | Status: DC

## 2015-01-26 ENCOUNTER — Ambulatory Visit: Payer: BLUE CROSS/BLUE SHIELD

## 2015-01-27 ENCOUNTER — Ambulatory Visit (INDEPENDENT_AMBULATORY_CARE_PROVIDER_SITE_OTHER): Payer: BLUE CROSS/BLUE SHIELD | Admitting: *Deleted

## 2015-01-27 DIAGNOSIS — Z308 Encounter for other contraceptive management: Secondary | ICD-10-CM | POA: Diagnosis not present

## 2015-01-27 MED ORDER — MEDROXYPROGESTERONE ACETATE 150 MG/ML IM SUSP
150.0000 mg | Freq: Once | INTRAMUSCULAR | Status: AC
  Administered 2015-01-27: 150 mg via INTRAMUSCULAR

## 2015-03-10 ENCOUNTER — Telehealth: Payer: Self-pay

## 2015-03-10 NOTE — Telephone Encounter (Signed)
Attempted to contact patient regarding flu shot vaccination, but both numbers listed had been disconnected.

## 2015-04-14 ENCOUNTER — Ambulatory Visit (INDEPENDENT_AMBULATORY_CARE_PROVIDER_SITE_OTHER): Payer: BLUE CROSS/BLUE SHIELD | Admitting: *Deleted

## 2015-04-14 DIAGNOSIS — Z3049 Encounter for surveillance of other contraceptives: Secondary | ICD-10-CM | POA: Diagnosis not present

## 2015-04-14 DIAGNOSIS — Z3042 Encounter for surveillance of injectable contraceptive: Secondary | ICD-10-CM

## 2015-04-14 MED ORDER — MEDROXYPROGESTERONE ACETATE 150 MG/ML IM SUSP
150.0000 mg | Freq: Once | INTRAMUSCULAR | Status: AC
  Administered 2015-04-14: 150 mg via INTRAMUSCULAR

## 2015-04-21 ENCOUNTER — Encounter: Payer: Self-pay | Admitting: Family Medicine

## 2015-04-21 ENCOUNTER — Ambulatory Visit (INDEPENDENT_AMBULATORY_CARE_PROVIDER_SITE_OTHER): Payer: BLUE CROSS/BLUE SHIELD | Admitting: Family Medicine

## 2015-04-21 VITALS — BP 100/80 | HR 90 | Temp 98.4°F | Ht 67.0 in | Wt 158.8 lb

## 2015-04-21 DIAGNOSIS — G90A Postural orthostatic tachycardia syndrome (POTS): Secondary | ICD-10-CM | POA: Insufficient documentation

## 2015-04-21 DIAGNOSIS — R109 Unspecified abdominal pain: Secondary | ICD-10-CM

## 2015-04-21 DIAGNOSIS — R002 Palpitations: Secondary | ICD-10-CM | POA: Insufficient documentation

## 2015-04-21 DIAGNOSIS — R5383 Other fatigue: Secondary | ICD-10-CM

## 2015-04-21 DIAGNOSIS — R000 Tachycardia, unspecified: Secondary | ICD-10-CM

## 2015-04-21 DIAGNOSIS — R55 Syncope and collapse: Secondary | ICD-10-CM

## 2015-04-21 DIAGNOSIS — I498 Other specified cardiac arrhythmias: Secondary | ICD-10-CM | POA: Insufficient documentation

## 2015-04-21 DIAGNOSIS — I951 Orthostatic hypotension: Secondary | ICD-10-CM

## 2015-04-21 NOTE — Progress Notes (Signed)
Subjective:    Patient ID: Ariel LinemanCourtney M Hale, female    DOB: 08-10-1990, 25 y.o.   MRN: [id number]  HPI     25 year old female presents with Her mom for concern about celiac disease.   Her cousin has celiac disease.  She stopped gluten for 3 weeks.Marland Kitchen. Her headache and fatigue improved.  Bloating and gas improved. She previously had upper abdominal pain and cramping.. Improved off gluten. Once she restarted symptoms resolved.  No blood in stool.  She  Continues to have presyncopal issues off and on for years... She gets around syncope given she lies on ground. When she started back on gluten she had a full syncopal spell.   Saw cardiologist in past had tilt table test. BP dropped.  [facility]. Had her drink fluids. Take salt tablets. Given steroids and atenolol.  She has not been on this for sometime.    Social History /Family History/Past Medical History reviewed and updated if needed.    Review of Systems  Constitutional: Negative for fever and fatigue.  HENT: Negative for ear pain.   Eyes: Negative for pain.  Respiratory: Negative for cough, chest tightness and shortness of breath.   Cardiovascular: Negative for chest pain, palpitations and leg swelling.  Gastrointestinal: Positive for abdominal pain and constipation.  Genitourinary: Negative for dysuria.  Neurological: Positive for syncope and light-headedness. Negative for seizures and weakness.  Psychiatric/Behavioral: Positive for dysphoric mood.       Objective:   Physical Exam  Constitutional: She is oriented to person, place, and time. Vital signs are normal. She appears well-developed and well-nourished. She is cooperative.  Non-toxic appearance. She does not appear ill. No distress.  HENT:  Head: Normocephalic.  Right Ear: Hearing, tympanic membrane, external ear and ear canal normal. Tympanic membrane is not erythematous, not retracted and not bulging.  Left Ear: Hearing, tympanic membrane,  external ear and ear canal normal. Tympanic membrane is not erythematous, not retracted and not bulging.  Nose: No mucosal edema or rhinorrhea. Right sinus exhibits no maxillary sinus tenderness and no frontal sinus tenderness. Left sinus exhibits no maxillary sinus tenderness and no frontal sinus tenderness.  Mouth/Throat: Uvula is midline, oropharynx is clear and moist and mucous membranes are normal.  Eyes: Conjunctivae, EOM and lids are normal. Pupils are equal, round, and reactive to light. Lids are everted and swept, no foreign bodies found.  Neck: Trachea normal and normal range of motion. Neck supple. Carotid bruit is not present. No thyroid mass and no thyromegaly present.  Cardiovascular: Normal rate, regular rhythm, S1 normal, S2 normal, normal heart sounds, intact distal pulses and normal pulses.  Exam reveals no gallop and no friction rub.   No murmur heard. Pulmonary/Chest: Effort normal and breath sounds normal. No tachypnea. No respiratory distress. She has no decreased breath sounds. She has no wheezes. She has no rhonchi. She has no rales.  Abdominal: Soft. Normal appearance and bowel sounds are normal. There is no tenderness.  Neurological: She is alert and oriented to person, place, and time. She has normal strength and normal reflexes. No cranial nerve deficit or sensory deficit. She exhibits normal muscle tone. She displays a negative Romberg sign. Coordination and gait normal. GCS eye subscore is 4. GCS verbal subscore is 5. GCS motor subscore is 6.  Nml cerebellar exam   No papilledema  Skin: Skin is warm, dry and intact. No rash noted.  Psychiatric: She has a normal mood and affect. Her  speech is normal and behavior is normal. Judgment and thought content normal. Her mood appears not anxious. Cognition and memory are normal. Cognition and memory are not impaired. She does not exhibit a depressed mood. She exhibits normal recent memory and normal remote memory.            Assessment & Plan:

## 2015-04-21 NOTE — Patient Instructions (Addendum)
Restart gluten free diet.  Stop at lab on way put for celiac testing.  Stop at front desk for cardiology referral.

## 2015-04-21 NOTE — Progress Notes (Signed)
Pre visit review using our clinic review tool, if applicable. No additional management support is needed unless otherwise documented below in the visit note. 

## 2015-04-22 LAB — CELIAC PANEL 10
ENDOMYSIAL SCREEN: NEGATIVE
GLIADIN IGA: 16 U (ref <20)
Gliadin IgG: 4 Units (ref <20)
IgA: 259 mg/dL (ref 69–380)
TISSUE TRANSGLUT AB: 2 U/mL (ref <6)
TISSUE TRANSGLUTAMINASE AB, IGA: 2 U/mL (ref <4)

## 2015-04-22 NOTE — Assessment & Plan Note (Signed)
Has had workup in past by cardiologist Dr. Ace GinsBuck, age 25.  Positive tilt table test.  Now symptoms recurrent, have continue on mild scale.  Will refer back to cardiology for management of what sounds like POTs.

## 2015-04-22 NOTE — Assessment & Plan Note (Signed)
Referral to cardiology. For now increase fluids.

## 2015-04-22 NOTE — Assessment & Plan Note (Signed)
Resolved off gluten. Pt concerned she may have celiac disease. MAy be on other end of spectrum at gluten intolerance or sensitivity instead.  Will send for celiac panel.

## 2015-04-26 ENCOUNTER — Ambulatory Visit: Payer: BLUE CROSS/BLUE SHIELD | Admitting: Internal Medicine

## 2015-04-26 ENCOUNTER — Encounter: Payer: Self-pay | Admitting: Internal Medicine

## 2015-04-26 ENCOUNTER — Ambulatory Visit (INDEPENDENT_AMBULATORY_CARE_PROVIDER_SITE_OTHER): Payer: BLUE CROSS/BLUE SHIELD | Admitting: Internal Medicine

## 2015-04-26 ENCOUNTER — Telehealth: Payer: Self-pay | Admitting: *Deleted

## 2015-04-26 VITALS — BP 110/78 | HR 80 | Ht 67.0 in | Wt 161.2 lb

## 2015-04-26 DIAGNOSIS — G901 Familial dysautonomia [Riley-Day]: Secondary | ICD-10-CM

## 2015-04-26 DIAGNOSIS — F418 Other specified anxiety disorders: Secondary | ICD-10-CM | POA: Diagnosis not present

## 2015-04-26 DIAGNOSIS — F32A Depression, unspecified: Secondary | ICD-10-CM

## 2015-04-26 DIAGNOSIS — G909 Disorder of the autonomic nervous system, unspecified: Secondary | ICD-10-CM

## 2015-04-26 DIAGNOSIS — F419 Anxiety disorder, unspecified: Secondary | ICD-10-CM

## 2015-04-26 DIAGNOSIS — F329 Major depressive disorder, single episode, unspecified: Secondary | ICD-10-CM

## 2015-04-26 NOTE — Progress Notes (Signed)
ELECTROPHYSIOLOGY CONSULT NOTE  Patient ID: Ariel Hale, MRN: [id number], DOB/AGE: 25/11/1990 24 y.o. Admit date: (Not on file) Date of Consult: 04/26/2015  Primary Physician: Kerby Nora, MD Primary Cardiologist: new  Consulting Physician bledsoe  Chief Complaint: syncope   HPI Ariel Hale is a 25 y.o. female  With the prior diagnosis of May by Dr. Rosiland Oz at [facility] in 2008 and supported by an abnormal tilt table testing.  She was treated with Florinef and atenolol. She stopped after a year without intravenous symptoms.  She's had ongoing episodes of presyncope. She was married in October and there has been a significant increase in the frequency of her symptoms. She actually had an episode of loss of consciousness that occurred in the shower month or so ago. It was characterized by her typical prodrome of nausea dry mouth chest pain palpitations and seeing spots. Her husband tried to pick her up and she lost consciousness again. These episodes are recurrent associated with profound residual fatigue and some things go on for a couple of days.  Interestingly, she does not have significant heat intolerance shower intolerance. She has had some post exercise events and some church events. She acknowledges anxiety and depression has ongoing issues.  She's been treated with long-acting birth control; she has not noted aggravation of her symptoms  associated with her periods.  Her diet is deplete of salt and deplete of fluid      Past Medical History  Diagnosis Date  . Vasovagal syncope   . Syncope   . Chest pain   . Head ache       Surgical History: History reviewed. No pertinent past surgical history.   Home Meds: Prior to Admission medications   Medication Sig Start Date End Date Taking? Authorizing Provider  medroxyPROGESTERone (DEPO-PROVERA) 150 MG/ML injection Inject 150 mg into the muscle every 3 (three) months.   Yes Historical Provider, MD     Allergies:  Allergies  Allergen Reactions  . Hydrocodone-Acetaminophen     REACTION: nausea    Social History   Social History  . Marital Status: Single    Spouse Name: N/A  . Number of Children: N/A  . Years of Education: N/A   Occupational History  . Not on file.   Social History Main Topics  . Smoking status: Never Smoker   . Smokeless tobacco: Never Used  . Alcohol Use: 0.5 oz/week    1 Standard drinks or equivalent per week  . Drug Use: No  . Sexual Activity:    Partners: Male    Birth Control/ Protection: Injection     Comment: no condoms   Other Topics Concern  . Not on file   Social History Narrative     Family History  Problem Relation Age of Onset  . Osteoporosis Maternal Grandmother   . Diabetes Maternal Grandfather   . Heart disease Paternal Grandmother      ROS:  Please see the history of present illness.     All other systems reviewed and negative.    Physical Exam: Blood pressure 110/78, pulse 80, height  (1.702 m), weight 161 lb 3.2 oz (73.12 kg). General: Well developed, well nourished female in no acute distress. Head: Normocephalic, atraumatic, sclera non-icteric, no xanthomas, nares are without discharge. EENT: normal  Lymph Nodes:  none Neck: Negative for carotid bruits. JVD not elevated. Back:without scoliosis kyphosis Lungs: Clear bilaterally to auscultation without wheezes, rales, or rhonchi. Breathing is unlabored. Heart:  RRR with S1 S2. No  murmur . No rubs, or gallops appreciated. Abdomen: Soft, non-tender, non-distended with normoactive bowel sounds. No hepatomegaly. No rebound/guarding. No obvious abdominal masses. Msk:  Strength and tone appear normal for age. Extremities: No clubbing or cyanosis. No edema.  Distal pedal pulses are 2+ and equal bilaterally. Skin: Warm and Dry Neuro: Alert and oriented X 3. CN III-XII intact Grossly normal sensory and motor function . Psych:  Responds to questions appropriately with a  normal affect.      Labs: Cardiac Enzymes No results for input(s): CKTOTAL, CKMB, TROPONINI in the last 72 hours. CBC Lab Results  Component Value Date   WBC 5.5 11/27/2013   HGB 15.1* 11/27/2013   HCT 45.0 11/27/2013   MCV 92.3 11/27/2013   PLT 261.0 11/27/2013   PROTIME: No results for input(s): LABPROT, INR in the last 72 hours. Chemistry No results for input(s): NA, K, CL, CO2, BUN, CREATININE, CALCIUM, PROT, BILITOT, ALKPHOS, ALT, AST, GLUCOSE in the last 168 hours.  Invalid input(s): LABALBU Lipids No results found for: CHOL, HDL, LDLCALC, TRIG BNP No results found for: PROBNP Thyroid Function Tests: No results for input(s): TSH, T4TOTAL, T3FREE, THYROIDAB in the last 72 hours.  Invalid input(s): FREET3 Miscellaneous No results found for: DDIMER  Radiology/Studies:  No results found.  EKG:   ECG report from 2012 was normal.    Assessment and Plan:  Syncope-presumably neurally mediated  Anxiety/depression    The patient has any recurring symptom complex associated with a stereotypical prodrome suggestive of auditory and activation in the context of a neurally mediated reflexes. This is supported by her tilt table test which was modestly abnormal.  There are features however which I don't understand which include the lack of sensitivity, the lack of aggravation associated with her heavy menses, and residual weakness of her arms at last days afterwards. The latter raises the concerns about the anxiety and depression to which she admits that there may be a neuropsychiatric component contributing to and potentially aggravating her symptom complex.   I have suggested 1-increased fluid intake  2-increased salt intake and use ThermaTabs if unable to increase by mouth salt intake  3-consider addressing the anxiety/depression component   I've given her website information for NDR[facility] and [facility]. We will see her in 4-6 months

## 2015-04-26 NOTE — Telephone Encounter (Signed)
-----   Message from Excell Seltzer, MD sent at 04/26/2015  1:29 PM EST ----- Notify pt that celiac panel is negative.. If interested we can refer her to GI for further eval.

## 2015-04-26 NOTE — Telephone Encounter (Signed)
Left message for Srishti to return my call. 

## 2015-04-26 NOTE — Telephone Encounter (Signed)
Julissa notified as instructed by telephone.  She states she needs to check with her mother and call us back about the GI referral.

## 2015-06-30 ENCOUNTER — Ambulatory Visit: Payer: BLUE CROSS/BLUE SHIELD | Admitting: Family Medicine

## 2015-06-30 ENCOUNTER — Ambulatory Visit: Payer: BLUE CROSS/BLUE SHIELD

## 2015-07-01 ENCOUNTER — Ambulatory Visit (INDEPENDENT_AMBULATORY_CARE_PROVIDER_SITE_OTHER): Payer: BLUE CROSS/BLUE SHIELD | Admitting: *Deleted

## 2015-07-01 DIAGNOSIS — Z308 Encounter for other contraceptive management: Secondary | ICD-10-CM | POA: Diagnosis not present

## 2015-07-01 MED ORDER — MEDROXYPROGESTERONE ACETATE 150 MG/ML IM SUSP
150.0000 mg | Freq: Once | INTRAMUSCULAR | Status: AC
  Administered 2015-07-01: 150 mg via INTRAMUSCULAR

## 2015-09-22 ENCOUNTER — Ambulatory Visit: Payer: BLUE CROSS/BLUE SHIELD

## 2015-09-30 ENCOUNTER — Ambulatory Visit (INDEPENDENT_AMBULATORY_CARE_PROVIDER_SITE_OTHER): Payer: BLUE CROSS/BLUE SHIELD | Admitting: *Deleted

## 2015-09-30 DIAGNOSIS — Z308 Encounter for other contraceptive management: Secondary | ICD-10-CM

## 2015-09-30 MED ORDER — MEDROXYPROGESTERONE ACETATE 150 MG/ML IM SUSP
150.0000 mg | Freq: Once | INTRAMUSCULAR | Status: AC
  Administered 2015-09-30: 150 mg via INTRAMUSCULAR

## 2015-12-22 ENCOUNTER — Ambulatory Visit (INDEPENDENT_AMBULATORY_CARE_PROVIDER_SITE_OTHER): Payer: BLUE CROSS/BLUE SHIELD | Admitting: *Deleted

## 2015-12-22 DIAGNOSIS — Z308 Encounter for other contraceptive management: Secondary | ICD-10-CM | POA: Diagnosis not present

## 2015-12-22 MED ORDER — MEDROXYPROGESTERONE ACETATE 150 MG/ML IM SUSP
150.0000 mg | Freq: Once | INTRAMUSCULAR | Status: AC
Start: 2015-12-22 — End: 2015-12-22
  Administered 2015-12-22: 150 mg via INTRAMUSCULAR

## 2016-03-13 ENCOUNTER — Ambulatory Visit: Payer: BLUE CROSS/BLUE SHIELD

## 2016-03-13 ENCOUNTER — Telehealth: Payer: Self-pay | Admitting: Family Medicine

## 2016-03-13 NOTE — Telephone Encounter (Signed)
Needs follow-up

## 2016-03-13 NOTE — Telephone Encounter (Signed)
Patient did not come in for their appointment today for depo nurse visit  Please let me know if patient needs to be contacted immediately for follow up or no follow up needed.

## 2016-03-14 NOTE — Telephone Encounter (Signed)
Lm for pt to call back to reschedule 

## 2016-03-14 NOTE — Telephone Encounter (Signed)
Scheduled for 12/7 Pt aware

## 2016-03-15 ENCOUNTER — Ambulatory Visit (INDEPENDENT_AMBULATORY_CARE_PROVIDER_SITE_OTHER): Payer: BLUE CROSS/BLUE SHIELD

## 2016-03-15 DIAGNOSIS — Z308 Encounter for other contraceptive management: Secondary | ICD-10-CM | POA: Diagnosis not present

## 2016-03-15 MED ORDER — MEDROXYPROGESTERONE ACETATE 150 MG/ML IM SUSP
150.0000 mg | Freq: Once | INTRAMUSCULAR | Status: AC
  Administered 2016-03-15: 150 mg via INTRAMUSCULAR

## 2016-05-31 ENCOUNTER — Ambulatory Visit (INDEPENDENT_AMBULATORY_CARE_PROVIDER_SITE_OTHER): Payer: BLUE CROSS/BLUE SHIELD | Admitting: *Deleted

## 2016-05-31 DIAGNOSIS — Z3042 Encounter for surveillance of injectable contraceptive: Secondary | ICD-10-CM

## 2016-05-31 MED ORDER — MEDROXYPROGESTERONE ACETATE 150 MG/ML IM SUSP
150.0000 mg | INTRAMUSCULAR | Status: DC
  Administered 2016-05-31 – 2016-11-15 (×2): 150 mg via INTRAMUSCULAR

## 2016-08-21 ENCOUNTER — Telehealth: Payer: Self-pay

## 2016-08-21 NOTE — Telephone Encounter (Signed)
Agree. Can take  Up to 1 year. Stop now if considering pregnancy.

## 2016-08-21 NOTE — Telephone Encounter (Signed)
Pt and her husband are contemplating trying to get pregnant this fall; pt has been on depo shots approx 6 years. pt last depo injection was 05/31/16 and pt scheduled for next depo inj 08/23/16. Pt does not want to take next inj if going to take long time to get pregnant; I advised pt after stopping depo inj can take 4 - 12 months to have regular period and one large study indicated it averaged 10 months for someone to get pregnant after stopping Depo inj but again it varies per individual. Pt voiced understanding but request note to Dr Ermalene SearingBedsole to see what her opinion is of how long might it take pt to get pregnant after stopping Depo inj.

## 2016-08-21 NOTE — Telephone Encounter (Signed)
Dalexa notified as instructed by telephone.  Patient cancelled her appointment on Thursday for Depo Injection.

## 2016-08-23 ENCOUNTER — Ambulatory Visit: Payer: BLUE CROSS/BLUE SHIELD

## 2016-08-30 ENCOUNTER — Ambulatory Visit (INDEPENDENT_AMBULATORY_CARE_PROVIDER_SITE_OTHER): Payer: BLUE CROSS/BLUE SHIELD | Admitting: *Deleted

## 2016-08-30 DIAGNOSIS — Z309 Encounter for contraceptive management, unspecified: Secondary | ICD-10-CM

## 2016-08-30 MED ORDER — MEDROXYPROGESTERONE ACETATE 150 MG/ML IM SUSP
150.0000 mg | Freq: Once | INTRAMUSCULAR | Status: AC
  Administered 2016-08-30: 150 mg via INTRAMUSCULAR

## 2016-11-15 ENCOUNTER — Ambulatory Visit (INDEPENDENT_AMBULATORY_CARE_PROVIDER_SITE_OTHER): Payer: BLUE CROSS/BLUE SHIELD | Admitting: *Deleted

## 2016-11-15 DIAGNOSIS — Z3042 Encounter for surveillance of injectable contraceptive: Secondary | ICD-10-CM | POA: Diagnosis not present

## 2017-01-01 ENCOUNTER — Telehealth: Payer: Self-pay | Admitting: *Deleted

## 2017-01-01 NOTE — Telephone Encounter (Signed)
Pt left message at triage stating she lost her Depo schedule and was inquiring when she would be due for her next injection. Lm on pts vm and advised her last injection was 8/8 and her window to receive the next would be between 10/25-11/8

## 2017-04-11 ENCOUNTER — Ambulatory Visit (INDEPENDENT_AMBULATORY_CARE_PROVIDER_SITE_OTHER): Payer: BLUE CROSS/BLUE SHIELD | Admitting: Internal Medicine

## 2017-04-11 ENCOUNTER — Encounter: Payer: Self-pay | Admitting: Internal Medicine

## 2017-04-11 VITALS — BP 114/78 | HR 103 | Temp 98.3°F | Wt 166.8 lb

## 2017-04-11 DIAGNOSIS — K5900 Constipation, unspecified: Secondary | ICD-10-CM | POA: Diagnosis not present

## 2017-04-11 DIAGNOSIS — R5383 Other fatigue: Secondary | ICD-10-CM

## 2017-04-11 DIAGNOSIS — R110 Nausea: Secondary | ICD-10-CM | POA: Diagnosis not present

## 2017-04-11 DIAGNOSIS — N912 Amenorrhea, unspecified: Secondary | ICD-10-CM

## 2017-04-11 DIAGNOSIS — N644 Mastodynia: Secondary | ICD-10-CM | POA: Diagnosis not present

## 2017-04-11 DIAGNOSIS — R635 Abnormal weight gain: Secondary | ICD-10-CM

## 2017-04-11 LAB — COMPREHENSIVE METABOLIC PANEL
ALBUMIN: 4.2 g/dL (ref 3.5–5.2)
ALT: 18 U/L (ref 0–35)
AST: 18 U/L (ref 0–37)
Alkaline Phosphatase: 75 U/L (ref 39–117)
BUN: 12 mg/dL (ref 6–23)
CALCIUM: 9.7 mg/dL (ref 8.4–10.5)
CHLORIDE: 101 meq/L (ref 96–112)
CO2: 30 meq/L (ref 19–32)
Creatinine, Ser: 0.79 mg/dL (ref 0.40–1.20)
GFR: 93.28 mL/min (ref >60.00)
Glucose, Bld: 100 mg/dL — ABNORMAL HIGH (ref 70–99)
POTASSIUM: 4.4 meq/L (ref 3.5–5.1)
Sodium: 136 mEq/L (ref 135–145)
Total Bilirubin: 0.4 mg/dL (ref 0.2–1.2)
Total Protein: 7.1 g/dL (ref 6.0–8.3)

## 2017-04-11 LAB — CBC
HEMATOCRIT: 45.9 % (ref 36.0–46.0)
Hemoglobin: 15.4 g/dL — ABNORMAL HIGH (ref 12.0–15.0)
MCHC: 33.4 g/dL (ref 30.0–36.0)
MCV: 93.1 fl (ref 78.0–100.0)
PLATELETS: 297 10*3/uL (ref 150.0–400.0)
RBC: 4.93 Mil/uL (ref 3.87–5.11)
RDW: 12.9 % (ref 11.5–15.5)
WBC: 7.7 10*3/uL (ref 4.0–10.5)

## 2017-04-11 LAB — HCG, QUANTITATIVE, PREGNANCY: QUANTITATIVE HCG: 0.11 m[IU]/mL

## 2017-04-11 LAB — TSH: TSH: 2.24 u[IU]/mL (ref 0.35–4.50)

## 2017-04-11 NOTE — Patient Instructions (Signed)
Primary Amenorrhea Primary amenorrhea is the absence of any menstrual flow in a female by the age of 15 years. An average age for the start of menstruation is the age of 12 years. Primary amenorrhea is not considered to have occurred until a female is older than 15 years and has never menstruated. This may occur with or without other signs of puberty. What are the causes? Some common causes of not menstruating include:  Chromosomal abnormality causing the ovaries to malfunction is the most common cause of primary amenorrhea.  Malnutrition.  Low blood sugar (hypoglycemia).  Polycystic ovary syndrome (cysts in the ovaries, not ovulating).  Absence of the vagina, uterus, or ovaries since birth (congenital).  Extreme obesity.  Cystic fibrosis.  Drastic weight loss from any cause.  Over-exercising (running, biking) causing loss of body fat.  Pituitary gland tumor in the brain.  Long-term (chronic) illnesses.  Cushing disease.  Thyroid disease (hypothyroidism, hyperthyroidism).  Part of the brain (hypothalamus) not functioning normally.  Premature ovarian failure.  What are the signs or symptoms? No menstruation by age 15 years in normally developed females is the primary symptom. Other symptoms may include:  Discharge from the breasts.  Hot flashes.  Adult acne.  Facial or chest hair.  Headaches.  Impaired vision.  Recent stress.  Changes in weight, diet, or exercise patterns.  How is this diagnosed? Primary amenorrhea is diagnosed with the help of a medical history and a physical exam. Other tests that may be recommended include:  Blood tests to check for pregnancy, hormonal changes, a bleeding or thyroid disorder, low iron levels (anemia), or other problems.  Urine tests.  Specialized X-ray exams.  How is this treated? Treatment will depend on the cause. For example, some of the causes of primary amenorrhea, such as congenital absence of sex organs, will  require surgery to correct. Others may respond to treatment with medicine. Contact a health care provider if:  There has not been any menstrual flow by age 15 years.  Body maturation does not occur at a level typical of peers.  Pelvic area pain occurs.  There is unusual weight gain or hair growth. This information is not intended to replace advice given to you by your health care provider. Make sure you discuss any questions you have with your health care provider. Document Released: 03/26/2005 Document Revised: 09/01/2015 Document Reviewed: 11/05/2012 Elsevier Interactive Patient Education  2018 [employer]  

## 2017-04-11 NOTE — Progress Notes (Signed)
Subjective:    Patient ID: Ariel Hale, female    DOB: June 10, 1990, 27 y.o.   MRN: [id number]  HPI  Pt presents to the clinic today requesting a pregnancy test. She reports her last Depo Provera injection was 11/15/2016. She has not had a menstrual cycle since that time. She is sexually active and has been actively trying to concieve. She reports over the last month, she has experienced weight gain, nausea, fatigue, constipation and breast tenderness. She has not had a menstrual cycle sine stopping Depo. She took a pregnancy test at home on Sunday which was negative. She does have a family history of thyroid disorders.  Review of Systems      Past Medical History:  Diagnosis Date  . Chest pain   . Head ache   . Syncope   . Vasovagal syncope     Current Outpatient Medications  Medication Sig Dispense Refill  . medroxyPROGESTERone (DEPO-PROVERA) 150 MG/ML injection Inject 150 mg into the muscle every 3 (three) months.     Current Facility-Administered Medications  Medication Dose Route Frequency Provider Last Rate Last Dose  . medroxyPROGESTERone (DEPO-PROVERA) injection 150 mg  150 mg Intramuscular Q90 days Bedsole, Amy E, MD   150 mg at 11/15/16 1611    Allergies  Allergen Reactions  . Hydrocodone-Acetaminophen     REACTION: nausea    Family History  Problem Relation Age of Onset  . Osteoporosis Maternal Grandmother   . Diabetes Maternal Grandfather   . Heart disease Paternal Grandmother     Social History   Socioeconomic History  . Marital status: Married    Spouse name: Not on file  . Number of children: Not on file  . Years of education: Not on file  . Highest education level: Not on file  Social Needs  . Financial resource strain: Not on file  . Food insecurity - worry: Not on file  . Food insecurity - inability: Not on file  . Transportation needs - medical: Not on file  . Transportation needs - non-medical: Not on file  Occupational History  .  Not on file  Tobacco Use  . Smoking status: Never Smoker  . Smokeless tobacco: Never Used  Substance and Sexual Activity  . Alcohol use: Yes    Alcohol/week: 0.5 oz    Types: 1 Standard drinks or equivalent per week  . Drug use: No  . Sexual activity: Not Currently    Partners: Male    Birth control/protection: Injection    Comment: no condoms  Other Topics Concern  . Not on file  Social History Narrative  . Not on file     Constitutional: Pt reports fatigue and weight gain. Denies fever, malaise, headache.  Respiratory: Denies difficulty breathing, shortness of breath, cough or sputum production.   Cardiovascular: Denies chest pain, chest tightness, palpitations or swelling in the hands or feet.  Gastrointestinal: Pt reports nausea. Denies abdominal pain, bloating, constipation, diarrhea or blood in the stool.  GU: Pt reports amenorrhea. Denies urgency, frequency, pain with urination, burning sensation, blood in urine, odor or discharge. Skin: Pt reports breast tenderness. Denies redness, rashes, lesions or ulcercations.   No other specific complaints in a complete review of systems (except as listed in HPI above).  Objective:   Physical Exam BP 114/78   Pulse (!) 103   Temp 98.3 F (36.8 C)   Wt 166 lb 12.8 oz (75.7 kg)   SpO2 94%   BMI 26.12 kg/m  Wt Readings  from Last 3 Encounters:  04/11/17 166 lb 12.8 oz (75.7 kg)  04/26/15 161 lb 3.2 oz (73.1 kg)  04/21/15 158 lb 12 oz (72 kg)    General: Appears her stated age, well developed, well nourished in NAD. Neck:  Neck supple, trachea midline. No masses, lumps or thyromegaly present.  Cardiovascular: Tachycardic with normal rhythm. S1,S2 noted.  Pulmonary/Chest: Normal effort and positive vesicular breath sounds. No respiratory distress. No wheezes, rales or ronchi noted.  Abdomen: Soft and nontender. Normal bowel sounds. No distention or masses noted.  Neurological: Alert and oriented.    BMET    Component  Value Date/Time   NA 140 05/30/2010 1212   K 4.5 05/30/2010 1212   CL 105 05/30/2010 1212   CO2 27 05/30/2010 1212   GLUCOSE 85 05/30/2010 1212   BUN 11 05/30/2010 1212   CREATININE 0.8 05/30/2010 1212   CALCIUM 9.4 05/10/2011 1526   GFRNONAA NOT CALCULATED 10/23/2006 1936   GFRAA  10/23/2006 1936    NOT CALCULATED        The eGFR has been calculated using the MDRD equation. This calculation has not been validated in all clinical    Lipid Panel  No results found for: CHOL, TRIG, HDL, CHOLHDL, VLDL, LDLCALC  CBC    Component Value Date/Time   WBC 5.5 11/27/2013 1505   RBC 4.87 11/27/2013 1505   HGB 15.1 (H) 11/27/2013 1505   HCT 45.0 11/27/2013 1505   PLT 261.0 11/27/2013 1505   MCV 92.3 11/27/2013 1505   MCHC 33.7 11/27/2013 1505   RDW 12.6 11/27/2013 1505   LYMPHSABS 2.4 11/27/2013 1505   MONOABS 0.6 11/27/2013 1505   EOSABS 0.2 11/27/2013 1505   BASOSABS 0.1 11/27/2013 1505    Hgb A1C No results found for: HGBA1C           Assessment & Plan:   Amenorrhea, Nausea, Constipation, Breast Tenderness, Fatigue and Weight Gain:  Serum HcG Quant Will check CBC, CMET and TSH today Advised her to take no OTC products until we rule out pregnancy  Will follow up after test results, return precautions discussed Webb Silversmith, NP

## 2017-04-12 ENCOUNTER — Telehealth: Payer: Self-pay | Admitting: Family Medicine

## 2017-04-12 NOTE — Telephone Encounter (Signed)
Copied from CRM [phone]. Topic: Quick Communication - See Telephone Encounter >> Apr 12, 2017  1:17 PM Diana EvesHoyt, Maryann B wrote: CRM for notification. See Telephone encounter for:  Pt calling about lab results from yesterday 04/11/17 04/12/17.

## 2017-04-12 NOTE — Telephone Encounter (Signed)
Pt notified as instructed in lab result notes. Advised pt can also view in mychart. Pt voiced understanding and nothing further needed at this time.

## 2017-10-02 ENCOUNTER — Encounter: Payer: Self-pay | Admitting: Family Medicine

## 2017-11-15 ENCOUNTER — Ambulatory Visit (INDEPENDENT_AMBULATORY_CARE_PROVIDER_SITE_OTHER): Payer: BLUE CROSS/BLUE SHIELD | Admitting: Family Medicine

## 2017-11-15 ENCOUNTER — Encounter: Payer: Self-pay | Admitting: Family Medicine

## 2017-11-15 VITALS — BP 90/70 | HR 87 | Temp 98.3°F | Ht 67.0 in | Wt 156.5 lb

## 2017-11-15 DIAGNOSIS — Z789 Other specified health status: Secondary | ICD-10-CM

## 2017-11-15 DIAGNOSIS — R21 Rash and other nonspecific skin eruption: Secondary | ICD-10-CM

## 2017-11-15 MED ORDER — TRIAMCINOLONE ACETONIDE 0.5 % EX CREA: 1.0000 "application " | TOPICAL_CREAM | Freq: Two times a day (BID) | CUTANEOUS | 0 refills | Status: DC

## 2017-11-15 NOTE — Patient Instructions (Addendum)
Start regular exercise, eat healthy, sleep 8 hours a night, stress reduction.  Start prenatal vitamin ( 0.4-0.8 mg of folic acid).   Start topical steroid cream twice daily x 2 weeks.  Start daily Cetaphil cream.   Call if rash not improving as expected.

## 2017-11-15 NOTE — Progress Notes (Signed)
   Subjective:    Patient ID: Ariel RacerCourtney Mae Rody, female    DOB: 18-Feb-1991, 27 y.o.   MRN: [id number]  HPI   27 year old female presents for eval of rash on back.. Started on left upper back ongoing x 1 month.  Initially started as large scaly lesion. More smaller lesions started,on back, spread to face and right leg.  She has no new exposure. Very itchy on back. No flu like symptoms.  No new meds.   Has tried hydrocortisone cream.. Helped some with itching.  Tried zinc oxide past.. Did not help.   She has been trying to get pregnant in last year.  She has been off of Depo  Since 11/2016.  HX of  Menstrual issues that she was place on depo for.  Menses have now become normal.. Once a month, uses 1-2 days heavy, otherwise light. Has associated breast soreness.   Had nml labs CBC, CMET, TSH in 04/2017  No history of skin issue.    Review of Systems  Constitutional: Negative for fatigue and fever.  HENT: Negative for congestion.   Eyes: Negative for pain.  Respiratory: Negative for cough and shortness of breath.   Cardiovascular: Negative for chest pain, palpitations and leg swelling.  Gastrointestinal: Negative for abdominal pain.  Genitourinary: Negative for dysuria and vaginal bleeding.  Musculoskeletal: Negative for back pain.  Neurological: Negative for syncope, light-headedness and headaches.  Psychiatric/Behavioral: Negative for dysphoric mood.       Objective:   Physical Exam  Constitutional: Vital signs are normal. She appears well-developed and well-nourished. She is cooperative.  Non-toxic appearance. She does not appear ill. No distress.  HENT:  Head: Normocephalic.  Right Ear: Hearing, tympanic membrane, external ear and ear canal normal.  Left Ear: Hearing, tympanic membrane, external ear and ear canal normal.  Nose: Nose normal.  Eyes: Pupils are equal, round, and reactive to light. Conjunctivae, EOM and lids are normal. Lids are everted and swept, no  foreign bodies found.  Neck: Trachea normal and normal range of motion. Neck supple. Carotid bruit is not present. No thyroid mass and no thyromegaly present.  Cardiovascular: Normal rate, regular rhythm, S1 normal, S2 normal, normal heart sounds and intact distal pulses. Exam reveals no gallop.  No murmur heard. Pulmonary/Chest: Effort normal and breath sounds normal. No respiratory distress. She has no wheezes. She has no rhonchi. She has no rales.  Abdominal: Soft. Normal appearance and bowel sounds are normal. She exhibits no distension, no fluid wave, no abdominal bruit and no mass. There is no hepatosplenomegaly. There is no tenderness. There is no rebound, no guarding and no CVA tenderness. No hernia.  Lymphadenopathy:    She has no cervical adenopathy.    She has no axillary adenopathy.  Neurological: She is alert. She has normal strength. No cranial nerve deficit or sensory deficit.  Skin: Skin is warm, dry and intact. No purpura and no rash noted. Rash is not pustular, not vesicular and not urticarial.  Maculopapular erythematous rash on back... Flaky and itchy per pt  Psychiatric: Her speech is normal and behavior is normal. Judgment normal. Her mood appears not anxious. Cognition and memory are normal. She does not exhibit a depressed mood.          Assessment & Plan:

## 2017-12-10 ENCOUNTER — Ambulatory Visit (INDEPENDENT_AMBULATORY_CARE_PROVIDER_SITE_OTHER): Payer: BLUE CROSS/BLUE SHIELD | Admitting: Family Medicine

## 2017-12-10 ENCOUNTER — Ambulatory Visit: Payer: Self-pay | Admitting: *Deleted

## 2017-12-10 ENCOUNTER — Encounter: Payer: Self-pay | Admitting: Family Medicine

## 2017-12-10 VITALS — BP 102/62 | HR 90 | Temp 98.5°F | Ht 67.5 in | Wt 157.5 lb

## 2017-12-10 DIAGNOSIS — N912 Amenorrhea, unspecified: Secondary | ICD-10-CM | POA: Insufficient documentation

## 2017-12-10 LAB — POCT URINE PREGNANCY: PREG TEST UR: POSITIVE — AB

## 2017-12-10 NOTE — Addendum Note (Signed)
Addended by: [name] on: 12/10/2017 11:48 AM   Modules accepted: Orders

## 2017-12-10 NOTE — Assessment & Plan Note (Addendum)
Positive preg test. Will send for HCG quant.  Info given on prenatal care and encouraged prenatal vitamin.

## 2017-12-10 NOTE — Progress Notes (Signed)
   Subjective:    Patient ID: Ariel Hale, female    DOB: Dec 01, 1990, 27 y.o.   MRN: [id number]  HPI  27 year old female presents with positive home pregnancy test. She requests a blood pregnancy test. She has been trying to get pregnant. Has not yet started prenatal vitamin.   LMP July 24 lighter 3 day period.  Missed last menses   She has noted she is mildly nauseous, fatigues and craving ice.    Review of Systems  Constitutional: Negative for fatigue and fever.  HENT: Negative for ear pain.   Eyes: Negative for pain.  Respiratory: Negative for chest tightness and shortness of breath.   Cardiovascular: Negative for chest pain, palpitations and leg swelling.  Gastrointestinal: Negative for abdominal pain.  Genitourinary: Negative for dysuria.       Objective:   Physical Exam  Constitutional: Vital signs are normal. She appears well-developed and well-nourished. She is cooperative.  Non-toxic appearance. She does not appear ill. No distress.  HENT:  Head: Normocephalic.  Right Ear: Hearing, tympanic membrane, external ear and ear canal normal. Tympanic membrane is not erythematous, not retracted and not bulging.  Left Ear: Hearing, tympanic membrane, external ear and ear canal normal. Tympanic membrane is not erythematous, not retracted and not bulging.  Nose: No mucosal edema or rhinorrhea. Right sinus exhibits no maxillary sinus tenderness and no frontal sinus tenderness. Left sinus exhibits no maxillary sinus tenderness and no frontal sinus tenderness.  Mouth/Throat: Uvula is midline, oropharynx is clear and moist and mucous membranes are normal.  Eyes: Pupils are equal, round, and reactive to light. Conjunctivae, EOM and lids are normal. Lids are everted and swept, no foreign bodies found.  Neck: Trachea normal and normal range of motion. Neck supple. Carotid bruit is not present. No thyroid mass and no thyromegaly present.  Cardiovascular: Normal rate, regular  rhythm, S1 normal, S2 normal, normal heart sounds, intact distal pulses and normal pulses. Exam reveals no gallop and no friction rub.  No murmur heard. Pulmonary/Chest: Effort normal and breath sounds normal. No tachypnea. No respiratory distress. She has no decreased breath sounds. She has no wheezes. She has no rhonchi. She has no rales.  Abdominal: Soft. Normal appearance and bowel sounds are normal. There is no tenderness.  Neurological: She is alert.  Skin: Skin is warm, dry and intact. No rash noted.  Psychiatric: Her speech is normal and behavior is normal. Judgment and thought content normal. Her mood appears not anxious. Cognition and memory are normal. She does not exhibit a depressed mood.          Assessment & Plan:

## 2017-12-10 NOTE — Telephone Encounter (Signed)
Pt had confirmed pregnancy today during OV with Dr. Ermalene Searing and wants to know what over the counter medications she could take to help with head congestion. Pt states she has been experiencing a runny nose, coughing and stuffiness since Saturday. Pt states that she has been around someone that has been sick recently and their symptoms only lasted a day. Pt advised that information would be forwarded to Dr. Ermalene Searing for recommendations and pt also advised that the pharmacy could also be contacted to get recommendations. Pt verbalized understanding.  Reason for Disposition . Cold with no complications  Answer Assessment - Initial Assessment Questions 1. ONSET: "When did the nasal discharge start?"      Saturday-Sunday and pt was around some on the that was sick 2. AMOUNT: "How much discharge is there?"      Not assessed 3. COUGH: "Do you have a cough?" If yes, ask: "Describe the color of your sputum" (clear, white, yellow, green)     Yes has a cough 4. RESPIRATORY DISTRESS: "Describe your breathing."      No issues with breathing voiced at this time 5. FEVER: "Do you have a fever?" If so, ask: "What is your temperature, how was it measured, and when did it start?" No, temperature checked during office visit today 6. SEVERITY: "Overall, how bad are you feeling right now?" (e.g., doesn't interfere with normal activities, staying home from school/work, staying in bed)      Not assessed 7. OTHER SYMPTOMS: "Do you have any other symptoms?" (e.g., sore throat, earache, wheezing, vomiting)     Runny nose, cough and stuffiness 8. PREGNANCY: "Is there any chance you are pregnant?" "When was your last menstrual period?"     Yes pregnancy confirmed today  Protocols used: COMMON COLD-A-AH

## 2017-12-10 NOTE — Patient Instructions (Signed)
Please stop at the lab to have labs drawn.    Prenatal Care WHAT IS PRENATAL CARE? Prenatal care is the process of caring for a pregnant woman before she gives birth. Prenatal care makes sure that she and her baby remain as healthy as possible throughout pregnancy. Prenatal care may be provided by a midwife, family practice health care provider, or a childbirth and pregnancy specialist (obstetrician). Prenatal care may include physical examinations, testing, treatments, and education on nutrition, lifestyle, and social support services. WHY IS PRENATAL CARE SO IMPORTANT? Early and consistent prenatal care increases the chance that you and your baby will remain healthy throughout your pregnancy. This type of care also decreases a baby's risk of being born too early (prematurely), or being born smaller than expected (small for gestational age). Any underlying medical conditions you may have that could pose a risk during your pregnancy are discussed during prenatal care visits. You will also be monitored regularly for any new conditions that may arise during your pregnancy so they can be treated quickly and effectively. WHAT HAPPENS DURING PRENATAL CARE VISITS? Prenatal care visits may include the following: Discussion Tell your health care provider about any new signs or symptoms you have experienced since your last visit. These might include:  Nausea or vomiting.  Increased or decreased level of energy.  Difficulty sleeping.  Back or leg pain.  Weight changes.  Frequent urination.  Shortness of breath with physical activity.  Changes in your skin, such as the development of a rash or itchiness.  Vaginal discharge or bleeding.  Feelings of excitement or nervousness.  Changes in your baby's movements.  You may want to write down any questions or topics you want to discuss with your health care provider and bring them with you to your appointment. Examination During your first  prenatal care visit, you will likely have a complete physical exam. Your health care provider will often examine your vagina, cervix, and the position of your uterus, as well as check your heart, lungs, and other body systems. As your pregnancy progresses, your health care provider will measure the size of your uterus and your baby's position inside your uterus. He or she may also examine you for early signs of labor. Your prenatal visits may also include checking your blood pressure and, after about 10-12 weeks of pregnancy, listening to your baby's heartbeat. Testing Regular testing often includes:  Urinalysis. This checks your urine for glucose, protein, or signs of infection.  Blood count. This checks the levels of white and red blood cells in your body.  Tests for sexually transmitted infections (STIs). Testing for STIs at the beginning of pregnancy is routinely done and is required in many states.  Antibody testing. You will be checked to see if you are immune to certain illnesses, such as rubella, that can affect a developing fetus.  Glucose screen. Around 24-28 weeks of pregnancy, your blood glucose level will be checked for signs of gestational diabetes. Follow-up tests may be recommended.  Group B strep. This is a bacteria that is commonly found inside a woman's vagina. This test will inform your health care provider if you need an antibiotic to reduce the amount of this bacteria in your body prior to labor and childbirth.  Ultrasound. Many pregnant women undergo an ultrasound screening around 18-20 weeks of pregnancy to evaluate the health of the fetus and check for any developmental abnormalities.  HIV (human immunodeficiency virus) testing. Early in your pregnancy, you will be screened for  HIV. If you are at high risk for HIV, this test may be repeated during your third trimester of pregnancy.  You may be offered other testing based on your age, personal or family medical history, or  other factors. HOW OFTEN SHOULD I PLAN TO SEE MY HEALTH CARE PROVIDER FOR PRENATAL CARE? Your prenatal care check-up schedule depends on any medical conditions you have before, or develop during, your pregnancy. If you do not have any underlying medical conditions, you will likely be seen for checkups:  Monthly, during the first 6 months of pregnancy.  Twice a month during months 7 and 8 of pregnancy.  Weekly starting in the 9th month of pregnancy and until delivery.  If you develop signs of early labor or other concerning signs or symptoms, you may need to see your health care provider more often. Ask your health care provider what prenatal care schedule is best for you. WHAT CAN I DO TO KEEP MYSELF AND MY BABY AS HEALTHY AS POSSIBLE DURING MY PREGNANCY?  Take a prenatal vitamin containing 400 micrograms (0.4 mg) of folic acid every day. Your health care provider may also ask you to take additional vitamins such as iodine, vitamin D, iron, copper, and zinc.  Take 1500-2000 mg of calcium daily starting at your 20th week of pregnancy until you deliver your baby.  Make sure you are up to date on your vaccinations. Unless directed otherwise by your health care provider: ? You should receive a tetanus, diphtheria, and pertussis (Tdap) vaccination between the 27th and 36th week of your pregnancy, regardless of when your last Tdap immunization occurred. This helps protect your baby from whooping cough (pertussis) after he or she is born. ? You should receive an annual inactivated influenza vaccine (IIV) to help protect you and your baby from influenza. This can be done at any point during your pregnancy.  Eat a well-rounded diet that includes: ? Fresh fruits and vegetables. ? Lean proteins. ? Calcium-rich foods such as milk, yogurt, hard cheeses, and dark, leafy greens. ? Whole grain breads.  Do noteat seafood high in mercury, including: ? Swordfish. ? Tilefish. ? Shark. ? King  mackerel. ? More than 6 oz tuna per week.  Do not eat: ? Raw or undercooked meats or eggs. ? Unpasteurized foods, such as soft cheeses (brie, blue, or feta), juices, and milks. ? Lunch meats. ? Hot dogs that have not been heated until they are steaming.  Drink enough water to keep your urine clear or pale yellow. For many women, this may be 10 or more 8 oz glasses of water each day. Keeping yourself hydrated helps deliver nutrients to your baby and may prevent the start of pre-term uterine contractions.  Do not use any tobacco products including cigarettes, chewing tobacco, or electronic cigarettes. If you need help quitting, ask your health care provider.  Do not drink beverages containing alcohol. No safe level of alcohol consumption during pregnancy has been determined.  Do not use any illegal drugs. These can harm your developing baby or cause a miscarriage.  Ask your health care provider or pharmacist before taking any prescription or over-the-counter medicines, herbs, or supplements.  Limit your caffeine intake to no more than 200 mg per day.  Exercise. Unless told otherwise by your health care provider, try to get 30 minutes of moderate exercise most days of the week. Do not  do high-impact activities, contact sports, or activities with a high risk of falling, such as horseback riding or downhill  skiing.  Get plenty of rest.  Avoid anything that raises your body temperature, such as hot tubs and saunas.  If you own a cat, do not empty its litter box. Bacteria contained in cat feces can cause an infection called toxoplasmosis. This can result in serious harm to the fetus.  Stay away from chemicals such as insecticides, lead, mercury, and cleaning or paint products that contain solvents.  Do not have any X-rays taken unless medically necessary.  Take a childbirth and breastfeeding preparation class. Ask your health care provider if you need a referral or recommendation.  This  information is not intended to replace advice given to you by your health care provider. Make sure you discuss any questions you have with your health care provider. Document Released: 03/29/2003 Document Revised: 08/29/2015 Document Reviewed: 06/10/2013 Elsevier Interactive Patient Education  2017 [employer].

## 2017-12-11 LAB — HCG, QUANTITATIVE, PREGNANCY: HCG, Total, QN: 12001 m[IU]/mL

## 2017-12-12 NOTE — Telephone Encounter (Signed)
Medication list safe to use during pregnancy mailed to patient.  I spoke with her to let up know I have mailed her the list of medication and advised she can take regular strength tylenol, plain Robitussin and Normal Saline Nasal Spray for her cold symptoms.  Patient states understanding

## 2017-12-12 NOTE — Telephone Encounter (Signed)
Please send pt info packet on meds okay to use PT in pregnancy if not already done on Tuesday. Let pt know.

## 2017-12-17 DIAGNOSIS — N911 Secondary amenorrhea: Secondary | ICD-10-CM | POA: Diagnosis not present

## 2017-12-17 DIAGNOSIS — Z3201 Encounter for pregnancy test, result positive: Secondary | ICD-10-CM | POA: Diagnosis not present

## 2017-12-18 DIAGNOSIS — Z789 Other specified health status: Secondary | ICD-10-CM | POA: Insufficient documentation

## 2017-12-18 DIAGNOSIS — R21 Rash and other nonspecific skin eruption: Secondary | ICD-10-CM | POA: Insufficient documentation

## 2017-12-18 NOTE — Assessment & Plan Note (Signed)
Likely eczema or allergic dermatitis variant. Treat with topical steroid cream and moisturizing cream.

## 2017-12-18 NOTE — Assessment & Plan Note (Signed)
Reviewed healthy lifestyle , prenatal care and ways to increase ability to conceive. No concerns at this point, pt has not been off depo quite 1 year, encouraged her to give it more time.

## 2018-01-08 DIAGNOSIS — Z3A10 10 weeks gestation of pregnancy: Secondary | ICD-10-CM | POA: Diagnosis not present

## 2018-01-08 DIAGNOSIS — Z3689 Encounter for other specified antenatal screening: Secondary | ICD-10-CM | POA: Diagnosis not present

## 2018-01-08 DIAGNOSIS — O26891 Other specified pregnancy related conditions, first trimester: Secondary | ICD-10-CM | POA: Diagnosis not present

## 2018-01-08 DIAGNOSIS — Z113 Encounter for screening for infections with a predominantly sexual mode of transmission: Secondary | ICD-10-CM | POA: Diagnosis not present

## 2018-01-09 DIAGNOSIS — Z124 Encounter for screening for malignant neoplasm of cervix: Secondary | ICD-10-CM | POA: Diagnosis not present

## 2018-01-09 LAB — HM PAP SMEAR: HM Pap smear: NEGATIVE

## 2018-03-10 DIAGNOSIS — Z363 Encounter for antenatal screening for malformations: Secondary | ICD-10-CM | POA: Diagnosis not present

## 2018-03-10 DIAGNOSIS — Z3A18 18 weeks gestation of pregnancy: Secondary | ICD-10-CM | POA: Diagnosis not present

## 2018-03-13 ENCOUNTER — Inpatient Hospital Stay (HOSPITAL_COMMUNITY)
Admission: AD | Admit: 2018-03-13 | Discharge: 2018-03-13 | Disposition: A | Discharge status: Home / Self Care | Payer: BLUE CROSS/BLUE SHIELD | Source: Ambulatory Visit | Attending: Obstetrics and Gynecology | Admitting: Obstetrics and Gynecology

## 2018-03-13 ENCOUNTER — Encounter (HOSPITAL_COMMUNITY): Payer: Self-pay | Admitting: *Deleted

## 2018-03-13 ENCOUNTER — Other Ambulatory Visit: Payer: Self-pay

## 2018-03-13 DIAGNOSIS — O26892 Other specified pregnancy related conditions, second trimester: Secondary | ICD-10-CM | POA: Diagnosis not present

## 2018-03-13 DIAGNOSIS — Z3A18 18 weeks gestation of pregnancy: Secondary | ICD-10-CM | POA: Insufficient documentation

## 2018-03-13 DIAGNOSIS — R0789 Other chest pain: Secondary | ICD-10-CM | POA: Insufficient documentation

## 2018-03-13 LAB — URINALYSIS, ROUTINE W REFLEX MICROSCOPIC
Bilirubin Urine: NEGATIVE
Glucose, UA: NEGATIVE mg/dL
HGB URINE DIPSTICK: NEGATIVE
Ketones, ur: NEGATIVE mg/dL
Nitrite: NEGATIVE
Protein, ur: NEGATIVE mg/dL
SPECIFIC GRAVITY, URINE: 1.018 (ref 1.005–1.030)
pH: 7 (ref 5.0–8.0)

## 2018-03-13 NOTE — MAU Note (Signed)
Presents with c/o chest pain that's in the middle to the left side and SOB that began Tuesday.  Pt reports numbness radiates down mid left arm into palm & two middle fingers.   Denies VB, LOF, or abdominal cramping.

## 2018-03-13 NOTE — Discharge Instructions (Signed)
Chest Wall Pain °Chest wall pain is pain in or around the bones and muscles of your chest. Sometimes, an injury causes this pain. Sometimes, the cause may not be known. This pain may take several weeks or longer to get better. °Follow these instructions at home: °Pay attention to any changes in your symptoms. Take these actions to help with your pain: °· Rest as told by your health care provider. °· Avoid activities that cause pain. These include any activities that use your chest muscles or your abdominal and side muscles to lift heavy items. °· If directed, apply ice to the painful area: °? Put ice in a plastic bag. °? Place a towel between your skin and the bag. °? Leave the ice on for 20 minutes, 2-3 times per day. °· Take over-the-counter and prescription medicines only as told by your health care provider. °· Do not use tobacco products, including cigarettes, chewing tobacco, and e-cigarettes. If you need help quitting, ask your health care provider. °· Keep all follow-up visits as told by your health care provider. This is important. ° °Contact a health care provider if: °· You have a fever. °· Your chest pain becomes worse. °· You have new symptoms. °Get help right away if: °· You have nausea or vomiting. °· You feel sweaty or light-headed. °· You have a cough with phlegm (sputum) or you cough up blood. °· You develop shortness of breath. °This information is not intended to replace advice given to you by your health care provider. Make sure you discuss any questions you have with your health care provider. °Document Released: 03/26/2005 Document Revised: 08/04/2015 Document Reviewed: 06/21/2014 °Elsevier Interactive Patient Education © 2018 Elsevier Inc. ° °

## 2018-03-13 NOTE — MAU Provider Note (Signed)
  History     CSN: [id number]  Arrival date and time: 03/13/18 1037   First Provider Initiated Contact with Patient 03/13/18 1112      Chief Complaint  Patient presents with  . Shortness of Breath  . Chest Pain   Presents for 2 day history of left sided chest pain and numbness from elbow to palm States that pain feels sharp Feels better when she holds that side of her chest Not feeling SOB, but pain worse with deep breathing Denies heartburn, SOB with exertion, CP with exertion or LEE No history of HTN, DM or heart disease in patient or first degree relatives No family history of sudden cardiac death Pt has a history of syncopal episodes that have worsened during pregnancy Also has numbness in L arm from anterior elbow to middle and ring finger No weakness or pain in L arm    Past Medical History:  Diagnosis Date  . Chest pain   . Head ache   . Syncope   . Vasovagal syncope     History reviewed. No pertinent surgical history.  Family History  Problem Relation Age of Onset  . Osteoporosis Maternal Grandmother   . Diabetes Maternal Grandfather   . Heart disease Paternal Grandmother     Social History   Tobacco Use  . Smoking status: Never Smoker  . Smokeless tobacco: Never Used  Substance Use Topics  . Alcohol use: Yes    Alcohol/week: 1.0 standard drinks    Types: 1 Standard drinks or equivalent per week  . Drug use: No    Allergies:  Allergies  Allergen Reactions  . Hydrocodone-Acetaminophen     REACTION: nausea    No medications prior to admission.    Review of Systems  All other systems reviewed and are negative.  Physical Exam   Blood pressure 122/77, pulse 100, temperature 98.6 F (37 C), temperature source Oral, resp. rate 20, height 5\' 7"  (1.702 m), weight 75 kg, last menstrual period 11/02/2017, SpO2 98 %.  Physical Exam  Constitutional: She is oriented to person, place, and time. She appears well-developed and well-nourished. No  distress.  HENT:  Head: Normocephalic and atraumatic.  Eyes: Pupils are equal, round, and reactive to light. Right eye exhibits no discharge. Left eye exhibits no discharge. No scleral icterus.  Cardiovascular: Normal rate and regular rhythm.  Respiratory: Effort normal. No respiratory distress.  Neurological: She is alert and oriented to person, place, and time.  Skin: She is not diaphoretic.  Psychiatric: She has a normal mood and affect. Her behavior is normal. Judgment and thought content normal.    MAU Course  Procedures  MDM EKG with normal sinus rhythm and occasional PVCs  Assessment and Plan  Left-sided chest wall pain - Plan: Discharge patient - unlikely cardiac origin - recommend tylenol for pain  - follow up with PCP - return precautions discussed in detail    Gwenevere AbbotNimeka Mariaeduarda Defranco 03/13/2018, 11:35 AM

## 2018-04-07 DIAGNOSIS — R8290 Unspecified abnormal findings in urine: Secondary | ICD-10-CM | POA: Diagnosis not present

## 2018-04-09 NOTE — L&D Delivery Note (Signed)
Delivery Note Pt with increased pressure, found to be complete/complete/ +2.  At 7:46 AM a viable and healthy female was delivered via Vaginal, Spontaneous (Presentation: OA; LOT  ).  APGAR: 9, 9; weight P .   Placenta status: delivered, intact.  Placenta to pathology.  Cord: 3V with the following complications: none.    Anesthesia:  epidural Episiotomy: None Lacerations:  abrasion Suture Repair: N/A Est. Blood Loss (mL):  125  Mom to postpartum.  Baby to Couplet care / Skin to Skin.  Ariel Hale 07/25/2018, 8:04 AM  Br/O+/RI/Tdap in PNC/ Contra ?  Pt desires circumcision for female infant, d/w pt r/b/a and process.

## 2018-05-13 DIAGNOSIS — Z3689 Encounter for other specified antenatal screening: Secondary | ICD-10-CM | POA: Diagnosis not present

## 2018-05-13 DIAGNOSIS — Z23 Encounter for immunization: Secondary | ICD-10-CM | POA: Diagnosis not present

## 2018-06-02 DIAGNOSIS — R319 Hematuria, unspecified: Secondary | ICD-10-CM | POA: Diagnosis not present

## 2018-06-02 DIAGNOSIS — Z3A30 30 weeks gestation of pregnancy: Secondary | ICD-10-CM | POA: Diagnosis not present

## 2018-06-02 DIAGNOSIS — Z362 Encounter for other antenatal screening follow-up: Secondary | ICD-10-CM | POA: Diagnosis not present

## 2018-06-02 DIAGNOSIS — O4443 Low lying placenta NOS or without hemorrhage, third trimester: Secondary | ICD-10-CM | POA: Diagnosis not present

## 2018-07-10 DIAGNOSIS — Z3A36 36 weeks gestation of pregnancy: Secondary | ICD-10-CM | POA: Diagnosis not present

## 2018-07-10 DIAGNOSIS — O365930 Maternal care for other known or suspected poor fetal growth, third trimester, not applicable or unspecified: Secondary | ICD-10-CM | POA: Diagnosis not present

## 2018-07-10 DIAGNOSIS — Z3685 Encounter for antenatal screening for Streptococcus B: Secondary | ICD-10-CM | POA: Diagnosis not present

## 2018-07-10 LAB — OB RESULTS CONSOLE GBS: GBS: POSITIVE

## 2018-07-15 DIAGNOSIS — O365930 Maternal care for other known or suspected poor fetal growth, third trimester, not applicable or unspecified: Secondary | ICD-10-CM | POA: Diagnosis not present

## 2018-07-15 DIAGNOSIS — Z3A36 36 weeks gestation of pregnancy: Secondary | ICD-10-CM | POA: Diagnosis not present

## 2018-07-17 DIAGNOSIS — Z3A37 37 weeks gestation of pregnancy: Secondary | ICD-10-CM | POA: Diagnosis not present

## 2018-07-17 DIAGNOSIS — O26843 Uterine size-date discrepancy, third trimester: Secondary | ICD-10-CM | POA: Diagnosis not present

## 2018-07-21 DIAGNOSIS — O365930 Maternal care for other known or suspected poor fetal growth, third trimester, not applicable or unspecified: Secondary | ICD-10-CM | POA: Diagnosis not present

## 2018-07-21 DIAGNOSIS — Z3A37 37 weeks gestation of pregnancy: Secondary | ICD-10-CM | POA: Diagnosis not present

## 2018-07-24 ENCOUNTER — Encounter (HOSPITAL_COMMUNITY): Payer: Self-pay | Admitting: *Deleted

## 2018-07-24 ENCOUNTER — Inpatient Hospital Stay (HOSPITAL_COMMUNITY)
Admission: AD | Admit: 2018-07-24 | Discharge: 2018-07-27 | DRG: 807 | Disposition: A | Discharge status: Home / Self Care | Payer: BLUE CROSS/BLUE SHIELD | Attending: Obstetrics and Gynecology | Admitting: Obstetrics and Gynecology

## 2018-07-24 DIAGNOSIS — O99824 Streptococcus B carrier state complicating childbirth: Secondary | ICD-10-CM | POA: Diagnosis not present

## 2018-07-24 DIAGNOSIS — O365931 Maternal care for other known or suspected poor fetal growth, third trimester, fetus 1: Secondary | ICD-10-CM | POA: Diagnosis present

## 2018-07-24 DIAGNOSIS — O365930 Maternal care for other known or suspected poor fetal growth, third trimester, not applicable or unspecified: Secondary | ICD-10-CM | POA: Diagnosis not present

## 2018-07-24 DIAGNOSIS — O358XX0 Maternal care for other (suspected) fetal abnormality and damage, not applicable or unspecified: Secondary | ICD-10-CM | POA: Diagnosis present

## 2018-07-24 DIAGNOSIS — Z3A00 Weeks of gestation of pregnancy not specified: Secondary | ICD-10-CM | POA: Diagnosis not present

## 2018-07-24 DIAGNOSIS — Z3A38 38 weeks gestation of pregnancy: Secondary | ICD-10-CM | POA: Diagnosis not present

## 2018-07-24 DIAGNOSIS — O09299 Supervision of pregnancy with other poor reproductive or obstetric history, unspecified trimester: Secondary | ICD-10-CM | POA: Diagnosis present

## 2018-07-24 LAB — COMPREHENSIVE METABOLIC PANEL
ALT: 12 U/L (ref 0–44)
AST: 17 U/L (ref 15–41)
Albumin: 2.7 g/dL — ABNORMAL LOW (ref 3.5–5.0)
Alkaline Phosphatase: 131 U/L — ABNORMAL HIGH (ref 38–126)
Anion gap: 10 (ref 5–15)
BUN: 9 mg/dL (ref 6–20)
CO2: 18 mmol/L — ABNORMAL LOW (ref 22–32)
Calcium: 9.3 mg/dL (ref 8.9–10.3)
Chloride: 108 mmol/L (ref 98–111)
Creatinine, Ser: 0.65 mg/dL (ref 0.44–1.00)
GFR calc Af Amer: >60 mL/min (ref >60)
GFR calc non Af Amer: >60 mL/min (ref >60)
Glucose, Bld: 78 mg/dL (ref 70–99)
Potassium: 4.2 mmol/L (ref 3.5–5.1)
Sodium: 136 mmol/L (ref 135–145)
Total Bilirubin: 0.4 mg/dL (ref 0.3–1.2)
Total Protein: 5.9 g/dL — ABNORMAL LOW (ref 6.5–8.1)

## 2018-07-24 LAB — PROTEIN / CREATININE RATIO, URINE
Creatinine, Urine: 140.67 mg/dL
Protein Creatinine Ratio: 0.1 mg/mg{Cre} (ref 0.00–0.15)
Total Protein, Urine: 14 mg/dL

## 2018-07-24 LAB — CBC
HCT: 36.3 % (ref 36.0–46.0)
Hemoglobin: 11.6 g/dL — ABNORMAL LOW (ref 12.0–15.0)
MCH: 29.2 pg (ref 26.0–34.0)
MCHC: 32 g/dL (ref 30.0–36.0)
MCV: 91.4 fL (ref 80.0–100.0)
Platelets: 256 10*3/uL (ref 150–400)
RBC: 3.97 MIL/uL (ref 3.87–5.11)
RDW: 12.9 % (ref 11.5–15.5)
WBC: 9.2 10*3/uL (ref 4.0–10.5)
nRBC: 0 % (ref 0.0–0.2)

## 2018-07-24 LAB — TYPE AND SCREEN
ABO/RH(D): O POS
Antibody Screen: NEGATIVE

## 2018-07-24 LAB — ABO/RH: ABO/RH(D): O POS

## 2018-07-24 MED ORDER — LABETALOL HCL 5 MG/ML IV SOLN: 80.0000 mg | INTRAVENOUS | Status: DC | PRN

## 2018-07-24 MED ORDER — SOD CITRATE-CITRIC ACID 500-334 MG/5ML PO SOLN: 30.0000 mL | ORAL | Status: DC | PRN

## 2018-07-24 MED ORDER — LACTATED RINGERS IV SOLN: 500.0000 mL | INTRAVENOUS | Status: DC | PRN

## 2018-07-24 MED ORDER — TERBUTALINE SULFATE 1 MG/ML IJ SOLN: 0.2500 mg | Freq: Once | INTRAMUSCULAR | Status: DC | PRN

## 2018-07-24 MED ORDER — PHENYLEPHRINE 40 MCG/ML (10ML) SYRINGE FOR IV PUSH (FOR BLOOD PRESSURE SUPPORT)
80.0000 ug | PREFILLED_SYRINGE | INTRAVENOUS | Status: DC | PRN
  Filled 2018-07-24: qty 10

## 2018-07-24 MED ORDER — SODIUM CHLORIDE 0.9 % IV SOLN
5.0000 10*6.[IU] | Freq: Once | INTRAVENOUS | Status: AC
  Administered 2018-07-24: 5 10*6.[IU] via INTRAVENOUS
  Filled 2018-07-24: qty 5

## 2018-07-24 MED ORDER — EPHEDRINE 5 MG/ML INJ: 10.0000 mg | INTRAVENOUS | Status: DC | PRN

## 2018-07-24 MED ORDER — DIPHENHYDRAMINE HCL 50 MG/ML IJ SOLN: 12.5000 mg | INTRAMUSCULAR | Status: DC | PRN

## 2018-07-24 MED ORDER — PHENYLEPHRINE 40 MCG/ML (10ML) SYRINGE FOR IV PUSH (FOR BLOOD PRESSURE SUPPORT): 80.0000 ug | PREFILLED_SYRINGE | INTRAVENOUS | Status: DC | PRN

## 2018-07-24 MED ORDER — LIDOCAINE HCL (PF) 1 % IJ SOLN
30.0000 mL | INTRAMUSCULAR | Status: DC | PRN
  Filled 2018-07-24: qty 30

## 2018-07-24 MED ORDER — OXYTOCIN 40 UNITS IN NORMAL SALINE INFUSION - SIMPLE MED: 2.5000 [IU]/h | INTRAVENOUS | Status: DC

## 2018-07-24 MED ORDER — ONDANSETRON HCL 4 MG/2ML IJ SOLN
4.0000 mg | Freq: Four times a day (QID) | INTRAMUSCULAR | Status: DC | PRN
  Administered 2018-07-25: 4 mg via INTRAVENOUS
  Filled 2018-07-24: qty 2

## 2018-07-24 MED ORDER — LACTATED RINGERS IV SOLN
500.0000 mL | Freq: Once | INTRAVENOUS | Status: AC
  Administered 2018-07-25: 500 mL via INTRAVENOUS

## 2018-07-24 MED ORDER — OXYCODONE-ACETAMINOPHEN 5-325 MG PO TABS: 1.0000 | ORAL_TABLET | ORAL | Status: DC | PRN

## 2018-07-24 MED ORDER — LABETALOL HCL 5 MG/ML IV SOLN: 20.0000 mg | INTRAVENOUS | Status: DC | PRN

## 2018-07-24 MED ORDER — FENTANYL-BUPIVACAINE-NACL 0.5-0.125-0.9 MG/250ML-% EP SOLN
12.0000 mL/h | EPIDURAL | Status: DC | PRN
  Filled 2018-07-24: qty 250

## 2018-07-24 MED ORDER — LABETALOL HCL 5 MG/ML IV SOLN: 40.0000 mg | INTRAVENOUS | Status: DC | PRN

## 2018-07-24 MED ORDER — OXYTOCIN 40 UNITS IN NORMAL SALINE INFUSION - SIMPLE MED
1.0000 m[IU]/min | INTRAVENOUS | Status: DC
  Filled 2018-07-24: qty 1000

## 2018-07-24 MED ORDER — OXYTOCIN 40 UNITS IN NORMAL SALINE INFUSION - SIMPLE MED
1.0000 m[IU]/min | INTRAVENOUS | Status: DC
  Administered 2018-07-24: 2 m[IU]/min via INTRAVENOUS

## 2018-07-24 MED ORDER — LACTATED RINGERS IV SOLN
INTRAVENOUS | Status: DC
  Administered 2018-07-24: 125 mL/h via INTRAVENOUS
  Administered 2018-07-25 (×2): via INTRAVENOUS

## 2018-07-24 MED ORDER — OXYCODONE-ACETAMINOPHEN 5-325 MG PO TABS: 2.0000 | ORAL_TABLET | ORAL | Status: DC | PRN

## 2018-07-24 MED ORDER — BUTORPHANOL TARTRATE 1 MG/ML IJ SOLN: 1.0000 mg | INTRAMUSCULAR | Status: DC | PRN

## 2018-07-24 MED ORDER — HYDRALAZINE HCL 20 MG/ML IJ SOLN: 10.0000 mg | INTRAMUSCULAR | Status: DC | PRN

## 2018-07-24 MED ORDER — PENICILLIN G 3 MILLION UNITS IVPB - SIMPLE MED
3.0000 10*6.[IU] | INTRAVENOUS | Status: DC
  Administered 2018-07-24 – 2018-07-25 (×2): 3 10*6.[IU] via INTRAVENOUS
  Filled 2018-07-24 (×2): qty 100

## 2018-07-24 MED ORDER — ACETAMINOPHEN 325 MG PO TABS: 650.0000 mg | ORAL_TABLET | ORAL | Status: DC | PRN

## 2018-07-24 MED ORDER — OXYTOCIN BOLUS FROM INFUSION: 500.0000 mL | Freq: Once | INTRAVENOUS | Status: DC

## 2018-07-24 NOTE — H&P (Signed)
Ariel Hale is a 28 y.o. female, G1P0, EGA 38+ weeks with [facility] 4-29 presenting for induction for IUGR.  Ultrasound on 4-2, EFW 13%ile, AFI 13, BPP 8/8, nl UA dopplers.  Ultrasound today with EFW 2%ile, 2346 gms, AFI 12, BPP 8/8, nl UA dopplers.  Also with bilateral CP cysts.  OB History    Gravida  1   Para      Term      Preterm      AB      Living        SAB      TAB      Ectopic      Multiple      Live Births             Past Medical History:  Diagnosis Date  . Chest pain   . Head ache   . Syncope   . Vasovagal syncope    History reviewed. No pertinent surgical history. Family History: family history includes Diabetes in her maternal grandfather; Heart disease in her paternal grandmother; Osteoporosis in her maternal grandmother. Social History:  reports that she has never smoked. She has never used smokeless tobacco. She reports current alcohol use of about 1.0 standard drinks of alcohol per week. She reports that she does not use drugs.     Maternal Diabetes: No Genetic Screening: Declined Maternal Ultrasounds/Referrals: Abnormal:  Findings:   Isolated choroid plexus cyst Fetal Ultrasounds or other Referrals:  None Maternal Substance Abuse:  No Significant Maternal Medications:  None Significant Maternal Lab Results:  Lab values include: Group B Strep positive Other Comments:  None  Review of Systems  Respiratory: Negative.   Cardiovascular: Negative.    Maternal Medical History:  Contractions: Frequency: rare.   Perceived severity is mild.    Fetal activity: Perceived fetal activity is normal.    Prenatal complications: IUGR.   Prenatal Complications - Diabetes: none.    Dilation: 3 Effacement (%): 50 Station: -2 Exam by:: Dr. Jackelyn Hale Blood pressure (!) 145/93, pulse 86, resp. rate 16, height 5\' 7"  (1.702 m), weight 85.7 kg, last menstrual period 10/30/2017. Maternal Exam:  Uterine Assessment: Contraction strength is mild.   Contraction frequency is rare.   Abdomen: Patient reports no abdominal tenderness. Estimated fetal weight is 6 lbs.   Fetal presentation: vertex  Introitus: Normal vulva. Normal vagina.  Amniotic fluid character: not assessed.  Pelvis: adequate for delivery.   Cervix: Cervix evaluated by digital exam.     Fetal Exam Fetal Monitor Review: Mode: ultrasound.   Baseline rate: 120-130.  Variability: moderate (6-25 bpm).   Pattern: accelerations present and no decelerations.    Fetal State Assessment: Category I - tracings are normal.     Physical Exam  Vitals reviewed. Constitutional: She appears well-developed and well-nourished.  Cardiovascular: Normal rate and regular rhythm.  Respiratory: Effort normal. No respiratory distress.  GI: Soft.  Genitourinary:    Vulva normal.     Prenatal labs: ABO, Rh:  O pos Antibody:  neg Rubella:  immune RPR:   NR HBsAg:   neg HIV:   NR GBS:   pos  Assessment/Plan: IUP at 38+ weeks with IUGR, mildly elevated BP, +GBS.  Will check PIH labs, start pitocin induction, PCN for GBS.  Advised with IUGR baby might not tolerate labor, will monitor closely.   Ariel Hale 07/24/2018, 6:46 PM

## 2018-07-25 ENCOUNTER — Inpatient Hospital Stay (HOSPITAL_COMMUNITY): Payer: BLUE CROSS/BLUE SHIELD | Admitting: Anesthesiology

## 2018-07-25 ENCOUNTER — Encounter (HOSPITAL_COMMUNITY): Payer: Self-pay | Admitting: Anesthesiology

## 2018-07-25 LAB — CBC
HCT: 33.9 % — ABNORMAL LOW (ref 36.0–46.0)
Hemoglobin: 10.8 g/dL — ABNORMAL LOW (ref 12.0–15.0)
MCH: 29.3 pg (ref 26.0–34.0)
MCHC: 31.9 g/dL (ref 30.0–36.0)
MCV: 91.9 fL (ref 80.0–100.0)
Platelets: 236 10*3/uL (ref 150–400)
RBC: 3.69 MIL/uL — ABNORMAL LOW (ref 3.87–5.11)
RDW: 12.9 % (ref 11.5–15.5)
WBC: 9.5 10*3/uL (ref 4.0–10.5)
nRBC: 0 % (ref 0.0–0.2)

## 2018-07-25 LAB — RPR: RPR Ser Ql: NONREACTIVE

## 2018-07-25 MED ORDER — BENZOCAINE-MENTHOL 20-0.5 % EX AERO
1.0000 "application " | INHALATION_SPRAY | CUTANEOUS | Status: DC | PRN
  Administered 2018-07-25: 1 via TOPICAL
  Filled 2018-07-25: qty 56

## 2018-07-25 MED ORDER — WITCH HAZEL-GLYCERIN EX PADS
1.0000 "application " | MEDICATED_PAD | CUTANEOUS | Status: DC | PRN
  Administered 2018-07-26: 1 via TOPICAL

## 2018-07-25 MED ORDER — ONDANSETRON HCL 4 MG/2ML IJ SOLN: 4.0000 mg | INTRAMUSCULAR | Status: DC | PRN

## 2018-07-25 MED ORDER — SODIUM CHLORIDE (PF) 0.9 % IJ SOLN
INTRAMUSCULAR | Status: DC | PRN
  Administered 2018-07-25: 12 mL/h via EPIDURAL

## 2018-07-25 MED ORDER — PRENATAL MULTIVITAMIN CH
1.0000 | ORAL_TABLET | Freq: Every day | ORAL | Status: DC
  Administered 2018-07-25 – 2018-07-27 (×3): 1 via ORAL
  Filled 2018-07-25 (×3): qty 1

## 2018-07-25 MED ORDER — COCONUT OIL OIL
1.0000 "application " | TOPICAL_OIL | Status: DC | PRN
  Administered 2018-07-26: 1 via TOPICAL

## 2018-07-25 MED ORDER — IBUPROFEN 600 MG PO TABS
600.0000 mg | ORAL_TABLET | Freq: Four times a day (QID) | ORAL | Status: DC
  Administered 2018-07-25 – 2018-07-27 (×7): 600 mg via ORAL
  Filled 2018-07-25 (×8): qty 1

## 2018-07-25 MED ORDER — DIPHENHYDRAMINE HCL 25 MG PO CAPS: 25.0000 mg | ORAL_CAPSULE | Freq: Four times a day (QID) | ORAL | Status: DC | PRN

## 2018-07-25 MED ORDER — TETANUS-DIPHTH-ACELL PERTUSSIS 5-2.5-18.5 LF-MCG/0.5 IM SUSP: 0.5000 mL | Freq: Once | INTRAMUSCULAR | Status: DC

## 2018-07-25 MED ORDER — LACTATED RINGERS IV SOLN: INTRAVENOUS | Status: DC

## 2018-07-25 MED ORDER — DIBUCAINE (PERIANAL) 1 % EX OINT
1.0000 "application " | TOPICAL_OINTMENT | CUTANEOUS | Status: DC | PRN
  Administered 2018-07-26: 1 via RECTAL
  Filled 2018-07-25: qty 28

## 2018-07-25 MED ORDER — SENNOSIDES-DOCUSATE SODIUM 8.6-50 MG PO TABS
2.0000 | ORAL_TABLET | ORAL | Status: DC
  Administered 2018-07-26: 2 via ORAL
  Filled 2018-07-25 (×2): qty 2

## 2018-07-25 MED ORDER — ACETAMINOPHEN 325 MG PO TABS: 650.0000 mg | ORAL_TABLET | ORAL | Status: DC | PRN

## 2018-07-25 MED ORDER — SIMETHICONE 80 MG PO CHEW: 80.0000 mg | CHEWABLE_TABLET | ORAL | Status: DC | PRN

## 2018-07-25 MED ORDER — ONDANSETRON HCL 4 MG PO TABS: 4.0000 mg | ORAL_TABLET | ORAL | Status: DC | PRN

## 2018-07-25 MED ORDER — ZOLPIDEM TARTRATE 5 MG PO TABS: 5.0000 mg | ORAL_TABLET | Freq: Every evening | ORAL | Status: DC | PRN

## 2018-07-25 MED ORDER — LIDOCAINE HCL (PF) 1 % IJ SOLN
INTRAMUSCULAR | Status: DC | PRN
  Administered 2018-07-25 (×2): 6 mL via EPIDURAL

## 2018-07-25 MED ORDER — OXYCODONE HCL 5 MG PO TABS: 10.0000 mg | ORAL_TABLET | ORAL | Status: DC | PRN

## 2018-07-25 MED ORDER — OXYCODONE HCL 5 MG PO TABS: 5.0000 mg | ORAL_TABLET | ORAL | Status: DC | PRN

## 2018-07-25 NOTE — Anesthesia Preprocedure Evaluation (Signed)
Anesthesia Evaluation  Patient identified by MRN, date of birth, ID band Patient awake    Reviewed: Allergy & Precautions, H&P , NPO status , Patient's Chart, lab work & pertinent test results  Airway Mallampati: I  TM Distance: >3 FB Neck ROM: full    Dental no notable dental hx. (+) Teeth Intact   Pulmonary neg pulmonary ROS,    Pulmonary exam normal breath sounds clear to auscultation       Cardiovascular negative cardio ROS Normal cardiovascular exam Rhythm:regular Rate:Normal     Neuro/Psych    GI/Hepatic negative GI ROS, Neg liver ROS,   Endo/Other  negative endocrine ROS  Renal/GU negative Renal ROS  negative genitourinary   Musculoskeletal negative musculoskeletal ROS (+)   Abdominal Normal abdominal exam  (+)   Peds  Hematology negative hematology ROS (+)   Anesthesia Other Findings   Reproductive/Obstetrics (+) Pregnancy                             Anesthesia Physical Anesthesia Plan  ASA: II  Anesthesia Plan: Epidural   Post-op Pain Management:    Induction:   PONV Risk Score and Plan:   Airway Management Planned:   Additional Equipment:   Intra-op Plan:   Post-operative Plan:   Informed Consent: I have reviewed the patients History and Physical, chart, labs and discussed the procedure including the risks, benefits and alternatives for the proposed anesthesia with the patient or authorized representative who has indicated his/her understanding and acceptance.       Plan Discussed with:   Anesthesia Plan Comments:         Anesthesia Quick Evaluation

## 2018-07-25 NOTE — Anesthesia Postprocedure Evaluation (Signed)
Anesthesia Post Note  Patient: Ariel Hale  Procedure(s) Performed: AN AD HOC LABOR EPIDURAL     Patient location during evaluation: Mother Baby Anesthesia Type: Epidural Level of consciousness: awake and alert Pain management: pain level controlled Vital Signs Assessment: post-procedure vital signs reviewed and stable Respiratory status: spontaneous breathing, nonlabored ventilation and respiratory function stable Cardiovascular status: stable Postop Assessment: no headache, no backache and epidural receding Anesthetic complications: no Comments: Per telephone conversation    Last Vitals:  Vitals:   07/25/18 1530 07/25/18 2033  BP: 116/74 126/82  Pulse: 89 88  Resp: 20 18  Temp: 36.9 C 36.9 C  SpO2: 98%     Last Pain:  Vitals:   07/25/18 2036  TempSrc:   PainSc: 0-No pain   Pain Goal:                   Trellis Paganini

## 2018-07-25 NOTE — Progress Notes (Signed)
Feeling some ctx Afeb, VSS, BP 120-140/80-100 FHT- 110-120, Cat I, irreg ctx VE-3-4/70/-1 to -2, vtx, AROM clear PIH labs normal Continue pitocin, monitor BP and progress

## 2018-07-25 NOTE — Lactation Note (Signed)
This note was copied from a baby's chart. Lactation Consultation Note  Patient Name: Ariel Hale MIWOE'H Date: 07/25/2018   [name] 12 hours old. Infant SGA/IUGR infant less than 6 pounds.Early term infant. Assisted with breastfeeding on moms left breast in cross cradle hold.  Infant latches and breastfeeds well.  Mom eventually able to put hand under breast in a U shape and hold baby at breast and able to keep him feeding. Infant fell asleep/nipple in mouth.  Showed mom how to break the suction and take him off.  Assist with mom with doing some hand expression and spoon feeding on right breast. Infant started cuing and assist mom in latching him on right breast.  He would not latch.  Mom has flat nipple on right.  20 mm nipple shield applied and infant came off and on a few times and then latched.  Infant sucked a few times and fell asleep.  Assisted mom with pumping with DEBP . Mom reports she has a DEBP for home use. Urged mom to feed on cue and 8 or more times/day/hand express past breastfeedings and spoon  Feed back all emm to infant and pump with DEBP for 15 minutes for stimulation. Mom in agreement with this.  Left mom pumping.  Urged mom to call lactation as needed.  Maternal Data    Feeding Feeding Type: Breast Fed  LATCH Score Latch: Grasps breast easily, tongue down, lips flanged, rhythmical sucking.  Audible Swallowing: A few with stimulation  Type of Nipple: Flat  Comfort (Breast/Nipple): Soft / non-tender  Hold (Positioning): No assistance needed to correctly position infant at breast.  [facility] Score: 8  Interventions    Lactation Tools Discussed/Used     Consult Status      Neomia Dear 07/25/2018, 6:57 PM

## 2018-07-25 NOTE — Progress Notes (Signed)
Report given to Lucy Chris, RN to continue recovery of patient.

## 2018-07-25 NOTE — Anesthesia Procedure Notes (Signed)
Epidural Patient location during procedure: OB Start time: 07/25/2018 3:04 AM End time: 07/25/2018 3:07 AM  Staffing Anesthesiologist: Leilani Able, MD Performed: anesthesiologist   Preanesthetic Checklist Completed: patient identified, site marked, surgical consent, pre-op evaluation, timeout performed, IV checked, risks and benefits discussed and monitors and equipment checked  Epidural Patient position: sitting Prep: site prepped and draped and DuraPrep Patient monitoring: continuous pulse ox and blood pressure Approach: midline Location: L3-L4 Injection technique: LOR air  Needle:  Needle type: Tuohy  Needle gauge: 17 G Needle length: 9 cm and 9 Needle insertion depth: 5 cm cm Catheter type: closed end flexible Catheter size: 19 Gauge Catheter at skin depth: 10 cm Test dose: negative and Other  Assessment Sensory level: T9 Events: blood not aspirated, injection not painful, no injection resistance, negative IV test and no paresthesia  Additional Notes Reason for block:procedure for pain

## 2018-07-26 ENCOUNTER — Other Ambulatory Visit: Payer: Self-pay

## 2018-07-26 LAB — CBC
HCT: 30.2 % — ABNORMAL LOW (ref 36.0–46.0)
Hemoglobin: 9.9 g/dL — ABNORMAL LOW (ref 12.0–15.0)
MCH: 30.7 pg (ref 26.0–34.0)
MCHC: 32.8 g/dL (ref 30.0–36.0)
MCV: 93.5 fL (ref 80.0–100.0)
Platelets: 193 10*3/uL (ref 150–400)
RBC: 3.23 MIL/uL — ABNORMAL LOW (ref 3.87–5.11)
RDW: 13.2 % (ref 11.5–15.5)
WBC: 10.7 10*3/uL — ABNORMAL HIGH (ref 4.0–10.5)
nRBC: 0 % (ref 0.0–0.2)

## 2018-07-26 NOTE — Anesthesia Postprocedure Evaluation (Signed)
Anesthesia Post Note  Patient: Ariel Hale  Procedure(s) Performed: AN AD HOC LABOR EPIDURAL     Patient location during evaluation: Mother Baby Anesthesia Type: Epidural Level of consciousness: awake and alert Pain management: pain level controlled Vital Signs Assessment: post-procedure vital signs reviewed and stable Respiratory status: spontaneous breathing Cardiovascular status: stable Postop Assessment: no headache, patient able to bend at knees, no backache, no apparent nausea or vomiting, epidural receding, adequate PO intake, spinal receding and able to ambulate Anesthetic complications: no    Last Vitals:  Vitals:   07/26/18 0012 07/26/18 0608  BP: 129/82 116/79  Pulse: 74 78  Resp: 18 18  Temp: 36.8 C 37.2 C  SpO2:      Last Pain:  Vitals:   07/26/18 0608  TempSrc: Oral  PainSc: 0-No pain   Pain Goal:                   Salome Arnt

## 2018-07-26 NOTE — Progress Notes (Signed)
Post Partum Day 1 Subjective: no complaints, up ad lib, voiding, tolerating PO and nl lochia, pain controlled  Objective: Blood pressure 116/79, pulse 78, temperature 98.9 F (37.2 C), temperature source Oral, resp. rate 18, height 5\' 7"  (1.702 m), weight 85.7 kg, last menstrual period 10/30/2017, SpO2 98 %, unknown if currently breastfeeding.  Physical Exam:  General: alert and no distress Lochia: appropriate Uterine Fundus: firm   Recent Labs    07/25/18 0216 07/26/18 0434  HGB 10.8* 9.9*  HCT 33.9* 30.2*    Assessment/Plan: Plan for discharge tomorrow, Breastfeeding and Lactation consult.  Routine PP care.     LOS: 2 days   Ariel Hale 07/26/2018, 6:49 AM

## 2018-07-26 NOTE — Lactation Note (Signed)
This note was copied from a baby's chart. Lactation Consultation Note  Patient Name: Ariel Hale Date: 07/26/2018  P1, 39 hour female infant, ETI, less than 6 lbs at birth,  and weight loss -4% . Per parents, infant had 7 stools and 5 voids. Mom has very flat nipples and using 20 mm NS. Per mom, nipples are sore doesn't want latch infant at this time. Mom wanted to review hand expression, LC reviewed hand expression and mom used hand pump and expressed 5 ml of colostrum that was mixed with 20 ml of Similac Neosure with iron that dad gave to infant using a curve tip syringe. LC gave mom comfort gels and explained comfort gels cannot be used with coconut oil. Mom plans to put infant to breast at next feeding she just wanted at break.  Mom will continue to breastfeed  infant by hunger cues, 8 or more times within 24 hours wearing the 20 mm NS. Mom will breastfeed infant first then use hand pump/ or DEBP and give infant back EBM/ and or  formula  based on infant age/ hours of life. Mom will call Nurse or LC if assistance is still need for help with latching infant to breast.    Maternal Data    Feeding Feeding Type: (enc mom to call when brfeeds)  LATCH Score Latch: (call when brfeeds/ gave information on formulamount)                 Interventions    Lactation Tools Discussed/Used     Consult Status      Ariel Hale 07/26/2018, 11:30 PM

## 2018-07-26 NOTE — Lactation Note (Signed)
This note was copied from a baby's chart. Lactation Consultation Note  Patient Name: Ariel Hale Date: 07/26/2018 Reason for consult: Follow-up assessment;Early term 37-38.6wks;Primapara;1st time breastfeeding;Infant < 6lbs  P1 mother whose infant is now 42 hours old.  This is an ETI at 38+2 weeks weighing < 6 lbs.  Mother was holding baby on her chest when I arrived.  RN in room placing heel warmer for 24 hour labs.  Mother stated that breast feeding "is going better."  She stated that her nipples need some rest and she is using a NS.  RN assisted mother this a.m. with feeding and support.  Baby latches with the NS and mother feels a tug at the breast.   Encouraged her to continue feeding at least every three hours or sooner if he awakens and shows cues.  Mother stated that he does show feeding cues.  She will breast feed, followed by supplementation and pumping with the DEBP.  She will spoon feed/finger feed any EBM she obtains.  Supplementation is Similac 22 calorie and parents have used the curved tip syringe.  Mother has no questions regarding feeding plan.  Offered to assist with latching as needed and suggested mother call her RN/LC if she needs help.  Mother verbalized understanding.  Father present.   Maternal Data Formula Feeding for Exclusion: No Has patient been taught Hand Expression?: Yes Does the patient have breastfeeding experience prior to this delivery?: No  Feeding Feeding Type: Breast Fed  LATCH Score Latch: Repeated attempts needed to sustain latch, nipple held in mouth throughout feeding, stimulation needed to elicit sucking reflex.(did not use NS)  Audible Swallowing: A few with stimulation  Type of Nipple: Inverted  Comfort (Breast/Nipple): Filling, red/small blisters or bruises, mild/mod discomfort  Hold (Positioning): No assistance needed to correctly position infant at breast.  LATCH Score: 5  Interventions Interventions: Breast  feeding basics reviewed;DEBP(suggestes to use DEBP every feeding even if no NS)  Lactation Tools Discussed/Used     Consult Status Consult Status: Follow-up Date: 07/27/18 Follow-up type: In-patient    Ariel Hale 07/26/2018, 2:02 PM

## 2018-07-27 MED ORDER — PRENATAL MULTIVITAMIN CH: 2.0000 | ORAL_TABLET | Freq: Every day | ORAL | 3 refills | Status: DC

## 2018-07-27 MED ORDER — IBUPROFEN 600 MG PO TABS: 600.0000 mg | ORAL_TABLET | Freq: Four times a day (QID) | ORAL | 1 refills | Status: DC | PRN

## 2018-07-27 NOTE — Lactation Note (Signed)
This note was copied from a baby's chart. Lactation Consultation Note  Patient Name: Ariel Hale [id number] Date: 07/27/2018 Reason for consult: Follow-up assessment   Maternal Data    Feeding Feeding Type: (mother reports nipples to sore to breastfeed, she is pumping)  LATCH Score                   Interventions Interventions: Hand express;Expressed milk;Comfort gels;Hand pump;DEBP  Lactation Tools Discussed/Used     Consult Status Consult Status: Follow-up Date: 07/28/18 Follow-up type: In-patient    Stevan Born [facility] 07/27/2018, 12:38 PM

## 2018-07-27 NOTE — Discharge Summary (Signed)
OB Discharge Summary     Patient Name: Ariel Hale DOB: 01/20/1991 MRN: [id number]  Date of admission: 07/24/2018 Delivering MD: Sherian Rein   Date of discharge: 07/27/2018  Admitting diagnosis: Direct Admit Intrauterine pregnancy: [redacted]w[redacted]d     Secondary diagnosis:  Principal Problem:   SVD (spontaneous vaginal delivery) Active Problems:   IUGR (intrauterine growth restriction) affecting care of mother, third trimester, fetus 1  Additional problems: N/A     Discharge diagnosis: Term Pregnancy Delivered                                                                                                Post partum procedures:N/A  Augmentation: AROM and Pitocin  Complications: None  Hospital course:  Induction of Labor With Vaginal Delivery   28 y.o. yo G1P1001 at [redacted]w[redacted]d was admitted to the hospital 07/24/2018 for induction of labor.  Indication for induction: IUGR.  Patient had an uncomplicated labor course as follows: Membrane Rupture Time/Date: 1:45 AM ,07/25/2018   Intrapartum Procedures: Episiotomy: None [1]                                         Lacerations:  None [1]  Patient had delivery of a Viable infant.  Information for the patient's newborn:  Shawntavia, Mueck [[id number]]      07/25/2018  Details of delivery can be found in separate delivery note.  Patient had a routine postpartum course. Patient is discharged home 07/27/18.  Physical exam  Vitals:   07/26/18 0608 07/26/18 1700 07/26/18 2148 07/27/18 0557  BP: 116/79 125/89 130/84 (!) 121/94  Pulse: 78 85 85 76  Resp: 18 16 18 18   Temp: 98.9 F (37.2 C) 98.8 F (37.1 C) 98.5 F (36.9 C) 98.4 F (36.9 C)  TempSrc: Oral Oral Oral Oral  SpO2:   99%   Weight:      Height:       General: alert and no distress Lochia: appropriate Uterine Fundus: firm  Labs: Lab Results  Component Value Date   WBC 10.7 (H) 07/26/2018   HGB 9.9 (L) 07/26/2018   HCT 30.2 (L) 07/26/2018   MCV 93.5  07/26/2018   PLT 193 07/26/2018   CMP Latest Ref Rng & Units 07/24/2018  Glucose 70 - 99 mg/dL 78  BUN 6 - 20 mg/dL 9  Creatinine 0.35 - 2.48 mg/dL 1.85  Sodium 909 - 311 mmol/L 136  Potassium 3.5 - 5.1 mmol/L 4.2  Chloride 98 - 111 mmol/L 108  CO2 22 - 32 mmol/L 18(L)  Calcium 8.9 - 10.3 mg/dL 9.3  Total Protein 6.5 - 8.1 g/dL 5.9(L)  Total Bilirubin 0.3 - 1.2 mg/dL 0.4  Alkaline Phos 38 - 126 U/L 131(H)  AST 15 - 41 U/L 17  ALT 0 - 44 U/L 12    Discharge instruction: per After Visit Summary and "Baby and Me Booklet".  After visit meds:  Allergies as of 07/27/2018      Reactions   Hydrocodone-acetaminophen Nausea Only  Pt can tolerate Acetaminophen and requests not to take Percocet      Medication List    TAKE these medications   ibuprofen 600 MG tablet Commonly known as:  ADVIL Take 1 tablet (600 mg total) by mouth every 6 (six) hours as needed.   prenatal multivitamin Tabs tablet Take 2 tablets by mouth daily at 12 noon.       Diet: routine diet  Activity: Advance as tolerated. Pelvic rest for 6 weeks.   Outpatient follow up:6 weeks Follow up Appt:No future appointments. Follow up Visit:No follow-ups on file.  Postpartum contraception: Undecided  Newborn Data: Live born female  Birth Weight: 5 lb 7 oz (2465 g) APGAR: 9, 9  Newborn Delivery   Birth date/time:  07/25/2018 07:46:00 Delivery type:  Vaginal, Spontaneous     Baby Feeding: Breast Disposition:home with mother   07/27/2018 Sherian ReinJody Bovard-Stuckert, MD

## 2018-07-27 NOTE — Lactation Note (Signed)
This note was copied from a baby's chart. Lactation Consultation Note:  Infant is 50 hours old, 6% weight loss, less than 6 lbs.  Mother reports that her nipples are too sore to latch infant now. She has bilateral positional strips. Mother has been using a #20 NS due to flat nipples.  She is wearing shell that are making breast tissue more swollen at this time.  Mother was given a harmony hand pump with instructions.  Mother also has a hands free electric pump at home.  Advised to use the most comfortable and effective.   Mother reports that she plans to pump until nipples feel better. She has comfort gels. Advised to use exp breastmilk then comfort gels.   Mothers plan is to pump, supplement infant with ebm/formula. Mother has guidelines for supplementing infant.  Mother to pump every 2-3 hours for 15-20 mins on each breast.   Discussed treatment and prevention of engorgement.   Mother is aware of available LC services and 24/7 phone line for breastfeeding questions or concerns.  Discussed follow up with [facility] as an outpatient. Mother unsure but has the number to call.   Patient Name: Ariel Hale [id number] Date: 07/27/2018 Reason for consult: Follow-up assessment   Maternal Data    Feeding Feeding Type: (mother reports nipples to sore to breastfeed, she is pumping)  LATCH Score                   Interventions Interventions: Hand express;Expressed milk;Comfort gels;Hand pump;DEBP  Lactation Tools Discussed/Used     Consult Status Consult Status: Complete    Michel Bickers 07/27/2018, 10:10 AM

## 2018-07-27 NOTE — Progress Notes (Signed)
Post Partum Day 2 Subjective: no complaints, up ad lib, voiding, tolerating PO and nl lochia, pain controlled  Objective: Blood pressure (!) 121/94, pulse 76, temperature 98.4 F (36.9 C), temperature source Oral, resp. rate 18, height 5\' 7"  (1.702 m), weight 85.7 kg, last menstrual period 10/30/2017, SpO2 99 %, unknown if currently breastfeeding.  Physical Exam:  General: alert and no distress Lochia: appropriate Uterine Fundus: firm   Recent Labs    07/25/18 0216 07/26/18 0434  HGB 10.8* 9.9*  HCT 33.9* 30.2*    Assessment/Plan: Discharge home, Breastfeeding and Lactation consult.  Routine PP care.  D/C with motrin and PNV, f/u 6 weeks   LOS: 3 days   Ariel Hale 07/27/2018, 9:06 AM

## 2018-08-09 ENCOUNTER — Inpatient Hospital Stay (HOSPITAL_COMMUNITY): Admission: AD | Admit: 2018-08-09 | Payer: BLUE CROSS/BLUE SHIELD | Source: Home / Self Care

## 2018-09-09 DIAGNOSIS — Z3009 Encounter for other general counseling and advice on contraception: Secondary | ICD-10-CM | POA: Diagnosis not present

## 2018-09-09 DIAGNOSIS — Z1389 Encounter for screening for other disorder: Secondary | ICD-10-CM | POA: Diagnosis not present

## 2018-12-30 ENCOUNTER — Telehealth: Payer: Self-pay | Admitting: Family Medicine

## 2018-12-30 NOTE — Telephone Encounter (Signed)
Best number [phone] Pt called wanting to get [employer] health screening form filled out last appointment 12/2017.  She needs this prior to 9/30.  Can I work pt in for cpx

## 2018-12-30 NOTE — Telephone Encounter (Signed)
Appointment 9/29 °Pt aware °

## 2018-12-30 NOTE — Telephone Encounter (Signed)
Sure.  Work her in.

## 2019-01-06 ENCOUNTER — Other Ambulatory Visit: Payer: Self-pay

## 2019-01-06 ENCOUNTER — Ambulatory Visit (INDEPENDENT_AMBULATORY_CARE_PROVIDER_SITE_OTHER): Payer: BC Managed Care – PPO | Admitting: Family Medicine

## 2019-01-06 VITALS — BP 100/80 | HR 94 | Temp 98.4°F | Ht 67.0 in | Wt 161.5 lb

## 2019-01-06 DIAGNOSIS — R5383 Other fatigue: Secondary | ICD-10-CM

## 2019-01-06 DIAGNOSIS — F331 Major depressive disorder, recurrent, moderate: Secondary | ICD-10-CM | POA: Diagnosis not present

## 2019-01-06 DIAGNOSIS — Z862 Personal history of diseases of the blood and blood-forming organs and certain disorders involving the immune mechanism: Secondary | ICD-10-CM

## 2019-01-06 DIAGNOSIS — Z0000 Encounter for general adult medical examination without abnormal findings: Secondary | ICD-10-CM

## 2019-01-06 MED ORDER — SERTRALINE HCL 50 MG PO TABS: 50.0000 mg | ORAL_TABLET | Freq: Every day | ORAL | 3 refills | Status: DC

## 2019-01-06 MED ORDER — HYDROXYZINE HCL 10 MG PO TABS: 5.0000 mg | ORAL_TABLET | Freq: Three times a day (TID) | ORAL | 0 refills | Status: DC | PRN

## 2019-01-06 NOTE — Assessment & Plan Note (Signed)
Start sertraline at bedtime. Atarax as needed for anxiety.  Info for helpline number given.  Close follow up in 4 weeks.

## 2019-01-06 NOTE — Patient Instructions (Addendum)
Start sertraline 50 mg at bedtime. Can use atarax as needed for anxiety or panic.   Look into counsling for marriage counselor.  Please stop at the lab to have labs drawn.

## 2019-01-06 NOTE — Assessment & Plan Note (Signed)
Eval with labs. 
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
## 2019-01-07 ENCOUNTER — Other Ambulatory Visit (INDEPENDENT_AMBULATORY_CARE_PROVIDER_SITE_OTHER): Payer: BC Managed Care – PPO

## 2019-01-07 DIAGNOSIS — Z13220 Encounter for screening for lipoid disorders: Secondary | ICD-10-CM

## 2019-01-07 LAB — CBC WITH DIFFERENTIAL/PLATELET
Basophils Absolute: 0.1 10*3/uL (ref 0.0–0.1)
Basophils Relative: 1 % (ref 0.0–3.0)
Eosinophils Absolute: 0.3 10*3/uL (ref 0.0–0.7)
Eosinophils Relative: 4.4 % (ref 0.0–5.0)
HCT: 43.1 % (ref 36.0–46.0)
Hemoglobin: 14.4 g/dL (ref 12.0–15.0)
Lymphocytes Relative: 42.7 % (ref 12.0–46.0)
Lymphs Abs: 2.8 10*3/uL (ref 0.7–4.0)
MCHC: 33.5 g/dL (ref 30.0–36.0)
MCV: 91.6 fl (ref 78.0–100.0)
Monocytes Absolute: 0.7 10*3/uL (ref 0.1–1.0)
Monocytes Relative: 10.3 % (ref 3.0–12.0)
Neutro Abs: 2.7 10*3/uL (ref 1.4–7.7)
Neutrophils Relative %: 41.6 % — ABNORMAL LOW (ref 43.0–77.0)
Platelets: 310 10*3/uL (ref 150.0–400.0)
RBC: 4.7 Mil/uL (ref 3.87–5.11)
RDW: 12.7 % (ref 11.5–15.5)
WBC: 6.5 10*3/uL (ref 4.0–10.5)

## 2019-01-07 LAB — LIPID PANEL
Cholesterol: 164 mg/dL (ref 0–200)
HDL: 48.1 mg/dL (ref >39.00)
LDL Cholesterol: 96 mg/dL (ref 0–99)
NonHDL: 115.76
Total CHOL/HDL Ratio: 3
Triglycerides: 98 mg/dL (ref 0.0–149.0)
VLDL: 19.6 mg/dL (ref 0.0–40.0)

## 2019-01-07 LAB — COMPREHENSIVE METABOLIC PANEL
ALT: 18 U/L (ref 0–35)
AST: 16 U/L (ref 0–37)
Albumin: 4.3 g/dL (ref 3.5–5.2)
Alkaline Phosphatase: 51 U/L (ref 39–117)
BUN: 13 mg/dL (ref 6–23)
CO2: 29 mEq/L (ref 19–32)
Calcium: 9.9 mg/dL (ref 8.4–10.5)
Chloride: 103 mEq/L (ref 96–112)
Creatinine, Ser: 0.88 mg/dL (ref 0.40–1.20)
GFR: 76.49 mL/min (ref >60.00)
Glucose, Bld: 91 mg/dL (ref 70–99)
Potassium: 4.5 mEq/L (ref 3.5–5.1)
Sodium: 138 mEq/L (ref 135–145)
Total Bilirubin: 0.4 mg/dL (ref 0.2–1.2)
Total Protein: 7.2 g/dL (ref 6.0–8.3)

## 2019-01-07 LAB — T3, FREE: T3, Free: 3.2 pg/mL (ref 2.3–4.2)

## 2019-01-07 LAB — T4, FREE: Free T4: 0.8 ng/dL (ref 0.60–1.60)

## 2019-01-07 LAB — TSH: TSH: 1.09 u[IU]/mL (ref 0.35–4.50)

## 2019-01-28 ENCOUNTER — Other Ambulatory Visit: Payer: Self-pay | Admitting: Family Medicine

## 2019-02-03 ENCOUNTER — Ambulatory Visit: Payer: BC Managed Care – PPO | Admitting: Family Medicine

## 2019-02-05 ENCOUNTER — Ambulatory Visit: Payer: BC Managed Care – PPO | Admitting: Family Medicine

## 2019-12-25 ENCOUNTER — Encounter: Payer: Self-pay | Admitting: Adult Health

## 2019-12-25 ENCOUNTER — Ambulatory Visit (INDEPENDENT_AMBULATORY_CARE_PROVIDER_SITE_OTHER): Payer: BC Managed Care – PPO | Admitting: Adult Health

## 2019-12-25 ENCOUNTER — Other Ambulatory Visit: Payer: Self-pay

## 2019-12-25 VITALS — BP 132/83 | HR 98 | Ht 67.0 in | Wt 180.0 lb

## 2019-12-25 DIAGNOSIS — Z3A01 Less than 8 weeks gestation of pregnancy: Secondary | ICD-10-CM

## 2019-12-25 DIAGNOSIS — Z3201 Encounter for pregnancy test, result positive: Secondary | ICD-10-CM

## 2019-12-25 DIAGNOSIS — O3680X0 Pregnancy with inconclusive fetal viability, not applicable or unspecified: Secondary | ICD-10-CM

## 2019-12-25 LAB — POCT URINE PREGNANCY: Preg Test, Ur: POSITIVE — AB

## 2019-12-25 NOTE — Progress Notes (Signed)
°  Subjective:     Patient ID: Ariel Hale, female   DOB: February 14, 1991, 29 y.o.   MRN: [id number]  HPI Ariel Hale is a 29 year old white female,married, in for UPT, has missed a period and had 6+HPTs. Has 69 1/29 year old at home, was induced at 38 weeks for IUGR. PCP is Kerby Nora, MD.  Review of Systems +missed period with 6+HPTs Reviewed past medical,surgical, social and family history. Reviewed medications and allergies.     Objective:   Physical Exam BP 132/83 (BP Location: Left Arm, Patient Position: Sitting, Cuff Size: Normal)    Pulse 98    Ht 5\' 7"  (1.702 m)    Wt 180 lb (81.6 kg)    LMP 11/09/2019 (Exact Date)    BMI 28.19 kg/m UPT is +, about 6+4 weeks by LMP with EDD 08/15/20. Skin warm and dry. Neck: mid line trachea, normal thyroid, good ROM, no lymphadenopathy noted. Lungs: clear to ausculation bilaterally. Cardiovascular: regular rate and rhythm. Abdomen is soft and non tender AA is 2 Fall risk is low PHQ 9 score is 0  Upstream - 12/25/19 1217      Pregnancy Intention Screening   Does the patient want to become pregnant in the next year? Yes    Does the patient's partner want to become pregnant in the next year? Yes    Would the patient like to discuss contraceptive options today? N/A      Contraception Wrap Up   Current Method Pregnant/Seeking Pregnancy    End Method Pregnant/Seeking Pregnancy    Contraception Counseling Provided No             Assessment:     1. Positive pregnancy test Take OTC PNV  2. Less than [redacted] weeks gestation of pregnancy  3. Encounter to determine fetal viability of pregnancy, single or unspecified fetus Return in 3 weeks for dating 1218     Plan:     Review handout by [facility]

## 2019-12-29 ENCOUNTER — Telehealth: Payer: Self-pay | Admitting: Women's Health

## 2019-12-29 ENCOUNTER — Other Ambulatory Visit: Payer: Self-pay

## 2019-12-29 DIAGNOSIS — O209 Hemorrhage in early pregnancy, unspecified: Secondary | ICD-10-CM | POA: Diagnosis not present

## 2019-12-29 NOTE — Telephone Encounter (Signed)
Called patient back, no answer. Left voicemail that I am returning her call and that I also sent her a mychart message.

## 2019-12-29 NOTE — Telephone Encounter (Signed)
Pt has some spotting would like nurse to f/u

## 2019-12-29 NOTE — Progress Notes (Signed)
Patient states she started having light bleeding on Saturday along with intermittent mild cramping.  The bleeding stopped but has started again today.  She is concerned she may be having a miscarriage. HCG ordered.  Will notify patient's once results are back.

## 2019-12-30 LAB — BETA HCG QUANT (REF LAB): hCG Quant: 171483 m[IU]/mL

## 2020-01-19 ENCOUNTER — Other Ambulatory Visit: Payer: Self-pay | Admitting: Obstetrics & Gynecology

## 2020-01-19 DIAGNOSIS — O3680X0 Pregnancy with inconclusive fetal viability, not applicable or unspecified: Secondary | ICD-10-CM

## 2020-01-20 ENCOUNTER — Ambulatory Visit (INDEPENDENT_AMBULATORY_CARE_PROVIDER_SITE_OTHER): Payer: BC Managed Care – PPO

## 2020-01-20 ENCOUNTER — Other Ambulatory Visit: Payer: Self-pay

## 2020-01-20 DIAGNOSIS — Z3A10 10 weeks gestation of pregnancy: Secondary | ICD-10-CM | POA: Diagnosis not present

## 2020-01-20 DIAGNOSIS — O3680X0 Pregnancy with inconclusive fetal viability, not applicable or unspecified: Secondary | ICD-10-CM

## 2020-01-20 NOTE — Progress Notes (Signed)
[place] 10+2 wks,single IUP,CRL 36.07 mm,FHR 173 bpm,normal ovaries

## 2020-02-03 ENCOUNTER — Ambulatory Visit (INDEPENDENT_AMBULATORY_CARE_PROVIDER_SITE_OTHER): Payer: BC Managed Care – PPO | Admitting: Advanced Practice Midwife

## 2020-02-03 ENCOUNTER — Other Ambulatory Visit: Payer: BC Managed Care – PPO

## 2020-02-03 ENCOUNTER — Other Ambulatory Visit: Payer: Self-pay

## 2020-02-03 ENCOUNTER — Encounter: Payer: Self-pay | Admitting: Advanced Practice Midwife

## 2020-02-03 ENCOUNTER — Ambulatory Visit: Payer: BC Managed Care – PPO | Admitting: *Deleted

## 2020-02-03 VITALS — BP 125/84 | HR 82 | Wt 183.4 lb

## 2020-02-03 DIAGNOSIS — Z3490 Encounter for supervision of normal pregnancy, unspecified, unspecified trimester: Secondary | ICD-10-CM | POA: Insufficient documentation

## 2020-02-03 DIAGNOSIS — O09299 Supervision of pregnancy with other poor reproductive or obstetric history, unspecified trimester: Secondary | ICD-10-CM

## 2020-02-03 DIAGNOSIS — Z1389 Encounter for screening for other disorder: Secondary | ICD-10-CM

## 2020-02-03 DIAGNOSIS — Z3A12 12 weeks gestation of pregnancy: Secondary | ICD-10-CM

## 2020-02-03 DIAGNOSIS — Z331 Pregnant state, incidental: Secondary | ICD-10-CM

## 2020-02-03 DIAGNOSIS — Z3480 Encounter for supervision of other normal pregnancy, unspecified trimester: Secondary | ICD-10-CM

## 2020-02-03 LAB — POCT URINALYSIS DIPSTICK OB
Glucose, UA: NEGATIVE
Ketones, UA: NEGATIVE
Nitrite, UA: NEGATIVE
POC,PROTEIN,UA: NEGATIVE

## 2020-02-03 MED ORDER — ASPIRIN 81 MG PO CHEW: 162.0000 mg | CHEWABLE_TABLET | Freq: Every day | ORAL | 8 refills | Status: DC

## 2020-02-03 NOTE — Patient Instructions (Signed)
[facility], I greatly value your feedback.  If you receive a survey following your visit with [place] today, we appreciate you taking the time to fill it out.  Thanks, Philipp Deputy, CNM   [facility] ([address] [place], [place] [address]) Entrance C, located off of [address] 24/7 valet parking   Nausea & Vomiting  Have saltine crackers or pretzels by your bed and eat a few bites before you raise your head out of bed in the morning  Eat small frequent meals throughout the day instead of large meals  Drink plenty of fluids throughout the day to stay hydrated, just don't drink a lot of fluids with your meals.  This can make your stomach fill up faster making you feel sick  Do not brush your teeth right after you eat  Products with real ginger are good for nausea, like ginger ale and ginger hard candy Make sure it says made with real ginger!  Sucking on sour candy like lemon heads is also good for nausea  If your prenatal vitamins make you nauseated, take them at night so you will sleep through the nausea  Sea Bands  If you feel like you need medicine for the nausea & vomiting please let us know  If you are unable to keep any fluids or food down please let us know   Constipation  Drink plenty of fluid, preferably water, throughout the day  Eat foods high in fiber such as fruits, vegetables, and grains  Exercise, such as walking, is a good way to keep your bowels regular  Drink warm fluids, especially warm prune juice, or decaf coffee  Eat a 1/2 cup of real oatmeal (not instant), 1/2 cup applesauce, and 1/2-1 cup warm prune juice every day  If needed, you may take Colace (docusate sodium) stool softener once or twice a day to help keep the stool soft.   If you still are having problems with constipation, you may take Miralax once daily as needed to help keep your bowels regular.   Home Blood Pressure Monitoring for Patients   Your  provider has recommended that you check your blood pressure (BP) at least once a week at home. If you do not have a blood pressure cuff at home, one will be provided for you. Contact your provider if you have not received your monitor within 1 week.   Helpful Tips for Accurate Home Blood Pressure Checks   Don't smoke, exercise, or drink caffeine 30 minutes before checking your BP  Use the restroom before checking your BP (a full bladder can raise your pressure)  Relax in a comfortable upright chair  Feet on the ground  Left arm resting comfortably on a flat surface at the level of your heart  Legs uncrossed  Back supported  Sit quietly and don't talk  Place the cuff on your bare arm  Adjust snuggly, so that only two fingertips can fit between your skin and the top of the cuff  Check 2 readings separated by at least one minute  Keep a log of your BP readings  For a visual, please reference this diagram: http://ccnc.care/bpdiagram  Provider Name: [facility]     Phone: [phone]  Zone 1: ALL CLEAR  Continue to monitor your symptoms:   BP reading is less than 140 (top number) or less than 90 (bottom number)   No right upper stomach pain  No headaches or seeing  spots  No feeling nauseated or throwing up  No swelling in face and hands  Zone 2: CAUTION Call your doctor's office for any of the following:   BP reading is greater than 140 (top number) or greater than 90 (bottom number)   Stomach pain under your ribs in the middle or right side  Headaches or seeing spots  Feeling nauseated or throwing up  Swelling in face and hands  Zone 3: EMERGENCY  Seek immediate medical care if you have any of the following:   BP reading is greater than160 (top number) or greater than 110 (bottom number)  Severe headaches not improving with Tylenol  Serious difficulty catching your breath  Any worsening symptoms from Zone 2    First Trimester of Pregnancy The  first trimester of pregnancy is from week 1 until the end of week 12 (months 1 through 3). A week after a sperm fertilizes an egg, the egg will implant on the wall of the uterus. This embryo will begin to develop into a baby. Genes from you and your partner are forming the baby. The female genes determine whether the baby is a boy or a girl. At 6-8 weeks, the eyes and face are formed, and the heartbeat can be seen on ultrasound. At the end of 12 weeks, all the baby's organs are formed.  Now that you are pregnant, you will want to do everything you can to have a healthy baby. Two of the most important things are to get good prenatal care and to follow your health care provider's instructions. Prenatal care is all the medical care you receive before the baby's birth. This care will help prevent, find, and treat any problems during the pregnancy and childbirth. BODY CHANGES Your body goes through many changes during pregnancy. The changes vary from woman to woman.   You may gain or lose a couple of pounds at first.  You may feel sick to your stomach (nauseous) and throw up (vomit). If the vomiting is uncontrollable, call your health care provider.  You may tire easily.  You may develop headaches that can be relieved by medicines approved by your health care provider.  You may urinate more often. Painful urination may mean you have a bladder infection.  You may develop heartburn as a result of your pregnancy.  You may develop constipation because certain hormones are causing the muscles that push waste through your intestines to slow down.  You may develop hemorrhoids or swollen, bulging veins (varicose veins).  Your breasts may begin to grow larger and become tender. Your nipples may stick out more, and the tissue that surrounds them (areola) may become darker.  Your gums may bleed and may be sensitive to brushing and flossing.  Dark spots or blotches (chloasma, mask of pregnancy) may develop on  your face. This will likely fade after the baby is born.  Your menstrual periods will stop.  You may have a loss of appetite.  You may develop cravings for certain kinds of food.  You may have changes in your emotions from day to day, such as being excited to be pregnant or being concerned that something may go wrong with the pregnancy and baby.  You may have more vivid and strange dreams.  You may have changes in your hair. These can include thickening of your hair, rapid growth, and changes in texture. Some women also have hair loss during or after pregnancy, or hair that feels dry or thin. Your hair  will most likely return to normal after your baby is born. WHAT TO EXPECT AT YOUR PRENATAL VISITS During a routine prenatal visit:  You will be weighed to make sure you and the baby are growing normally.  Your blood pressure will be taken.  Your abdomen will be measured to track your baby's growth.  The fetal heartbeat will be listened to starting around week 10 or 12 of your pregnancy.  Test results from any previous visits will be discussed. Your health care provider may ask you:  How you are feeling.  If you are feeling the baby move.  If you have had any abnormal symptoms, such as leaking fluid, bleeding, severe headaches, or abdominal cramping.  If you have any questions. Other tests that may be performed during your first trimester include:  Blood tests to find your blood type and to check for the presence of any previous infections. They will also be used to check for low iron levels (anemia) and Rh antibodies. Later in the pregnancy, blood tests for diabetes will be done along with other tests if problems develop.  Urine tests to check for infections, diabetes, or protein in the urine.  An ultrasound to confirm the proper growth and development of the baby.  An amniocentesis to check for possible genetic problems.  Fetal screens for spina bifida and Down  syndrome.  You may need other tests to make sure you and the baby are doing well. HOME CARE INSTRUCTIONS  Medicines  Follow your health care provider's instructions regarding medicine use. Specific medicines may be either safe or unsafe to take during pregnancy.  Take your prenatal vitamins as directed.  If you develop constipation, try taking a stool softener if your health care provider approves. Diet  Eat regular, well-balanced meals. Choose a variety of foods, such as meat or vegetable-based protein, fish, milk and low-fat dairy products, vegetables, fruits, and whole grain breads and cereals. Your health care provider will help you determine the amount of weight gain that is right for you.  Avoid raw meat and uncooked cheese. These carry germs that can cause birth defects in the baby.  Eating four or five small meals rather than three large meals a day may help relieve nausea and vomiting. If you start to feel nauseous, eating a few soda crackers can be helpful. Drinking liquids between meals instead of during meals also seems to help nausea and vomiting.  If you develop constipation, eat more high-fiber foods, such as fresh vegetables or fruit and whole grains. Drink enough fluids to keep your urine clear or pale yellow. Activity and Exercise  Exercise only as directed by your health care provider. Exercising will help you:  Control your weight.  Stay in shape.  Be prepared for labor and delivery.  Experiencing pain or cramping in the lower abdomen or low back is a good sign that you should stop exercising. Check with your health care provider before continuing normal exercises.  Try to avoid standing for long periods of time. Move your legs often if you must stand in one place for a long time.  Avoid heavy lifting.  Wear low-heeled shoes, and practice good posture.  You may continue to have sex unless your health care provider directs you otherwise. Relief of Pain or  Discomfort  Wear a good support bra for breast tenderness.    Take warm sitz baths to soothe any pain or discomfort caused by hemorrhoids. Use hemorrhoid cream if your health care provider approves.  Rest with your legs elevated if you have leg cramps or low back pain.  If you develop varicose veins in your legs, wear support hose. Elevate your feet for 15 minutes, 3-4 times a day. Limit salt in your diet. Prenatal Care  Schedule your prenatal visits by the twelfth week of pregnancy. They are usually scheduled monthly at first, then more often in the last 2 months before delivery.  Write down your questions. Take them to your prenatal visits.  Keep all your prenatal visits as directed by your health care provider. Safety  Wear your seat belt at all times when driving.  Make a list of emergency phone numbers, including numbers for family, friends, the hospital, and police and fire departments. General Tips  Ask your health care provider for a referral to a local prenatal education class. Begin classes no later than at the beginning of month 6 of your pregnancy.  Ask for help if you have counseling or nutritional needs during pregnancy. Your health care provider can offer advice or refer you to specialists for help with various needs.  Do not use hot tubs, steam rooms, or saunas.  Do not douche or use tampons or scented sanitary pads.  Do not cross your legs for long periods of time.  Avoid cat litter boxes and soil used by cats. These carry germs that can cause birth defects in the baby and possibly loss of the fetus by miscarriage or stillbirth.  Avoid all smoking, herbs, alcohol, and medicines not prescribed by your health care provider. Chemicals in these affect the formation and growth of the baby.  Schedule a dentist appointment. At home, brush your teeth with a soft toothbrush and be gentle when you floss. SEEK MEDICAL CARE IF:   You have dizziness.  You have mild  pelvic cramps, pelvic pressure, or nagging pain in the abdominal area.  You have persistent nausea, vomiting, or diarrhea.  You have a bad smelling vaginal discharge.  You have pain with urination.  You notice increased swelling in your face, hands, legs, or ankles. SEEK IMMEDIATE MEDICAL CARE IF:   You have a fever.  You are leaking fluid from your vagina.  You have spotting or bleeding from your vagina.  You have severe abdominal cramping or pain.  You have rapid weight gain or loss.  You vomit blood or material that looks like coffee grounds.  You are exposed to [place] measles and have never had them.  You are exposed to fifth disease or chickenpox.  You develop a severe headache.  You have shortness of breath.  You have any kind of trauma, such as from a fall or a car accident. Document Released: 03/20/2001 Document Revised: 08/10/2013 Document Reviewed: 02/03/2013 [facility] Patient Information 2015 [place], [place]. This information is not intended to replace advice given to you by your health care provider. Make sure you discuss any questions you have with your health care provider.

## 2020-02-03 NOTE — Progress Notes (Signed)
INITIAL OBSTETRICAL VISIT Patient name: Ariel Hale MRN [id number]  Date of birth: Jan 11, 1991 Chief Complaint:   Initial Prenatal Visit  History of Present Illness:   Ariel Hale is a 29 y.o. G79P1001 female at [redacted]w[redacted]d by LMP c/w u/s at 10.2 weeks with an Estimated Date of Delivery: 08/15/20 being seen today for her initial obstetrical visit.   Her obstetrical history is significant for intrauterine growth restriction (IUGR).   Today she reports some nausea that is improved with PNV and chewing ginger gum; declines medications at this time.  Depression screen [facility] 2/9 02/03/2020 12/25/2019 01/06/2019  Decreased Interest 0 0 2  Down, Depressed, Hopeless 0 0 3  PHQ - 2 Score 0 0 5  Altered sleeping 0 0 3  Tired, decreased energy 2 0 1  Change in appetite 0 0 3  Feeling bad or failure about yourself  0 0 3  Trouble concentrating 0 0 1  Moving slowly or fidgety/restless 0 0 1  Suicidal thoughts 0 0 3  PHQ-9 Score 2 0 20  Difficult doing work/chores - Not difficult at all Very difficult    Patient's last menstrual period was 11/09/2019 (exact date). Last pap 2019. Results were: normal Review of Systems:   Pertinent items are noted in HPI Denies cramping/contractions, leakage of fluid, vaginal bleeding, abnormal vaginal discharge w/ itching/odor/irritation, headaches, visual changes, shortness of breath, chest pain, abdominal pain, severe nausea/vomiting, or problems with urination or bowel movements unless otherwise stated above.  Pertinent History Reviewed:  Reviewed past medical,surgical, social, obstetrical and family history.  Reviewed problem list, medications and allergies. OB History  Gravida Para Term Preterm AB Living  2 1 1     1   SAB TAB Ectopic Multiple Live Births        0 1    # Outcome Date GA Lbr Len/2nd Weight Sex Delivery Anes PTL Lv  2 Current           1 Term 07/25/18 [redacted]w[redacted]d / 00:38 5 lb 7 oz (2.465 kg) M Vag-Spont EPI N LIV     Complications: IUGR  (intrauterine growth retardation) of newborn   Physical Assessment:   Vitals:   02/03/20 1525  BP: 125/84  Pulse: 82  Weight: 183 lb 6.4 oz (83.2 kg)  Body mass index is 28.72 kg/m.       Physical Examination:  General appearance - well appearing, and in no distress  Mental status - alert, oriented to person, place, and time  Psych:  She has a normal mood and affect  Skin - warm and dry, normal color, no suspicious lesions noted  Chest - effort normal, all lung fields clear to auscultation bilaterally  Heart - normal rate and regular rhythm  Abdomen - soft, nontender; FHTs 160  Extremities:  No swelling or varicosities noted  Pelvic - VULVA: normal appearing vulva with no masses, tenderness or lesions  VAGINA: normal appearing vagina with normal color and discharge, no lesions  CERVIX: normal appearing cervix without discharge or lesions, no CMT  Thin prep pap is not done   TODAY'S NT : declines  Results for orders placed or performed in visit on 02/03/20 (from the past 24 hour(s))  POC Urinalysis Dipstick OB   Collection Time: 02/03/20  3:27 PM  Result Value Ref Range   Color, UA     Clarity, UA     Glucose, UA Negative Negative   Bilirubin, UA     Ketones, UA neg  Spec Grav, UA     Blood, UA small    pH, UA     POC,PROTEIN,UA Negative Negative, Trace, Small (1+), Moderate (2+), Large (3+), 4+   Urobilinogen, UA     Nitrite, UA neg    Leukocytes, UA Small (1+) (A) Negative   Appearance     Odor      Assessment & Plan:  1) Low-Risk Pregnancy G2P1001 at [redacted]w[redacted]d with an Estimated Date of Delivery: 08/15/20   2) Initial OB visit  3) Hx FGR, MyChart message sent to start bASA 162mg   Meds:  Meds ordered this encounter  Medications  . aspirin 81 MG chewable tablet    Sig: Chew 2 tablets (162 mg total) by mouth daily.    Dispense:  60 tablet    Refill:  8    Order Specific Question:   Supervising Provider    Answer:   H [[id number]]    Initial labs  obtained Continue prenatal vitamins Reviewed n/v relief measures and warning s/s to report Reviewed recommended weight gain based on pre-gravid BMI Encouraged well-balanced diet Genetic & carrier screening discussed: requests Panorama, declines NT/IT and Horizon 14  Ultrasound discussed; fetal survey: requested [facility] completed> form faxed if has or is planning to apply for medicaid The nature of [facility] with multiple MDs and other Advanced Practice Providers was explained to patient; also emphasized that fellows, residents, and students are part of our team. Given home bp cuff. Check bp weekly, let [employer] know if >140/90.   Indications for ASA therapy (per uptodate)  OR Two or more of the following: Obesity (BMI>30 kg/m2) Yes Personal risk factors (eg, previous pregnancy w/ LBW or SGA, previous adverse pregnancy outcome [eg, stillbirth], interval >10 years between pregnancies) Yes  No indications for early A1C (per uptodate)  Follow-up: Return in about 4 weeks (around 03/02/2020) for LROB, in person.   Orders Placed This Encounter  Procedures  . Urine Culture  . GC/Chlamydia Probe Amp  . Genetic Screening  . Pain Management Screening Profile (10S)  . CBC/D/Plt+RPR+Rh+ABO+Rub Ab...  . POC Urinalysis Dipstick OB    03/04/2020 [facility] 02/03/2020 4:07 PM

## 2020-02-04 LAB — CBC/D/PLT+RPR+RH+ABO+RUB AB...
Antibody Screen: NEGATIVE
Basophils Absolute: 0 10*3/uL (ref 0.0–0.2)
Basos: 1 %
EOS (ABSOLUTE): 0.1 10*3/uL (ref 0.0–0.4)
Eos: 2 %
HCV Ab: <0.1 s/co ratio (ref 0.0–0.9)
HIV Screen 4th Generation wRfx: NONREACTIVE
Hematocrit: 42 % (ref 34.0–46.6)
Hemoglobin: 14 g/dL (ref 11.1–15.9)
Hepatitis B Surface Ag: NEGATIVE
Immature Grans (Abs): 0 10*3/uL (ref 0.0–0.1)
Immature Granulocytes: 0 %
Lymphocytes Absolute: 2.4 10*3/uL (ref 0.7–3.1)
Lymphs: 29 %
MCH: 30.8 pg (ref 26.6–33.0)
MCHC: 33.3 g/dL (ref 31.5–35.7)
MCV: 93 fL (ref 79–97)
Monocytes Absolute: 0.8 10*3/uL (ref 0.1–0.9)
Monocytes: 9 %
Neutrophils Absolute: 5.2 10*3/uL (ref 1.4–7.0)
Neutrophils: 59 %
Platelets: 321 10*3/uL (ref 150–450)
RBC: 4.54 x10E6/uL (ref 3.77–5.28)
RDW: 12.7 % (ref 11.7–15.4)
RPR Ser Ql: NONREACTIVE
Rh Factor: POSITIVE
Rubella Antibodies, IGG: 2.31 index (ref >0.99)
WBC: 8.6 10*3/uL (ref 3.4–10.8)

## 2020-02-04 LAB — HCV INTERPRETATION

## 2020-02-06 LAB — PMP SCREEN PROFILE (10S), URINE
Amphetamine Scrn, Ur: NEGATIVE ng/mL
BARBITURATE SCREEN URINE: NEGATIVE ng/mL
BENZODIAZEPINE SCREEN, URINE: NEGATIVE ng/mL
CANNABINOIDS UR QL SCN: NEGATIVE ng/mL
Cocaine (Metab) Scrn, Ur: NEGATIVE ng/mL
Creatinine(Crt), U: 168.1 mg/dL (ref 20.0–300.0)
Methadone Screen, Urine: NEGATIVE ng/mL
OXYCODONE+OXYMORPHONE UR QL SCN: NEGATIVE ng/mL
Opiate Scrn, Ur: NEGATIVE ng/mL
Ph of Urine: 6.2 (ref 4.5–8.9)
Phencyclidine Qn, Ur: NEGATIVE ng/mL
Propoxyphene Scrn, Ur: NEGATIVE ng/mL

## 2020-02-06 LAB — URINE CULTURE

## 2020-02-07 LAB — GC/CHLAMYDIA PROBE AMP
Chlamydia trachomatis, NAA: NEGATIVE
Neisseria Gonorrhoeae by PCR: NEGATIVE

## 2020-02-10 ENCOUNTER — Telehealth: Payer: Self-pay | Admitting: *Deleted

## 2020-02-10 ENCOUNTER — Telehealth: Payer: Self-pay | Admitting: Obstetrics & Gynecology

## 2020-02-10 NOTE — Telephone Encounter (Signed)
Website is not recognizing her "kit #"  Wants to know the gender  Please advise & call pt

## 2020-02-10 NOTE — Telephone Encounter (Signed)
Patient informed test is still pending.  Kit code verified.  Advised to try again in a couple of days as it could not be working because it has not resulted. Pt verbalized understanding. 

## 2020-03-02 ENCOUNTER — Other Ambulatory Visit: Payer: Self-pay

## 2020-03-02 ENCOUNTER — Encounter: Payer: Self-pay | Admitting: Advanced Practice Midwife

## 2020-03-02 ENCOUNTER — Ambulatory Visit (INDEPENDENT_AMBULATORY_CARE_PROVIDER_SITE_OTHER): Payer: BC Managed Care – PPO | Admitting: Advanced Practice Midwife

## 2020-03-02 VITALS — BP 116/79 | HR 107 | Wt 186.0 lb

## 2020-03-02 DIAGNOSIS — Z3480 Encounter for supervision of other normal pregnancy, unspecified trimester: Secondary | ICD-10-CM

## 2020-03-02 DIAGNOSIS — Z3A16 16 weeks gestation of pregnancy: Secondary | ICD-10-CM

## 2020-03-02 DIAGNOSIS — Z331 Pregnant state, incidental: Secondary | ICD-10-CM

## 2020-03-02 DIAGNOSIS — Z1389 Encounter for screening for other disorder: Secondary | ICD-10-CM

## 2020-03-02 LAB — POCT URINALYSIS DIPSTICK OB
Glucose, UA: NEGATIVE
Nitrite, UA: NEGATIVE
POC,PROTEIN,UA: NEGATIVE

## 2020-03-02 NOTE — Patient Instructions (Signed)
AREA PEDIATRIC/FAMILY PRACTICE PHYSICIANS  [place] ([address]) .  [facility] o Chambliss, MD; Eniola, MD; Hale, MD; Hensel, MD; McDiarmid, MD; McIntyer, MD; Neal, MD; Walden, MD o [address] [place], [place] [address] o [phone] o Mon-Fri 8:30-12:30, 1:30-5:00 o Providers come to see babies at [facility] o Accepting Medicaid . [facility] o Limited providers who accept newborns: Koirala, MD; Morrow, MD; Wolters, MD o [address], [place], [place] [address] o [phone] o Mon-Fri 8:00-5:30 o Babies seen by providers at [facility] o Does NOT accept Medicaid o Please call early in hospitalization for appointment (limited availability)  . [facility] o Mulberry, MD o [address] [place], [place] [address] o [phone] o Mon, Tue, Thur, Fri 8:30-5:00, Wed 10:00-7:00 (closed 1-2pm) o Babies seen by [facility] providers o Accepting Medicaid . [facility] - Pediatrician o Rubin, MD o [address], [place], [place] [address] o [phone] o Mon-Fri 8:30-5:00, Sat 8:30-12:00 o Provider comes to see babies at [facility] o Accepting Medicaid o Must have been referred from current patients or contacted office prior to delivery . [facility] ([facility]) o Brown, MD; Chandler, MD; Ettefagh, MD; Grant, MD; Lester, MD; McCormick, MD; McQueen, MD; Prose, MD; Simha, MD; Stanley, MD; Stryffeler, NP; Tebben, NP o [address], [place], [place] [address] o [phone] o Mon, Tue, Thur, Fri 8:30-5:30, Wed 9:30-5:30, Sat 8:30-12:30 o Babies seen by [facility] providers o Accepting Medicaid o Only accepting infants of first-time parents or siblings of current patients o Hospital discharge coordinator will make follow-up appointment . Jack Amos o [address],  [place], [place]  [address] o [phone]   Fax - [phone] . [facility] o [address], [place], [place]  [address] o Phone - [phone]   Fax - [phone] . Shilpa Gosrani o [address], [place], [place]  [address] o [phone]  [place] ([address]) . [facility] o Bates, MD; [place], MD; Cooper, Cox, MD; MD; Davis, MD; Dovico, MD; Ettefaugh, MD; Little, MD; Lowe, MD; Keiffer, MD; Melvin, MD; Sumner, MD; Williams, MD o [address], [place], [place] [address] o [phone] o Mon-Fri 8:30-5:00 (extended evenings Mon-Thur as needed), Sat-Sun 10:00-1:00 o Providers come to see babies at [facility] o Accepting Medicaid for families of first-time babies and families with all children in the household age 3 and under. Must register with office prior to making appointment (M-F only). . [facility] o Henson, NP; Knapp, MD; Lalonde, MD; Tysinger, PA o [address] [place], [place] [address] o [phone] o Mon-Fri 8:00-5:00 o Babies seen by providers at [facility] o Does NOT accept Medicaid/Commercial Insurance Only . [facility] ([facility])  o Artis, MD; Barnes, MD; Bratton, MD; Coccaro, MD; Lockett Gardner, MD; Kramer, MD; Marshall, MD; Netherton, MD; Poleto, MD; Skinner, MD o [address] [place], [place] [address] o [phone] o Mon-Fri 8:30-5:30, Sat (Oct.-Mar.) 9:00-1:00 o Babies seen by providers at [facility] o Accepting Medicaid  [place] ([address]) . [facility] o Reid, MD; Warner, MD o [address], ,  [address] o [phone] o Mon-Fri 8:30-5:00, Sat 8:30-12:00 o Providers come to see babies at [facility] o Does NOT accept Medicaid . [facility] o Becker, PA; Hagler, MD; Scifres, PA; Sun, MD; Swayne, MD o [address],  [place], [place] [address] o [phone] o Mon-Fri 8:00-5:00 o Babies seen by providers at [facility] o Does NOT accept Medicaid o Only accepting babies of parents who are patients o Please call early in hospitalization for appointment (limited availability) . [facility] o Clark, MD; Frye, MD; Kelleher, MD; Mack, NP; Miller, MD; O'Keller, MD; Patterson, NP; Pudlo, MD; Puzio, MD; Thomas, MD; Tucker, MD; Twiselton, MD o [address], [place], [place] [address] o [phone] o Mon-Fri 8:00-5:00, Sat 9:00-12:00 o Providers come to see babies at [facility] o Does NOT accept Medicaid  [place] ([address]) . [facility] o Limited providers accepting new patients: Brake, NP; Wharton, PA o [address], [place], [place] [address] o [phone] o Mon-Fri 8:00-5:00 o Babies seen by providers at [facility] o Does NOT accept Medicaid o Only accepting babies of parents who are patients o Please call early in hospitalization for appointment (limited availability) . [facility] o Gay, MD; Quinlan, MD o [address] [place], [place] [address] o [phone] (press 1 to schedule appointment) o Mon-Fri 8:00-5:00 o Providers come to see babies at [facility] o Does NOT accept Medicaid . [facility] o Mazer, MD o [address] [place], [place] [address] o [phone] o Mon-Fri 8:30-5:00 (lunch 12:30-1:00), extended hours by appointment only Wed 5:00-6:30 o Babies seen by [facility] providers o Accepting Medicaid . [facility] o Banks, MD; Jordan, MD; Koberlein, MD o [address], [place], [place] [address] o [phone] o Mon-Fri 8:00-5:00 o Babies seen by [facility] providers o Does NOT accept Medicaid . [facility] o Parker, MD; Hunter, MD; [place], DO o [address] [place], [place]  [address] o [phone] o Mon-Fri 8:00-5:00 o Babies seen by [facility] providers o Does NOT accept Medicaid . [facility] o Brandon, PA; Brecken, PA; Christy, NP; Dees, MD; DeClaire, MD; DeWeese, MD; Hansen, NP; Mills, NP; Parrish, NP; Smoot, NP; Summer, MD; Vapne, MD o [address] [place], [place] [address] o [phone] o Mon-Fri 8:30-5:00, Sat 10:00-1:00 o Providers come to see babies at [facility] o Does NOT accept Medicaid o Free prenatal information session Tuesdays at 4:45pm . [facility] o Bouska, MD; Gordon, PA; Jeffery, PA; Weber, PA o [address] [place] [address] o [phone] o Mon-Fri 7:30-5:30 o Babies seen by [facility] providers . [facility] o [address], [place], [place]  [address] o [phone]   Fax - [phone]  [place] ([address] & [address]) . [facility] o Reese, MD o [address] [place], [place] [address] o [phone] o Mon-Thur 8:00-6:00 o Providers come to see babies at [facility] o Accepting Medicaid . [facility] o Anderson, NP; Badger, MD; Beal, PA; Spencer, PA o [address] [place], [place] [address] o [phone] o Mon-Thur 7:30-7:30, Fri 7:30-4:30 o Babies seen by [facility] providers o Accepting Medicaid . [facility] o Agbuya, MD; Klett, NP; Romgoolam, MD o [address], [place],  [address] o [phone] o Mon-Fri 8:30-5:00, Sat 8:30-12:00 o Providers come to see babies at [facility] o Accepting Medicaid o Must have "Meet & Greet" appointment at office prior to delivery . [facility] ([facility] o McCord,   MD; [place], MD; Wood, MD o [address], [place], [place] [address] o [phone] o Mon-Wed 8:00-6:00, Thur-Fri 8:00-5:00, Sat 9:00-12:00 o Providers come to  see babies at [facility] o Does NOT accept Medicaid o Only accepting siblings of current patients . [facility]  o [address], [place], [place]  [address] o [phone]   Fax - [phone] . [facility] o [address], [place], [place]  [address] o [phone]   Fax - [phone]  [place] ([address] & [address]) . [facility] o Cirigliano, DO; Matthews, DO o [address] [place], [place] [address] o [phone] o Mon-Fri 7:00-5:00 o Babies seen by [facility] providers o Does NOT accept Medicaid . [facility] o Briscoe, MD; Howley, PA; Moreira, PA o [address], [place], [place] [address] o [phone] o Mon-Fri 8:00-5:00 o Babies seen by [facility] providers o Accepting Medicaid . [facility] o Boyd, MD; Church, PA; Jones, NP; Osborn, PA o [address], [place], [place] [address] o [phone] o Mon-Fri 8:00-5:00 o Babies seen by providers at [facility] o Accepting Medicaid  [place] ([address]) . [facility] [place] o Wendling, DO o [address] [place], [place] [address] o [phone] o Mon-Fri 8:00-5:00 o Babies seen by [facility] providers o Does NOT accept Medicaid o Limited availability, please call early in hospitalization to schedule follow-up . [facility] o Calderon, PA; Cummings, MD; Dillard, MD; Martin, PA; Olson, MD; VanDeven, PA o [address], [place], [place] [address] o [phone] o Mon-Fri 8:30-5:00, Sat 9:00-12:00 o Babies seen by providers at [facility] o Accepting Medicaid o Please register online then schedule online or call office o [email or web address] . [facility] ([facility] o Hunter, NP; Kumar, MD; Martin Rogers, PA o [address], [place], [place] [address] o [phone] o Mon-Fri 8:00-5:00 o Babies seen by providers at [facility] o Accepting Medicaid . [facility] ([facility] o Mahtomedi, MD; Kristi Fleenor, NP; West, MD o [address], [place], [place] [address] o [phone] o Mon-Fri 8:00-5:30, Sat&Sun by appointment (phones open at 8:30) o Babies seen by [facility] providers o Accepting Medicaid o Must be a first-time baby or sibling of current patient . [facility] [place]  o [address], [place], [place]  [address] o [phone]   Fax - [phone]  [place] ([address]) . [facility] o Brown, PA; Cowen, PA; Rice, MD; Helton, PA; Spry, MD o [address] [place], [place] [address] o [phone] o Mon-Thur 8:00-7:00, Fri 8:00-5:00, Sat 8:00-12:00, Sun 9:00-12:00 o Babies seen by [facility] providers o Accepting Medicaid . [facility] o Coe-Goins, MD; Marshall, MD; Pierre-Louis, MD o [address] [place] [address], [place], [place] [address] o [phone] o Mon-Thur 8:00-5:00 o Babies seen by providers at [facility] o Accepting Medicaid . [facility] o Bratton, MD; Coe-Goins, MD; Hayes, MD; Lewis, MD; List, MD; Lott, MD; Marshall, MD; Moran, MD; O'Neal, MD; Pierre-Louis, MD; Pitonzo, MD; Scholer, MD; Spangle, MD o [address] [place], [place]   [address] o [phone] o Mon-Fri 8:00-5:30, Sat (Oct.-Mar.) 9:00-1:00 o Babies seen by providers at [facility] o Accepting Medicaid o Must fill out new patient packet, available online at [email or web address] . [facility] ([facility]) o Friddle, NP; Harris, NP; Kelly, NP; Logan, MD;  Melvin, PA; Poth, MD; Ramadoss, MD; Stanton, NP o [address], [place], [place] [address] o [phone] o Mon-Thur 8:00-5:30, Fri 8:00-5:00 o Babies seen by providers at [facility] o Accepting Medicaid  [place] ([address]) . [facility] o [place], PA; La Salle, MD; Pickard, MD; Tapia, PA o [address], [place], [place] [address] o [phone] o Mon-Fri 8:00-5:00 o Babies seen by providers at [facility] o Accepting Medicaid   [place] ([address]) . [facility] [place] o Masneri, DO; Meyers, MD; Nelson, PA o [address], [place], [place] [address] o [phone] o Mon-Fri 8:00-5:00 o Babies seen by providers at [facility] o Does NOT accept Medicaid o Limited appointment availability, please call early in hospitalization  . [facility] [place] o Kunedd, DO; McGowen, MD o [address], [place], [place] [address] o [phone] o Mon-Fri 8:00-5:00 o Babies seen by [facility] providers o Does NOT accept Medicaid . [facility] [place] o Cameron, MD; MacDonald, MD; Michaels, PA; Nayak, MD o [address] [place] [address], [place], [place] [address] o [phone] o Mon-Fri 8:00-5:00 o After hours clinic ([address] [place], [place] [address]) [phone] Mon-Fri 5:00-8:00, Sat 12:00-6:00, Sun 10:00-4:00 o Babies seen by [facility] providers o Accepting Medicaid . [facility] [place] o [address], [place], [place]  [address] o [phone]   Fax - [phone]  [place] ([address]) . [facility] [place] [facility] o Andy, MD o [address], [place], [place] [address] o [phone] o Mon-Fri 8:00-5:00 o Babies seen by [facility] providers o Does NOT accept Medicaid . [facility] [place] ([facility] [place]) o Eksir, MD o [address], [place], [place]  [address] o [phone] o Mon-Thur 8:00-7:00, Fri 8:00-5:00, Sat 8:00-12:00 o Babies seen by providers at [facility] o Accepting Medicaid - but does not have vaccinations in office (must be received elsewhere) o Limited availability, please call early in hospitalization  [place] ([address]) . [facility]  o Charlene Flemming, MD o [address], [place]  [address] o [phone]  Fax [phone]   

## 2020-03-02 NOTE — Progress Notes (Signed)
   PRENATAL VISIT NOTE  Subjective:  Ariel Hale is a 29 y.o. G2P1001 at [redacted]w[redacted]d being seen today for ongoing prenatal care.  She is currently monitored for the following issues for this low-risk pregnancy and has Major depression, recurrent (HCC); Migraine without aura; CIN I (cervical intraepithelial neoplasia I); POTS (postural orthostatic tachycardia syndrome); Palpitations; History of intrauterine growth restriction in prior pregnancy, currently pregnant; and Encounter for supervision of normal pregnancy, antepartum on their problem list.  Patient reports no complaints.  Contractions: Not present. Vag. Bleeding: None.  Movement: Present. Denies leaking of fluid.   The following portions of the patient's history were reviewed and updated as appropriate: allergies, current medications, past family history, past medical history, past social history, past surgical history and problem list.   Objective:   Vitals:   03/02/20 1508  BP: 116/79  Pulse: (!) 107  Weight: 186 lb (84.4 kg)    Fetal Status: Fetal Heart Rate (bpm): 150   Movement: Present     General:  Alert, oriented and cooperative. Patient is in no acute distress.  Skin: Skin is warm and dry. No rash noted.   Cardiovascular: Normal heart rate noted  Respiratory: Normal respiratory effort, no problems with respiration noted  Abdomen: Soft, gravid, appropriate for gestational age.  Pain/Pressure: Absent     Pelvic: Cervical exam deferred        Extremities: Normal range of motion.  Edema: Trace  Mental Status: Normal mood and affect. Normal behavior. Normal judgment and thought content.   Assessment and Plan:  Pregnancy: G2P1001 at [redacted]w[redacted]d 1. Supervision of other normal pregnancy, antepartum - routine care - Anatomy scan at next visit  - Patient having some tearful moments and some anxiety. She does have a history of depression and has seen a counselor in the past. Encouraged patient to re-establish care with  counselor. Also advised that we have [facility] that can do virtual visits if she has any issues getting in with prior counselor.   2. Screening for genitourinary condition - POC Urinalysis Dipstick OB  3. Pregnant state, incidental   4. [redacted] weeks gestation of pregnancy   Preterm labor symptoms and general obstetric precautions including but not limited to vaginal bleeding, contractions, leaking of fluid and fetal movement were reviewed in detail with the patient. Please refer to After Visit Summary for other counseling recommendations.   Return in about 4 weeks (around 03/30/2020).  No future appointments.

## 2020-03-16 ENCOUNTER — Encounter: Payer: Self-pay | Admitting: *Deleted

## 2020-03-25 ENCOUNTER — Other Ambulatory Visit: Payer: Self-pay | Admitting: Advanced Practice Midwife

## 2020-03-25 DIAGNOSIS — Z363 Encounter for antenatal screening for malformations: Secondary | ICD-10-CM

## 2020-04-04 ENCOUNTER — Other Ambulatory Visit: Payer: Self-pay

## 2020-04-04 ENCOUNTER — Other Ambulatory Visit: Payer: BC Managed Care – PPO

## 2020-04-04 ENCOUNTER — Ambulatory Visit (INDEPENDENT_AMBULATORY_CARE_PROVIDER_SITE_OTHER): Payer: BC Managed Care – PPO | Admitting: Advanced Practice Midwife

## 2020-04-04 ENCOUNTER — Ambulatory Visit (INDEPENDENT_AMBULATORY_CARE_PROVIDER_SITE_OTHER): Payer: BC Managed Care – PPO

## 2020-04-04 VITALS — BP 127/75 | HR 75 | Wt 188.0 lb

## 2020-04-04 DIAGNOSIS — Z1389 Encounter for screening for other disorder: Secondary | ICD-10-CM

## 2020-04-04 DIAGNOSIS — Z3480 Encounter for supervision of other normal pregnancy, unspecified trimester: Secondary | ICD-10-CM

## 2020-04-04 DIAGNOSIS — Z363 Encounter for antenatal screening for malformations: Secondary | ICD-10-CM

## 2020-04-04 DIAGNOSIS — Z3A21 21 weeks gestation of pregnancy: Secondary | ICD-10-CM

## 2020-04-04 DIAGNOSIS — O09299 Supervision of pregnancy with other poor reproductive or obstetric history, unspecified trimester: Secondary | ICD-10-CM | POA: Diagnosis not present

## 2020-04-04 DIAGNOSIS — Z331 Pregnant state, incidental: Secondary | ICD-10-CM

## 2020-04-04 DIAGNOSIS — Z3482 Encounter for supervision of other normal pregnancy, second trimester: Secondary | ICD-10-CM

## 2020-04-04 LAB — POCT URINALYSIS DIPSTICK OB
Blood, UA: NEGATIVE
Glucose, UA: NEGATIVE
Ketones, UA: NEGATIVE
Leukocytes, UA: NEGATIVE
Nitrite, UA: NEGATIVE
POC,PROTEIN,UA: NEGATIVE

## 2020-04-04 NOTE — Progress Notes (Signed)
   LOW-RISK PREGNANCY VISIT Patient name: Ariel Hale MRN [id number]  Date of birth: [birth year] Chief Complaint:   Routine Prenatal Visit  History of Present Illness:   Ariel Hale is a 29 y.o. G2P1001 female at [redacted]w[redacted]d with an Estimated Date of Delivery: 08/15/20 being seen today for ongoing management of a low-risk pregnancy.  Today she reports no complaints. Contractions: Not present. Vag. Bleeding: None.  Movement: Present. denies leaking of fluid. Review of Systems:   Pertinent items are noted in HPI Denies abnormal vaginal discharge w/ itching/odor/irritation, headaches, visual changes, shortness of breath, chest pain, abdominal pain, severe nausea/vomiting, or problems with urination or bowel movements unless otherwise stated above. Pertinent History Reviewed:  Reviewed past medical,surgical, social, obstetrical and family history.  Reviewed problem list, medications and allergies. Physical Assessment:   Vitals:   04/04/20 1529  BP: 127/75  Pulse: 75  Weight: 188 lb (85.3 kg)  Body mass index is 29.44 kg/m.        Physical Examination:   General appearance: Well appearing, and in no distress  Mental status: Alert, oriented to person, place, and time  Skin: Warm & dry  Cardiovascular: Normal heart rate noted  Respiratory: Normal respiratory effort, no distress  Abdomen: Soft, gravid, nontender  Pelvic: Cervical exam deferred         Extremities: Edema: Trace  Fetal Status:     Movement: Present   [place] 21wks,cephalic,cx 4.5 cm,posterior placenta gr 0,normal ovaries,SVP of fluid 5.1 cm,fhr 146 bpm,EFW 418 g 64%,anatomy complete,no obvious abnormalities   Chaperone: n/a    Results for orders placed or performed in visit on 04/04/20 (from the past 24 hour(s))  POC Urinalysis Dipstick OB   Collection Time: 04/04/20  3:30 PM  Result Value Ref Range   Color, UA     Clarity, UA     Glucose, UA Negative Negative   Bilirubin, UA     Ketones, UA neg    Spec  Grav, UA     Blood, UA neg    pH, UA     POC,PROTEIN,UA Negative Negative, Trace, Small (1+), Moderate (2+), Large (3+), 4+   Urobilinogen, UA     Nitrite, UA neg    Leukocytes, UA Negative Negative   Appearance     Odor      Assessment & Plan:  1) Low-risk pregnancy G2P1001 at [redacted]w[redacted]d with an Estimated Date of Delivery: 08/15/20   2) hx depression, has had a therapy appt and plans to continue   Meds: No orders of the defined types were placed in this encounter.  Labs/procedures today: anatomy scan  Plan:  Continue routine obstetrical care  Next visit: prefers in person    Reviewed: Preterm labor symptoms and general obstetric precautions including but not limited to vaginal bleeding, contractions, leaking of fluid and fetal movement were reviewed in detail with the patient.  All questions were answered. Has home bp cuff. Check bp weekly, let us know if >140/90.   Follow-up: Return in about 4 weeks (around 05/02/2020) for LROB.  Orders Placed This Encounter  Procedures  . POC Urinalysis Dipstick OB   Jacklyn Shell DNP, CNM 04/04/2020 3:49 PM

## 2020-04-04 NOTE — Patient Instructions (Signed)
[facility], I greatly value your feedback.  If you receive a survey following your visit with [place] today, we appreciate you taking the time to fill it out.  Thanks, Cathie Beams, CNM     [facility] HAS MOVED!!! It is now [facility] at [facility] ([address] [place], [place] [address]) Entrance located off of [address] Free 24/7 valet parking   Go to [email or web address] to register for FREE online childbirth classes    Second Trimester of Pregnancy The second trimester is from week 14 through week 27 (months 4 through 6). The second trimester is often a time when you feel your best. Your body has adjusted to being pregnant, and you begin to feel better physically. Usually, morning sickness has lessened or quit completely, you may have more energy, and you may have an increase in appetite. The second trimester is also a time when the fetus is growing rapidly. At the end of the sixth month, the fetus is about 9 inches long and weighs about 1 pounds. You will likely begin to feel the baby move (quickening) between 16 and 20 weeks of pregnancy. Body changes during your second trimester Your body continues to go through many changes during your second trimester. The changes vary from woman to woman.  Your weight will continue to increase. You will notice your lower abdomen bulging out.  You may begin to get stretch marks on your hips, abdomen, and breasts.  You may develop headaches that can be relieved by medicines. The medicines should be approved by your health care provider.  You may urinate more often because the fetus is pressing on your bladder.  You may develop or continue to have heartburn as a result of your pregnancy.  You may develop constipation because certain hormones are causing the muscles that push waste through your intestines to slow down.  You may develop hemorrhoids or swollen, bulging veins (varicose veins).  You may  have back pain. This is caused by: ? Weight gain. ? Pregnancy hormones that are relaxing the joints in your pelvis. ? A shift in weight and the muscles that support your balance.  Your breasts will continue to grow and they will continue to become tender.  Your gums may bleed and may be sensitive to brushing and flossing.  Dark spots or blotches (chloasma, mask of pregnancy) may develop on your face. This will likely fade after the baby is born.  A dark line from your belly button to the pubic area (linea nigra) may appear. This will likely fade after the baby is born.  You may have changes in your hair. These can include thickening of your hair, rapid growth, and changes in texture. Some women also have hair loss during or after pregnancy, or hair that feels dry or thin. Your hair will most likely return to normal after your baby is born.  What to expect at prenatal visits During a routine prenatal visit:  You will be weighed to make sure you and the fetus are growing normally.  Your blood pressure will be taken.  Your abdomen will be measured to track your baby's growth.  The fetal heartbeat will be listened to.  Any test results from the previous visit will be discussed.  Your health care provider may ask you:  How you are feeling.  If you are feeling the baby move.  If you have had any abnormal symptoms, such as leaking fluid, bleeding, severe headaches, or  abdominal cramping.  If you are using any tobacco products, including cigarettes, chewing tobacco, and electronic cigarettes.  If you have any questions.  Other tests that may be performed during your second trimester include:  Blood tests that check for: ? Low iron levels (anemia). ? High blood sugar that affects pregnant women (gestational diabetes) between 40 and 28 weeks. ? Rh antibodies. This is to check for a protein on red blood cells (Rh factor).  Urine tests to check for infections, diabetes, or protein  in the urine.  An ultrasound to confirm the proper growth and development of the baby.  An amniocentesis to check for possible genetic problems.  Fetal screens for spina bifida and Down syndrome.  HIV (human immunodeficiency virus) testing. Routine prenatal testing includes screening for HIV, unless you choose not to have this test.  Follow these instructions at home: Medicines  Follow your health care provider's instructions regarding medicine use. Specific medicines may be either safe or unsafe to take during pregnancy.  Take a prenatal vitamin that contains at least 600 micrograms (mcg) of folic acid.  If you develop constipation, try taking a stool softener if your health care provider approves. Eating and drinking  Eat a balanced diet that includes fresh fruits and vegetables, whole grains, good sources of protein such as meat, eggs, or tofu, and low-fat dairy. Your health care provider will help you determine the amount of weight gain that is right for you.  Avoid raw meat and uncooked cheese. These carry germs that can cause birth defects in the baby.  If you have low calcium intake from food, talk to your health care provider about whether you should take a daily calcium supplement.  Limit foods that are high in fat and processed sugars, such as fried and sweet foods.  To prevent constipation: ? Drink enough fluid to keep your urine clear or pale yellow. ? Eat foods that are high in fiber, such as fresh fruits and vegetables, whole grains, and beans. Activity  Exercise only as directed by your health care provider. Most women can continue their usual exercise routine during pregnancy. Try to exercise for 30 minutes at least 5 days a week. Stop exercising if you experience uterine contractions.  Avoid heavy lifting, wear low heel shoes, and practice good posture.  A sexual relationship may be continued unless your health care provider directs you otherwise. Relieving pain  and discomfort  Wear a good support bra to prevent discomfort from breast tenderness.  Take warm sitz baths to soothe any pain or discomfort caused by hemorrhoids. Use hemorrhoid cream if your health care provider approves.  Rest with your legs elevated if you have leg cramps or low back pain.  If you develop varicose veins, wear support hose. Elevate your feet for 15 minutes, 3-4 times a day. Limit salt in your diet. Prenatal Care  Write down your questions. Take them to your prenatal visits.  Keep all your prenatal visits as told by your health care provider. This is important. Safety  Wear your seat belt at all times when driving.  Make a list of emergency phone numbers, including numbers for family, friends, the hospital, and police and fire departments. General instructions  Ask your health care provider for a referral to a local prenatal education class. Begin classes no later than the beginning of month 6 of your pregnancy.  Ask for help if you have counseling or nutritional needs during pregnancy. Your health care provider can offer advice or  refer you to specialists for help with various needs.  Do not use hot tubs, steam rooms, or saunas.  Do not douche or use tampons or scented sanitary pads.  Do not cross your legs for long periods of time.  Avoid cat litter boxes and soil used by cats. These carry germs that can cause birth defects in the baby and possibly loss of the fetus by miscarriage or stillbirth.  Avoid all smoking, herbs, alcohol, and unprescribed drugs. Chemicals in these products can affect the formation and growth of the baby.  Do not use any products that contain nicotine or tobacco, such as cigarettes and e-cigarettes. If you need help quitting, ask your health care provider.  Visit your dentist if you have not gone yet during your pregnancy. Use a soft toothbrush to brush your teeth and be gentle when you floss. Contact a health care provider  if:  You have dizziness.  You have mild pelvic cramps, pelvic pressure, or nagging pain in the abdominal area.  You have persistent nausea, vomiting, or diarrhea.  You have a bad smelling vaginal discharge.  You have pain when you urinate. Get help right away if:  You have a fever.  You are leaking fluid from your vagina.  You have spotting or bleeding from your vagina.  You have severe abdominal cramping or pain.  You have rapid weight gain or weight loss.  You have shortness of breath with chest pain.  You notice sudden or extreme swelling of your face, hands, ankles, feet, or legs.  You have not felt your baby move in over an hour.  You have severe headaches that do not go away when you take medicine.  You have vision changes. Summary  The second trimester is from week 14 through week 27 (months 4 through 6). It is also a time when the fetus is growing rapidly.  Your body goes through many changes during pregnancy. The changes vary from woman to woman.  Avoid all smoking, herbs, alcohol, and unprescribed drugs. These chemicals affect the formation and growth your baby.  Do not use any tobacco products, such as cigarettes, chewing tobacco, and e-cigarettes. If you need help quitting, ask your health care provider.  Contact your health care provider if you have any questions. Keep all prenatal visits as told by your health care provider. This is important. This information is not intended to replace advice given to you by your health care provider. Make sure you discuss any questions you have with your health care provider.

## 2020-04-04 NOTE — Progress Notes (Signed)
[place] 21wks,cephalic,cx 4.5 cm,posterior placenta gr 0,normal ovaries,SVP of fluid 5.1 cm,fhr 146 bpm,EFW 418 g 64%,anatomy complete,no obvious abnormalities

## 2020-04-05 DIAGNOSIS — Z029 Encounter for administrative examinations, unspecified: Secondary | ICD-10-CM

## 2020-04-06 ENCOUNTER — Encounter: Payer: Self-pay | Admitting: *Deleted

## 2020-04-06 DIAGNOSIS — Z3480 Encounter for supervision of other normal pregnancy, unspecified trimester: Secondary | ICD-10-CM

## 2020-04-09 NOTE — L&D Delivery Note (Signed)
Delivery Note At 1126 a viable female infant was delivered via SVD, presentation: OA. APGAR: 9, 9; weight pending.   Placenta status: spontaneously delivered intact with gentle cord traction. Fundus firm with massage and Pitocin.   Anesthesia: epidural Lacerations: none Est. Blood Loss (mL): 50 Placenta to LD Complications none Cord ph n/a   Mom to postpartum. Baby to Couplet care / Skin to Skin.    Donette Larry, CNM 08/07/2020 11:50 AM

## 2020-04-20 ENCOUNTER — Encounter: Payer: Self-pay | Admitting: *Deleted

## 2020-04-21 ENCOUNTER — Other Ambulatory Visit: Payer: BC Managed Care – PPO

## 2020-04-21 DIAGNOSIS — Z20822 Contact with and (suspected) exposure to covid-19: Secondary | ICD-10-CM

## 2020-04-24 LAB — NOVEL CORONAVIRUS, NAA: SARS-CoV-2, NAA: NOT DETECTED

## 2020-05-03 ENCOUNTER — Encounter: Payer: Self-pay | Admitting: *Deleted

## 2020-05-04 ENCOUNTER — Ambulatory Visit (INDEPENDENT_AMBULATORY_CARE_PROVIDER_SITE_OTHER): Payer: BC Managed Care – PPO | Admitting: Obstetrics and Gynecology

## 2020-05-04 ENCOUNTER — Other Ambulatory Visit: Payer: Self-pay

## 2020-05-04 ENCOUNTER — Encounter: Payer: Self-pay | Admitting: Obstetrics and Gynecology

## 2020-05-04 VITALS — BP 121/80 | HR 94 | Wt 195.0 lb

## 2020-05-04 DIAGNOSIS — Z3480 Encounter for supervision of other normal pregnancy, unspecified trimester: Secondary | ICD-10-CM

## 2020-05-04 DIAGNOSIS — R55 Syncope and collapse: Secondary | ICD-10-CM

## 2020-05-04 DIAGNOSIS — Z3A25 25 weeks gestation of pregnancy: Secondary | ICD-10-CM

## 2020-05-04 NOTE — Progress Notes (Signed)
Pt reports fainting several times.

## 2020-05-04 NOTE — Progress Notes (Signed)
   LOW-RISK PREGNANCY OFFICE VISIT Patient name: Ariel Hale MRN [id number]  Date of birth: May 06, 1990 Chief Complaint:   Routine Prenatal Visit  History of Present Illness:   Ariel Hale is a 30 y.o. G9P1001 female at [redacted]w[redacted]d with an Estimated Date of Delivery: 08/15/20 being seen today for ongoing management of a low-risk pregnancy.  Today she reports frequent fainting episodes. 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 Contractions: Not present. Vag. Bleeding: None.  Movement: Present. denies leaking of fluid. Review of Systems:   Pertinent items are noted in HPI Denies abnormal vaginal discharge w/ itching/odor/irritation, headaches, visual changes, shortness of breath, chest pain, abdominal pain, severe nausea/vomiting, or problems with urination or bowel movements unless otherwise stated above. Pertinent History Reviewed:  Reviewed past medical,surgical, social, obstetrical and family history.  Reviewed problem list, medications and allergies. Physical Assessment:   Vitals:   05/04/20 1519  BP: 121/80  Pulse: 94  Weight: 195 lb (88.5 kg)  Body mass index is 30.54 kg/m.        Physical Examination:   General appearance: Well appearing, and in no distress  Mental status: Alert, oriented to person, place, and time  Skin: Warm & dry  Cardiovascular: Normal heart rate noted  Respiratory: Normal respiratory effort, no distress  Abdomen: Soft, gravid, nontender  Pelvic: Cervical exam deferred         Extremities: Edema: Trace  Fetal Status: Fetal Heart Rate (bpm): 125 Fundal Height: 27 cm Movement: Present    No results found for this or any previous visit (from the past 24 hour(s)).  Assessment & Plan:  1) Low-risk pregnancy G2P1001 at [redacted]w[redacted]d with an Estimated Date of Delivery: 08/15/20   2) Supervision of other normal pregnancy, antepartum - Anticipatory guidance for 2 hr GTT - advised to fast after midnight without anything to eat or  drink (except for water), will have fasting blood drawn, drink the glucola drink (flavor choices: orange, fruit punch or lemon-lime), have a visit with a provider during the first hour of testing, wait in the lab waiting room to have blood drawn at 1 hour and then 2 hours after finishing glucola drink.  3) Neurogenic syncope - Advised to increase water intake, change positions slowly and increase protein in daily meals - Notify office if symptoms worsen or persist with no change - Explained that fainting episodes may have increased in response to increased blood volume that occurs normally with pregnancy  4) [redacted] weeks gestation of pregnancy    Meds: No orders of the defined types were placed in this encounter.  Labs/procedures today: none  Plan:  Continue routine obstetrical care   Reviewed: Preterm labor symptoms and general obstetric precautions including but not limited to vaginal bleeding, contractions, leaking of fluid and fetal movement were reviewed in detail with the patient.  All questions were answered. Has home bp cuff.   Follow-up: Return in about 3 weeks (around 05/25/2020) for Return OB 2hr GTT.  No orders of the defined types were placed in this encounter.  Raelyn Mora MSN, CNM 05/04/2020 3:56 PM

## 2020-05-04 NOTE — Patient Instructions (Signed)

## 2020-05-26 ENCOUNTER — Encounter: Payer: Self-pay | Admitting: Women's Health

## 2020-05-26 ENCOUNTER — Other Ambulatory Visit: Payer: Self-pay

## 2020-05-26 ENCOUNTER — Ambulatory Visit (INDEPENDENT_AMBULATORY_CARE_PROVIDER_SITE_OTHER): Payer: BC Managed Care – PPO | Admitting: Women's Health

## 2020-05-26 ENCOUNTER — Other Ambulatory Visit: Payer: BC Managed Care – PPO

## 2020-05-26 VITALS — BP 112/70 | HR 92 | Wt 197.0 lb

## 2020-05-26 DIAGNOSIS — Z3480 Encounter for supervision of other normal pregnancy, unspecified trimester: Secondary | ICD-10-CM

## 2020-05-26 DIAGNOSIS — Z3A28 28 weeks gestation of pregnancy: Secondary | ICD-10-CM | POA: Diagnosis not present

## 2020-05-26 DIAGNOSIS — Z3483 Encounter for supervision of other normal pregnancy, third trimester: Secondary | ICD-10-CM

## 2020-05-26 DIAGNOSIS — Z8759 Personal history of other complications of pregnancy, childbirth and the puerperium: Secondary | ICD-10-CM

## 2020-05-26 DIAGNOSIS — Z131 Encounter for screening for diabetes mellitus: Secondary | ICD-10-CM

## 2020-05-26 DIAGNOSIS — F418 Other specified anxiety disorders: Secondary | ICD-10-CM | POA: Insufficient documentation

## 2020-05-26 MED ORDER — SERTRALINE HCL 25 MG PO TABS: 25.0000 mg | ORAL_TABLET | Freq: Every day | ORAL | 6 refills | Status: DC

## 2020-05-26 NOTE — Progress Notes (Signed)
LOW-RISK PREGNANCY VISIT Patient name: Ariel Hale MRN [id number]  Date of birth: 11-01-1990 Chief Complaint:   Routine Prenatal Visit (PN2 today)  History of Present Illness:   Ariel Hale is a 30 y.o. G19P1001 female at [redacted]w[redacted]d with an Estimated Date of Delivery: 08/15/20 being seen today for ongoing management of a low-risk pregnancy.  Depression screen [facility] 2/9 05/26/2020 02/03/2020 12/25/2019 01/06/2019  Decreased Interest 0 0 0 2  Down, Depressed, Hopeless 1 0 0 3  PHQ - 2 Score 1 0 0 5  Altered sleeping 0 0 0 3  Tired, decreased energy 1 2 0 1  Change in appetite 0 0 0 3  Feeling bad or failure about yourself  0 0 0 3  Trouble concentrating 0 0 0 1  Moving slowly or fidgety/restless 0 0 0 1  Suicidal thoughts 0 0 0 3  PHQ-9 Score 2 2 0 20  Difficult doing work/chores Not difficult at all - Not difficult at all Very difficult    Today she reports feels overwhelmed, down a lot. Has h/o dep/anx- was on meds in past, not sure what. Denies SI/HI. Feels she needs to get back on meds. Declines IBH referral right now. Contractions: Not present.  .  Movement: Present. denies leaking of fluid. Review of Systems:   Pertinent items are noted in HPI Denies abnormal vaginal discharge w/ itching/odor/irritation, headaches, visual changes, shortness of breath, chest pain, abdominal pain, severe nausea/vomiting, or problems with urination or bowel movements unless otherwise stated above. Pertinent History Reviewed:  Reviewed past medical,surgical, social, obstetrical and family history.  Reviewed problem list, medications and allergies. Physical Assessment:   Vitals:   05/26/20 0851  BP: 112/70  Pulse: 92  Weight: 197 lb (89.4 kg)  Body mass index is 30.85 kg/m.        Physical Examination:   General appearance: Well appearing, and in no distress  Mental status: Alert, oriented to person, place, and time  Skin: Warm & dry  Cardiovascular: Normal heart rate  noted  Respiratory: Normal respiratory effort, no distress  Abdomen: Soft, gravid, nontender  Pelvic: Cervical exam deferred         Extremities: Edema: None  Fetal Status: Fetal Heart Rate (bpm): 137 Fundal Height: 29 cm Movement: Present    Chaperone: N/A   No results found for this or any previous visit (from the past 24 hour(s)).  Assessment & Plan:  1) Low-risk pregnancy G2P1001 at [redacted]w[redacted]d with an Estimated Date of Delivery: 08/15/20   2) Dep/anx, rx zoloft 25mg , declines IBH referral right now  3) H/O FGR> EFW next visit   Meds:  Meds ordered this encounter  Medications  . sertraline (ZOLOFT) 25 MG tablet    Sig: Take 1 tablet (25 mg total) by mouth daily.    Dispense:  30 tablet    Refill:  6    Order Specific Question:   Supervising Provider    Answer:   H [[id number]]   Labs/procedures today: pn2, declined flu & tdap  Plan:  Continue routine obstetrical care  Next visit: prefers in person    Reviewed: Preterm labor symptoms and general obstetric precautions including but not limited to vaginal bleeding, contractions, leaking of fluid and fetal movement were reviewed in detail with the patient.  All questions were answered. Has home bp cuff. Check bp weekly, let Duane Lope know if >140/90.   Follow-up: Return in about 4 weeks (around 06/23/2020) for LROB, US:EFW, in person, CNM.  No future appointments.  Orders Placed This Encounter  Procedures  . US OB Follow Up   Cheral Marker CNM, [facility] 05/26/2020 9:39 AM

## 2020-05-26 NOTE — Patient Instructions (Signed)
[facility] [place], I greatly value your feedback.  If you receive a survey following your visit with [place] today, we appreciate you taking the time to fill it out.  Thanks, Joellyn Haff, CNM, WHNP-BC   [facility] ([address] [place], [place] [address]) Entrance C, located off of [address] valet parking  Go to [email or web address] to register for FREE online childbirth classes   Call the office [phone]) or go to [facility] if:  You begin to have strong, frequent contractions  Your water breaks.  Sometimes it is a big gush of fluid, sometimes it is just a trickle that keeps getting your panties wet or running down your legs  You have vaginal bleeding.  It is normal to have a small amount of spotting if your cervix was checked.   You don't feel your baby moving like normal.  If you don't, get you something to eat and drink and lay down and focus on feeling your baby move.  You should feel at least 10 movements in 2 hours.  If you don't, you should call the office or go to [facility].    Tdap Vaccine  It is recommended that you get the Tdap vaccine during the third trimester of EACH pregnancy to help protect your baby from getting pertussis (whooping cough)  27-36 weeks is the BEST time to do this so that you can pass the protection on to your baby. During pregnancy is better than after pregnancy, but if you are unable to get it during pregnancy it will be offered at the hospital.   You can get this vaccine with [place], at the health department, your family doctor, or some local pharmacies  Everyone who will be around your baby should also be up-to-date on their vaccines before the baby comes. Adults (who are not pregnant) only need 1 dose of Tdap during adulthood.   [place] Pediatricians/Family Doctors:  [facility] [phone]            [facility] [phone]                 [facility] [phone] (usually not accepting new patients unless you have family there already, you are always welcome to call and ask)       [facility] Department [phone]       [facility] Pediatricians/Family Doctors:   [facility]: [phone]  [facility]: [phone]  [facility]: [phone]  [facility] Doctors:   [facility]: [phone]   [facility]: [phone]  [facility] Doctors:  [facility]: [phone]   Home Blood Pressure Monitoring for Patients   Your provider has recommended that you check your blood pressure (BP) at least once a week at home. If you do not have a blood pressure cuff at home, one will be provided for you. Contact your provider if you have not received your monitor within 1 week.   Helpful Tips for Accurate Home Blood Pressure Checks  . Don't smoke, exercise, or drink caffeine 30 minutes before checking your BP . Use the restroom before checking your BP (a full bladder can raise your pressure) . Relax in a comfortable upright chair . Feet on the ground . Left arm resting comfortably on a flat surface at the level of your heart . Legs uncrossed . Back supported . Sit quietly and don't talk . Place the cuff on your  bare arm . Adjust snuggly, so that only two fingertips can fit between your skin and the top of the cuff . Check 2 readings separated by at least one minute . Keep a log of your BP readings . For a visual, please reference this diagram: http://ccnc.care/bpdiagram  Provider Name: [facility]     Phone: [phone]  Zone 1: ALL CLEAR  Continue to monitor your symptoms:  . BP reading is less than 140 (top number) or less than 90 (bottom number)  . No right upper stomach pain . No headaches or seeing spots . No feeling nauseated or throwing up . No swelling in face and hands  Zone 2: CAUTION Call  your doctor's office for any of the following:  . BP reading is greater than 140 (top number) or greater than 90 (bottom number)  . Stomach pain under your ribs in the middle or right side . Headaches or seeing spots . Feeling nauseated or throwing up . Swelling in face and hands  Zone 3: EMERGENCY  Seek immediate medical care if you have any of the following:  . BP reading is greater than160 (top number) or greater than 110 (bottom number) . Severe headaches not improving with Tylenol . Serious difficulty catching your breath . Any worsening symptoms from Zone 2   Third Trimester of Pregnancy The third trimester is from week 29 through week 42, months 7 through 9. The third trimester is a time when the fetus is growing rapidly. At the end of the ninth month, the fetus is about 20 inches in length and weighs 6-10 pounds.  BODY CHANGES Your body goes through many changes during pregnancy. The changes vary from woman to woman.   Your weight will continue to increase. You can expect to gain 25-35 pounds (11-16 kg) by the end of the pregnancy.  You may begin to get stretch marks on your hips, abdomen, and breasts.  You may urinate more often because the fetus is moving lower into your pelvis and pressing on your bladder.  You may develop or continue to have heartburn as a result of your pregnancy.  You may develop constipation because certain hormones are causing the muscles that push waste through your intestines to slow down.  You may develop hemorrhoids or swollen, bulging veins (varicose veins).  You may have pelvic pain because of the weight gain and pregnancy hormones relaxing your joints between the bones in your pelvis. Backaches may result from overexertion of the muscles supporting your posture.  You may have changes in your hair. These can include thickening of your hair, rapid growth, and changes in texture. Some women also have hair loss during or after pregnancy, or hair  that feels dry or thin. Your hair will most likely return to normal after your baby is born.  Your breasts will continue to grow and be tender. A yellow discharge may leak from your breasts called colostrum.  Your belly button may stick out.  You may feel short of breath because of your expanding uterus.  You may notice the fetus "dropping," or moving lower in your abdomen.  You may have a bloody mucus discharge. This usually occurs a few days to a week before labor begins.  Your cervix becomes thin and soft (effaced) near your due date. WHAT TO EXPECT AT YOUR PRENATAL EXAMS  You will have prenatal exams every 2 weeks until week 36. Then, you will have weekly prenatal exams. During a routine prenatal visit:  You will be weighed to make sure you and the fetus are growing normally.  Your blood pressure is taken.  Your abdomen will be measured to track your baby's growth.  The fetal heartbeat will be listened to.  Any test results from the previous visit will be discussed.  You may have a cervical check near your due date to see if you have effaced. At around 36 weeks, your caregiver will check your cervix. At the same time, your caregiver will also perform a test on the secretions of the vaginal tissue. This test is to determine if a type of bacteria, Group B streptococcus, is present. Your caregiver will explain this further. Your caregiver may ask you:  What your birth plan is.  How you are feeling.  If you are feeling the baby move.  If you have had any abnormal symptoms, such as leaking fluid, bleeding, severe headaches, or abdominal cramping.  If you have any questions. Other tests or screenings that may be performed during your third trimester include:  Blood tests that check for low iron levels (anemia).  Fetal testing to check the health, activity level, and growth of the fetus. Testing is done if you have certain medical conditions or if there are problems during the  pregnancy. FALSE LABOR You may feel small, irregular contractions that eventually go away. These are called Braxton Hicks contractions, or false labor. Contractions may last for hours, days, or even weeks before true labor sets in. If contractions come at regular intervals, intensify, or become painful, it is best to be seen by your caregiver.  SIGNS OF LABOR   Menstrual-like cramps.  Contractions that are 5 minutes apart or less.  Contractions that start on the top of the uterus and spread down to the lower abdomen and back.  A sense of increased pelvic pressure or back pain.  A watery or bloody mucus discharge that comes from the vagina. If you have any of these signs before the 37th week of pregnancy, call your caregiver right away. You need to go to the hospital to get checked immediately. HOME CARE INSTRUCTIONS   Avoid all smoking, herbs, alcohol, and unprescribed drugs. These chemicals affect the formation and growth of the baby.  Follow your caregiver's instructions regarding medicine use. There are medicines that are either safe or unsafe to take during pregnancy.  Exercise only as directed by your caregiver. Experiencing uterine cramps is a good sign to stop exercising.  Continue to eat regular, healthy meals.  Wear a good support bra for breast tenderness.  Do not use hot tubs, steam rooms, or saunas.  Wear your seat belt at all times when driving.  Avoid raw meat, uncooked cheese, cat litter boxes, and soil used by cats. These carry germs that can cause birth defects in the baby.  Take your prenatal vitamins.  Try taking a stool softener (if your caregiver approves) if you develop constipation. Eat more high-fiber foods, such as fresh vegetables or fruit and whole grains. Drink plenty of fluids to keep your urine clear or pale yellow.  Take warm sitz baths to soothe any pain or discomfort caused by hemorrhoids. Use hemorrhoid cream if your caregiver approves.  If you  develop varicose veins, wear support hose. Elevate your feet for 15 minutes, 3-4 times a day. Limit salt in your diet.  Avoid heavy lifting, wear low heal shoes, and practice good posture.  Rest a lot with your legs elevated if you have leg cramps or low  back pain.  Visit your dentist if you have not gone during your pregnancy. Use a soft toothbrush to brush your teeth and be gentle when you floss.  A sexual relationship may be continued unless your caregiver directs you otherwise.  Do not travel far distances unless it is absolutely necessary and only with the approval of your caregiver.  Take prenatal classes to understand, practice, and ask questions about the labor and delivery.  Make a trial run to the hospital.  Pack your hospital bag.  Prepare the baby's nursery.  Continue to go to all your prenatal visits as directed by your caregiver. SEEK MEDICAL CARE IF:  You are unsure if you are in labor or if your water has broken.  You have dizziness.  You have mild pelvic cramps, pelvic pressure, or nagging pain in your abdominal area.  You have persistent nausea, vomiting, or diarrhea.  You have a bad smelling vaginal discharge.  You have pain with urination. SEEK IMMEDIATE MEDICAL CARE IF:   You have a fever.  You are leaking fluid from your vagina.  You have spotting or bleeding from your vagina.  You have severe abdominal cramping or pain.  You have rapid weight loss or gain.  You have shortness of breath with chest pain.  You notice sudden or extreme swelling of your face, hands, ankles, feet, or legs.  You have not felt your baby move in over an hour.  You have severe headaches that do not go away with medicine.  You have vision changes. Document Released: 03/20/2001 Document Revised: 03/31/2013 Document Reviewed: 05/27/2012 [facility] Patient Information 2015 [place], [place]. This information is not intended to replace advice given to you by your health  care provider. Make sure you discuss any questions you have with your health care provider.

## 2020-05-27 LAB — CBC
Hematocrit: 36.7 % (ref 34.0–46.6)
Hemoglobin: 12.2 g/dL (ref 11.1–15.9)
MCH: 30.9 pg (ref 26.6–33.0)
MCHC: 33.2 g/dL (ref 31.5–35.7)
MCV: 93 fL (ref 79–97)
Platelets: 246 10*3/uL (ref 150–450)
RBC: 3.95 x10E6/uL (ref 3.77–5.28)
RDW: 12.8 % (ref 11.7–15.4)
WBC: 8.2 10*3/uL (ref 3.4–10.8)

## 2020-05-27 LAB — RPR: RPR Ser Ql: NONREACTIVE

## 2020-05-27 LAB — GLUCOSE TOLERANCE, 2 HOURS W/ 1HR
Glucose, 1 hour: 112 mg/dL (ref 65–179)
Glucose, 2 hour: 111 mg/dL (ref 65–152)
Glucose, Fasting: 73 mg/dL (ref 65–91)

## 2020-05-27 LAB — ANTIBODY SCREEN: Antibody Screen: NEGATIVE

## 2020-05-27 LAB — HIV ANTIBODY (ROUTINE TESTING W REFLEX): HIV Screen 4th Generation wRfx: NONREACTIVE

## 2020-06-17 ENCOUNTER — Other Ambulatory Visit: Payer: Self-pay | Admitting: Women's Health

## 2020-06-23 ENCOUNTER — Other Ambulatory Visit: Payer: Self-pay

## 2020-06-23 ENCOUNTER — Encounter: Payer: Self-pay | Admitting: Women's Health

## 2020-06-23 ENCOUNTER — Ambulatory Visit (INDEPENDENT_AMBULATORY_CARE_PROVIDER_SITE_OTHER): Payer: BC Managed Care – PPO

## 2020-06-23 ENCOUNTER — Ambulatory Visit (INDEPENDENT_AMBULATORY_CARE_PROVIDER_SITE_OTHER): Payer: BC Managed Care – PPO | Admitting: Women's Health

## 2020-06-23 VITALS — BP 112/74 | HR 92 | Wt 197.0 lb

## 2020-06-23 DIAGNOSIS — Z3483 Encounter for supervision of other normal pregnancy, third trimester: Secondary | ICD-10-CM

## 2020-06-23 DIAGNOSIS — Z8759 Personal history of other complications of pregnancy, childbirth and the puerperium: Secondary | ICD-10-CM

## 2020-06-23 DIAGNOSIS — Z3480 Encounter for supervision of other normal pregnancy, unspecified trimester: Secondary | ICD-10-CM

## 2020-06-23 DIAGNOSIS — O09299 Supervision of pregnancy with other poor reproductive or obstetric history, unspecified trimester: Secondary | ICD-10-CM

## 2020-06-23 NOTE — Patient Instructions (Addendum)
[facility] [place], I greatly value your feedback.  If you receive a survey following your visit with [place] today, we appreciate you taking the time to fill it out.  Thanks, Joellyn Haff, CNM, WHNP-BC   [facility] at [facility] ([address] [place], [place] [address]) Entrance C, located off of [address] [employer] valet parking   For Dizzy Spells:   This is usually related to either your blood sugar or your blood pressure dropping  Make sure you are staying well hydrated and drinking enough water so that your urine is clear  Eat small frequent meals and snacks containing protein (meat, eggs, nuts, cheese) so that your blood sugar doesn't drop  If you do get dizzy, sit/lay down and get you something to drink and a snack containing protein- you will usually start feeling better in 10-20 minutes    Go to [email or web address] to register for FREE online childbirth classes   Call the office [phone]) or go to [facility] if:  You begin to have strong, frequent contractions  Your water breaks.  Sometimes it is a big gush of fluid, sometimes it is just a trickle that keeps getting your panties wet or running down your legs  You have vaginal bleeding.  It is normal to have a small amount of spotting if your cervix was checked.   You don't feel your baby moving like normal.  If you don't, get you something to eat and drink and lay down and focus on feeling your baby move.  You should feel at least 10 movements in 2 hours.  If you don't, you should call the office or go to [facility].    Tdap Vaccine  It is recommended that you get the Tdap vaccine during the third trimester of EACH pregnancy to help protect your baby from getting pertussis (whooping cough)  27-36 weeks is the BEST time to do this so that you can pass the protection on to your baby. During pregnancy is better than after pregnancy, but if you are unable to get it during pregnancy it will be  offered at the hospital.   You can get this vaccine with [place], at the health department, your family doctor, or some local pharmacies  Everyone who will be around your baby should also be up-to-date on their vaccines before the baby comes. Adults (who are not pregnant) only need 1 dose of Tdap during adulthood.   [place] Pediatricians/Family Doctors:  [facility] [phone]            [facility] [phone]                 [facility] [phone] (usually not accepting new patients unless you have family there already, you are always welcome to call and ask)       [facility] Department [phone]       [facility] Pediatricians/Family Doctors:   [facility]: [phone]  [facility]: [phone]  [facility]: [phone]  [facility] Doctors:   [facility]: [phone]   [facility]: [phone]  [facility] Doctors:  [facility]: [phone]   Home Blood Pressure Monitoring for Patients   Your provider has recommended that you check your blood pressure (BP) at least once a week at home. If you do not have a blood pressure cuff at home, one will be provided for you. Contact your provider if you have not received  your monitor within 1 week.   Helpful Tips for Accurate Home Blood Pressure Checks  . Don't smoke, exercise, or drink caffeine 30 minutes before checking your BP . Use the restroom before checking your BP (a full bladder can raise your pressure) . Relax in a comfortable upright chair . Feet on the ground . Left arm resting comfortably on a flat surface at the level of your heart . Legs uncrossed . Back supported . Sit quietly and don't talk . Place the cuff on your bare arm . Adjust snuggly, so that only two fingertips can fit between your skin and the top of the cuff . Check 2 readings separated by at  least one minute . Keep a log of your BP readings . For a visual, please reference this diagram: http://ccnc.care/bpdiagram  Provider Name: [facility]     Phone: [phone]  Zone 1: ALL CLEAR  Continue to monitor your symptoms:  . BP reading is less than 140 (top number) or less than 90 (bottom number)  . No right upper stomach pain . No headaches or seeing spots . No feeling nauseated or throwing up . No swelling in face and hands  Zone 2: CAUTION Call your doctor's office for any of the following:  . BP reading is greater than 140 (top number) or greater than 90 (bottom number)  . Stomach pain under your ribs in the middle or right side . Headaches or seeing spots . Feeling nauseated or throwing up . Swelling in face and hands  Zone 3: EMERGENCY  Seek immediate medical care if you have any of the following:  . BP reading is greater than160 (top number) or greater than 110 (bottom number) . Severe headaches not improving with Tylenol . Serious difficulty catching your breath . Any worsening symptoms from Zone 2   Third Trimester of Pregnancy The third trimester is from week 29 through week 42, months 7 through 9. The third trimester is a time when the fetus is growing rapidly. At the end of the ninth month, the fetus is about 20 inches in length and weighs 6-10 pounds.  BODY CHANGES Your body goes through many changes during pregnancy. The changes vary from woman to woman.   Your weight will continue to increase. You can expect to gain 25-35 pounds (11-16 kg) by the end of the pregnancy.  You may begin to get stretch marks on your hips, abdomen, and breasts.  You may urinate more often because the fetus is moving lower into your pelvis and pressing on your bladder.  You may develop or continue to have heartburn as a result of your pregnancy.  You may develop constipation because certain hormones are causing the muscles that push waste through your intestines to  slow down.  You may develop hemorrhoids or swollen, bulging veins (varicose veins).  You may have pelvic pain because of the weight gain and pregnancy hormones relaxing your joints between the bones in your pelvis. Backaches may result from overexertion of the muscles supporting your posture.  You may have changes in your hair. These can include thickening of your hair, rapid growth, and changes in texture. Some women also have hair loss during or after pregnancy, or hair that feels dry or thin. Your hair will most likely return to normal after your baby is born.  Your breasts will continue to grow and be tender. A yellow discharge may leak from your breasts called colostrum.  Your belly button may stick out.  You may feel short of breath because of your expanding uterus.  You may notice the fetus "dropping," or moving lower in your abdomen.  You may have a bloody mucus discharge. This usually occurs a few days to a week before labor begins.  Your cervix becomes thin and soft (effaced) near your due date. WHAT TO EXPECT AT YOUR PRENATAL EXAMS  You will have prenatal exams every 2 weeks until week 36. Then, you will have weekly prenatal exams. During a routine prenatal visit:  You will be weighed to make sure you and the fetus are growing normally.  Your blood pressure is taken.  Your abdomen will be measured to track your baby's growth.  The fetal heartbeat will be listened to.  Any test results from the previous visit will be discussed.  You may have a cervical check near your due date to see if you have effaced. At around 36 weeks, your caregiver will check your cervix. At the same time, your caregiver will also perform a test on the secretions of the vaginal tissue. This test is to determine if a type of bacteria, Group B streptococcus, is present. Your caregiver will explain this further. Your caregiver may ask you:  What your birth plan is.  How you are feeling.  If you  are feeling the baby move.  If you have had any abnormal symptoms, such as leaking fluid, bleeding, severe headaches, or abdominal cramping.  If you have any questions. Other tests or screenings that may be performed during your third trimester include:  Blood tests that check for low iron levels (anemia).  Fetal testing to check the health, activity level, and growth of the fetus. Testing is done if you have certain medical conditions or if there are problems during the pregnancy. FALSE LABOR You may feel small, irregular contractions that eventually go away. These are called Braxton Hicks contractions, or false labor. Contractions may last for hours, days, or even weeks before true labor sets in. If contractions come at regular intervals, intensify, or become painful, it is best to be seen by your caregiver.  SIGNS OF LABOR   Menstrual-like cramps.  Contractions that are 5 minutes apart or less.  Contractions that start on the top of the uterus and spread down to the lower abdomen and back.  A sense of increased pelvic pressure or back pain.  A watery or bloody mucus discharge that comes from the vagina. If you have any of these signs before the 37th week of pregnancy, call your caregiver right away. You need to go to the hospital to get checked immediately. HOME CARE INSTRUCTIONS   Avoid all smoking, herbs, alcohol, and unprescribed drugs. These chemicals affect the formation and growth of the baby.  Follow your caregiver's instructions regarding medicine use. There are medicines that are either safe or unsafe to take during pregnancy.  Exercise only as directed by your caregiver. Experiencing uterine cramps is a good sign to stop exercising.  Continue to eat regular, healthy meals.  Wear a good support bra for breast tenderness.  Do not use hot tubs, steam rooms, or saunas.  Wear your seat belt at all times when driving.  Avoid raw meat, uncooked cheese, cat litter boxes,  and soil used by cats. These carry germs that can cause birth defects in the baby.  Take your prenatal vitamins.  Try taking a stool softener (if your caregiver approves) if you develop constipation. Eat more high-fiber foods, such as fresh vegetables or  fruit and whole grains. Drink plenty of fluids to keep your urine clear or pale yellow.  Take warm sitz baths to soothe any pain or discomfort caused by hemorrhoids. Use hemorrhoid cream if your caregiver approves.  If you develop varicose veins, wear support hose. Elevate your feet for 15 minutes, 3-4 times a day. Limit salt in your diet.  Avoid heavy lifting, wear low heal shoes, and practice good posture.  Rest a lot with your legs elevated if you have leg cramps or low back pain.  Visit your dentist if you have not gone during your pregnancy. Use a soft toothbrush to brush your teeth and be gentle when you floss.  A sexual relationship may be continued unless your caregiver directs you otherwise.  Do not travel far distances unless it is absolutely necessary and only with the approval of your caregiver.  Take prenatal classes to understand, practice, and ask questions about the labor and delivery.  Make a trial run to the hospital.  Pack your hospital bag.  Prepare the baby's nursery.  Continue to go to all your prenatal visits as directed by your caregiver. SEEK MEDICAL CARE IF:  You are unsure if you are in labor or if your water has broken.  You have dizziness.  You have mild pelvic cramps, pelvic pressure, or nagging pain in your abdominal area.  You have persistent nausea, vomiting, or diarrhea.  You have a bad smelling vaginal discharge.  You have pain with urination. SEEK IMMEDIATE MEDICAL CARE IF:   You have a fever.  You are leaking fluid from your vagina.  You have spotting or bleeding from your vagina.  You have severe abdominal cramping or pain.  You have rapid weight loss or gain.  You have  shortness of breath with chest pain.  You notice sudden or extreme swelling of your face, hands, ankles, feet, or legs.  You have not felt your baby move in over an hour.  You have severe headaches that do not go away with medicine.  You have vision changes. Document Released: 03/20/2001 Document Revised: 03/31/2013 Document Reviewed: 05/27/2012 [facility] Patient Information 2015 [place], [place]. This information is not intended to replace advice given to you by your health care provider. Make sure you discuss any questions you have with your health care provider.

## 2020-06-23 NOTE — Progress Notes (Signed)
[place] 32+3 wks,cephalic,fhr 159 bpm,posterior placenta gr 2,afi 15 cm,EFW 2272 g 80%

## 2020-06-23 NOTE — Progress Notes (Signed)
   LOW-RISK PREGNANCY VISIT Patient name: Ariel Hale MRN [id number]  Date of birth: 1991/01/18 Chief Complaint:   Routine Prenatal Visit ([place])  History of Present Illness:   Ariel Hale is a 30 y.o. G17P1001 female at [redacted]w[redacted]d with an Estimated Date of Delivery: 08/15/20 being seen today for ongoing management of a low-risk pregnancy.  Depression screen [facility] 2/9 05/26/2020 02/03/2020 12/25/2019 01/06/2019  Decreased Interest 0 0 0 2  Down, Depressed, Hopeless 1 0 0 3  PHQ - 2 Score 1 0 0 5  Altered sleeping 0 0 0 3  Tired, decreased energy 1 2 0 1  Change in appetite 0 0 0 3  Feeling bad or failure about yourself  0 0 0 3  Trouble concentrating 0 0 0 1  Moving slowly or fidgety/restless 0 0 0 1  Suicidal thoughts 0 0 0 3  PHQ-9 Score 2 2 0 20  Difficult doing work/chores Not difficult at all - Not difficult at all Very difficult    Today she reports dizzy spells. Contractions: Not present. Vag. Bleeding: None.  Movement: Present. denies leaking of fluid. Review of Systems:   Pertinent items are noted in HPI Denies abnormal vaginal discharge w/ itching/odor/irritation, headaches, visual changes, shortness of breath, chest pain, abdominal pain, severe nausea/vomiting, or problems with urination or bowel movements unless otherwise stated above. Pertinent History Reviewed:  Reviewed past medical,surgical, social, obstetrical and family history.  Reviewed problem list, medications and allergies. Physical Assessment:   Vitals:   06/23/20 0903  BP: 112/74  Pulse: 92  Weight: 197 lb (89.4 kg)  Body mass index is 30.85 kg/m.        Physical Examination:   General appearance: Well appearing, and in no distress  Mental status: Alert, oriented to person, place, and time  Skin: Warm & dry  Cardiovascular: Normal heart rate noted  Respiratory: Normal respiratory effort, no distress  Abdomen: Soft, gravid, nontender  Pelvic: Cervical exam deferred         Extremities: Edema:  None  Fetal Status: Fetal Heart Rate (bpm): 159 u/s   Movement: Present    [place] 32+3 wks,cephalic,fhr 159 bpm,posterior placenta gr 2,afi 15 cm,EFW 2272 g 80%   Chaperone: N/A   No results found for this or any previous visit (from the past 24 hour(s)).  Assessment & Plan:  1) Low-risk pregnancy G2P1001 at [redacted]w[redacted]d with an Estimated Date of Delivery: 08/15/20   2) Dizzy spells, discussed and gave printed info   Meds: No orders of the defined types were placed in this encounter.  Labs/procedures today: none  Plan:  Continue routine obstetrical care  Next visit: prefers in person    Reviewed: Preterm labor symptoms and general obstetric precautions including but not limited to vaginal bleeding, contractions, leaking of fluid and fetal movement were reviewed in detail with the patient.  All questions were answered. Has home bp cuff. Check bp weekly, let us know if >140/90.   Follow-up: Return in about 2 weeks (around 07/07/2020) for LROB, CNM, in person.  Future Appointments  Date Time Provider Department Center  07/07/2020  4:10 PM Cheral Marker, CNM [facility]    No orders of the defined types were placed in this encounter.  Cheral Marker CNM, [facility] 06/23/2020 9:55 AM

## 2020-07-07 ENCOUNTER — Other Ambulatory Visit: Payer: Self-pay

## 2020-07-07 ENCOUNTER — Encounter: Payer: Self-pay | Admitting: Women's Health

## 2020-07-07 ENCOUNTER — Ambulatory Visit (INDEPENDENT_AMBULATORY_CARE_PROVIDER_SITE_OTHER): Payer: BC Managed Care – PPO | Admitting: Women's Health

## 2020-07-07 VITALS — BP 119/74 | HR 97 | Wt 198.8 lb

## 2020-07-07 DIAGNOSIS — Z8759 Personal history of other complications of pregnancy, childbirth and the puerperium: Secondary | ICD-10-CM

## 2020-07-07 DIAGNOSIS — Z3483 Encounter for supervision of other normal pregnancy, third trimester: Secondary | ICD-10-CM

## 2020-07-07 NOTE — Progress Notes (Signed)
   LOW-RISK PREGNANCY VISIT Patient name: Ariel Hale MRN [id number]  Date of birth: Aug 17, 1990 Chief Complaint:   Routine Prenatal Visit  History of Present Illness:   Ariel Hale is a 30 y.o. G66P1001 female at [redacted]w[redacted]d with an Estimated Date of Delivery: 08/15/20 being seen today for ongoing management of a low-risk pregnancy.  Depression screen [facility] 2/9 05/26/2020 02/03/2020 12/25/2019 01/06/2019  Decreased Interest 0 0 0 2  Down, Depressed, Hopeless 1 0 0 3  PHQ - 2 Score 1 0 0 5  Altered sleeping 0 0 0 3  Tired, decreased energy 1 2 0 1  Change in appetite 0 0 0 3  Feeling bad or failure about yourself  0 0 0 3  Trouble concentrating 0 0 0 1  Moving slowly or fidgety/restless 0 0 0 1  Suicidal thoughts 0 0 0 3  PHQ-9 Score 2 2 0 20  Difficult doing work/chores Not difficult at all - Not difficult at all Very difficult    Today she reports round ligament pain, low back/pelvic pain. Contractions: Irritability. Vag. Bleeding: None.  Movement: Present. denies leaking of fluid. Review of Systems:   Pertinent items are noted in HPI Denies abnormal vaginal discharge w/ itching/odor/irritation, headaches, visual changes, shortness of breath, chest pain, abdominal pain, severe nausea/vomiting, or problems with urination or bowel movements unless otherwise stated above. Pertinent History Reviewed:  Reviewed past medical,surgical, social, obstetrical and family history.  Reviewed problem list, medications and allergies. Physical Assessment:   Vitals:   07/07/20 1629  BP: 119/74  Pulse: 97  Weight: 198 lb 12.8 oz (90.2 kg)  Body mass index is 31.14 kg/m.        Physical Examination:   General appearance: Well appearing, and in no distress  Mental status: Alert, oriented to person, place, and time  Skin: Warm & dry  Cardiovascular: Normal heart rate noted  Respiratory: Normal respiratory effort, no distress  Abdomen: Soft, gravid, nontender  Pelvic: Cervical exam  deferred         Extremities: Edema: Trace  Fetal Status: Fetal Heart Rate (bpm): 160 Fundal Height: 32 cm Movement: Present    Chaperone: N/A   No results found for this or any previous visit (from the past 24 hour(s)).  Assessment & Plan:  1) Low-risk pregnancy G2P1001 at [redacted]w[redacted]d with an Estimated Date of Delivery: 08/15/20   2) H/O FGR, last EFW 80% @ 32wks   Meds: No orders of the defined types were placed in this encounter.  Labs/procedures today: none  Plan:  Continue routine obstetrical care  Next visit: prefers will be in person for gbs    Reviewed: Preterm labor symptoms and general obstetric precautions including but not limited to vaginal bleeding, contractions, leaking of fluid and fetal movement were reviewed in detail with the patient.  All questions were answered. Does have home bp cuff. Office bp cuff given: not applicable. Check bp weekly, let us know if consistently >140 and/or >90.  Follow-up: Return in about 2 weeks (around 07/21/2020) for LROB, CNM, in person, US:EFW.  Future Appointments  Date Time Provider Department Center  07/21/2020  2:00 PM [facility] [place] [facility] None    Orders Placed This Encounter  Procedures  . US OB Follow Up   Cheral Marker CNM, [facility] 07/07/2020 4:55 PM

## 2020-07-07 NOTE — Patient Instructions (Signed)
[facility], I greatly value your feedback.  If you receive a survey following your visit with [place] today, we appreciate you taking the time to fill it out.  Thanks, Joellyn Haff, CNM, WHNP-BC  [facility] ([address] [place], [place] [address]) Entrance C, located off of [address] valet parking   Go to [email or web address] to register for FREE online childbirth classes    Call the office [phone]) or go to [facility] if:  You begin to have strong, frequent contractions  Your water breaks.  Sometimes it is a big gush of fluid, sometimes it is just a trickle that keeps getting your panties wet or running down your legs  You have vaginal bleeding.  It is normal to have a small amount of spotting if your cervix was checked.   You don't feel your baby moving like normal.  If you don't, get you something to eat and drink and lay down and focus on feeling your baby move.  You should feel at least 10 movements in 2 hours.  If you don't, you should call the office or go to [facility].   Call the office [phone]) or go to [facility] hospital for these signs of pre-eclampsia:  Severe headache that does not go away with Tylenol  Visual changes- seeing spots, double, blurred vision  Pain under your right breast or upper abdomen that does not go away with Tums or heartburn medicine  Nausea and/or vomiting  Severe swelling in your hands, feet, and face    Home Blood Pressure Monitoring for Patients   Your provider has recommended that you check your blood pressure (BP) at least once a week at home. If you do not have a blood pressure cuff at home, one will be provided for you. Contact your provider if you have not received your monitor within 1 week.   Helpful Tips for Accurate Home Blood Pressure Checks  . Don't smoke, exercise, or drink caffeine 30 minutes before checking your BP . Use the restroom before checking your BP (a full  bladder can raise your pressure) . Relax in a comfortable upright chair . Feet on the ground . Left arm resting comfortably on a flat surface at the level of your heart . Legs uncrossed . Back supported . Sit quietly and don't talk . Place the cuff on your bare arm . Adjust snuggly, so that only two fingertips can fit between your skin and the top of the cuff . Check 2 readings separated by at least one minute . Keep a log of your BP readings . For a visual, please reference this diagram: http://ccnc.care/bpdiagram  Provider Name: [facility]     Phone: [phone]  Zone 1: ALL CLEAR  Continue to monitor your symptoms:  . BP reading is less than 140 (top number) or less than 90 (bottom number)  . No right upper stomach pain . No headaches or seeing spots . No feeling nauseated or throwing up . No swelling in face and hands  Zone 2: CAUTION Call your doctor's office for any of the following:  . BP reading is greater than 140 (top number) or greater than 90 (bottom number)  . Stomach pain under your ribs in the middle or right side . Headaches or seeing spots . Feeling nauseated or throwing up . Swelling in face and hands  Zone 3: EMERGENCY  Seek immediate medical care if you have any of  the following:  . BP reading is greater than160 (top number) or greater than 110 (bottom number) . Severe headaches not improving with Tylenol . Serious difficulty catching your breath . Any worsening symptoms from Zone 2  Preterm Labor and Birth Information  The normal length of a pregnancy is 39-41 weeks. Preterm labor is when labor starts before 37 completed weeks of pregnancy. What are the risk factors for preterm labor? Preterm labor is more likely to occur in women who:  Have certain infections during pregnancy such as a bladder infection, sexually transmitted infection, or infection inside the uterus (chorioamnionitis).  Have a shorter-than-normal cervix.  Have gone into  preterm labor before.  Have had surgery on their cervix.  Are younger than age 54 or older than age 25.  Are African American.  Are pregnant with twins or multiple babies (multiple gestation).  Take street drugs or smoke while pregnant.  Do not gain enough weight while pregnant.  Became pregnant shortly after having been pregnant. What are the symptoms of preterm labor? Symptoms of preterm labor include:  Cramps similar to those that can happen during a menstrual period. The cramps may happen with diarrhea.  Pain in the abdomen or lower back.  Regular uterine contractions that may feel like tightening of the abdomen.  A feeling of increased pressure in the pelvis.  Increased watery or bloody mucus discharge from the vagina.  Water breaking (ruptured amniotic sac). Why is it important to recognize signs of preterm labor? It is important to recognize signs of preterm labor because babies who are born prematurely may not be fully developed. This can put them at an increased risk for:  Long-term (chronic) heart and lung problems.  Difficulty immediately after birth with regulating body systems, including blood sugar, body temperature, heart rate, and breathing rate.  Bleeding in the brain.  Cerebral palsy.  Learning difficulties.  Death. These risks are highest for babies who are born before 48 weeks of pregnancy. How is preterm labor treated? Treatment depends on the length of your pregnancy, your condition, and the health of your baby. It may involve: 1. Having a stitch (suture) placed in your cervix to prevent your cervix from opening too early (cerclage). 2. Taking or being given medicines, such as: ? Hormone medicines. These may be given early in pregnancy to help support the pregnancy. ? Medicine to stop contractions. ? Medicines to help mature the baby's lungs. These may be prescribed if the risk of delivery is high. ? Medicines to prevent your baby from  developing cerebral palsy. If the labor happens before 34 weeks of pregnancy, you may need to stay in the hospital. What should I do if I think I am in preterm labor? If you think that you are going into preterm labor, call your health care provider right away. How can I prevent preterm labor in future pregnancies? To increase your chance of having a full-term pregnancy:  Do not use any tobacco products, such as cigarettes, chewing tobacco, and e-cigarettes. If you need help quitting, ask your health care provider.  Do not use street drugs or medicines that have not been prescribed to you during your pregnancy.  Talk with your health care provider before taking any herbal supplements, even if you have been taking them regularly.  Make sure you gain a healthy amount of weight during your pregnancy.  Watch for infection. If you think that you might have an infection, get it checked right away.  Make sure to  tell your health care provider if you have gone into preterm labor before. This information is not intended to replace advice given to you by your health care provider. Make sure you discuss any questions you have with your health care provider. Document Revised: 07/18/2018 Document Reviewed: 08/17/2015 Elsevier Patient Education  [place].

## 2020-07-21 ENCOUNTER — Ambulatory Visit (INDEPENDENT_AMBULATORY_CARE_PROVIDER_SITE_OTHER): Payer: BC Managed Care – PPO

## 2020-07-21 ENCOUNTER — Ambulatory Visit (INDEPENDENT_AMBULATORY_CARE_PROVIDER_SITE_OTHER): Payer: BC Managed Care – PPO | Admitting: Advanced Practice Midwife

## 2020-07-21 ENCOUNTER — Other Ambulatory Visit (HOSPITAL_COMMUNITY)
Admission: RE | Admit: 2020-07-21 | Discharge: 2020-07-21 | Disposition: A | Discharge status: Home / Self Care | Payer: BC Managed Care – PPO | Source: Ambulatory Visit | Attending: Advanced Practice Midwife | Admitting: Advanced Practice Midwife

## 2020-07-21 ENCOUNTER — Other Ambulatory Visit: Payer: Self-pay

## 2020-07-21 ENCOUNTER — Encounter: Payer: BC Managed Care – PPO | Admitting: Advanced Practice Midwife

## 2020-07-21 VITALS — BP 135/79 | HR 92 | Wt 201.0 lb

## 2020-07-21 DIAGNOSIS — Z3483 Encounter for supervision of other normal pregnancy, third trimester: Secondary | ICD-10-CM

## 2020-07-21 DIAGNOSIS — O09299 Supervision of pregnancy with other poor reproductive or obstetric history, unspecified trimester: Secondary | ICD-10-CM

## 2020-07-21 DIAGNOSIS — Z8759 Personal history of other complications of pregnancy, childbirth and the puerperium: Secondary | ICD-10-CM

## 2020-07-21 DIAGNOSIS — Z3A36 36 weeks gestation of pregnancy: Secondary | ICD-10-CM | POA: Diagnosis not present

## 2020-07-21 DIAGNOSIS — Z3480 Encounter for supervision of other normal pregnancy, unspecified trimester: Secondary | ICD-10-CM

## 2020-07-21 NOTE — Progress Notes (Addendum)
[place] 36+3 wks,cephalic,posterior placenta gr 3,afi 19 cm,fhr 132 bpm,EFW 3264 g 82%,AC 94%,BPD 90%

## 2020-07-21 NOTE — Patient Instructions (Signed)

## 2020-07-21 NOTE — Progress Notes (Signed)
   LOW-RISK PREGNANCY VISIT Patient name: Ariel Hale MRN [id number]  Date of birth: [birth year] Chief Complaint:   Routine Prenatal Visit  History of Present Illness:   Ariel Hale is a 30 y.o. G67P1001 female at [redacted]w[redacted]d with an Estimated Date of Delivery: 08/15/20 being seen today for ongoing management of a low-risk pregnancy.  Today she reports no complaints. Contractions: Irritability. Vag. Bleeding: None.  Movement: Present. denies leaking of fluid. Review of Systems:   Pertinent items are noted in HPI Denies abnormal vaginal discharge w/ itching/odor/irritation, headaches, visual changes, shortness of breath, chest pain, abdominal pain, severe nausea/vomiting, or problems with urination or bowel movements unless otherwise stated above. Pertinent History Reviewed:  Reviewed past medical,surgical, social, obstetrical and family history.  Reviewed problem list, medications and allergies. Physical Assessment:   Vitals:   07/21/20 1436  BP: 135/79  Pulse: 92  Weight: 201 lb (91.2 kg)  Body mass index is 31.48 kg/m.        Physical Examination:   General appearance: Well appearing, and in no distress  Mental status: Alert, oriented to person, place, and time  Skin: Warm & dry  Cardiovascular: Normal heart rate noted  Respiratory: Normal respiratory effort, no distress  Abdomen: Soft, gravid, nontender  Pelvic: Cervical exam performed  Dilation: 1 Effacement (%): Thick Station: -3  Extremities: Edema: Trace  Fetal Status:     Movement: Present Presentation: Vertex [place] 36+3 wks,cephalic,posterior placenta gr 3,afi 19 cm,fhr 132 bpm,EFW 3264 g 82%,AC 94%,BPD 90%  Chaperone: Amanda Rash    No results found for this or any previous visit (from the past 24 hour(s)).  Assessment & Plan:  1) Low-risk pregnancy G2P1001 at [redacted]w[redacted]d with an Estimated Date of Delivery: 08/15/20      Meds: No orders of the defined types were placed in this encounter.  Labs/procedures today:  GBS/GC/CHL  Plan:  Continue routine obstetrical care  Next visit: prefers in person    Reviewed: Term labor symptoms and general obstetric precautions including but not limited to vaginal bleeding, contractions, leaking of fluid and fetal movement were reviewed in detail with the patient.  All questions were answered. Has home bp cuff.. Check bp weekly, let us know if >140/90.   Follow-up: Return in about 1 week (around 07/28/2020) for LROB.  Orders Placed This Encounter  Procedures  . Culture, beta strep (group b only)   Jacklyn Shell DNP, CNM 07/21/2020 3:30 PM

## 2020-07-22 LAB — CERVICOVAGINAL ANCILLARY ONLY
Chlamydia: NEGATIVE
Comment: NEGATIVE
Comment: NORMAL
Neisseria Gonorrhea: NEGATIVE

## 2020-07-24 LAB — CULTURE, BETA STREP (GROUP B ONLY): Strep Gp B Culture: POSITIVE — AB

## 2020-07-27 ENCOUNTER — Encounter: Payer: Self-pay | Admitting: Obstetrics & Gynecology

## 2020-07-27 ENCOUNTER — Other Ambulatory Visit: Payer: Self-pay

## 2020-07-27 ENCOUNTER — Ambulatory Visit (INDEPENDENT_AMBULATORY_CARE_PROVIDER_SITE_OTHER): Payer: BC Managed Care – PPO | Admitting: Obstetrics & Gynecology

## 2020-07-27 VITALS — BP 125/81 | HR 88 | Wt 201.5 lb

## 2020-07-27 DIAGNOSIS — Z3480 Encounter for supervision of other normal pregnancy, unspecified trimester: Secondary | ICD-10-CM | POA: Diagnosis not present

## 2020-07-27 DIAGNOSIS — Z8632 Personal history of gestational diabetes: Secondary | ICD-10-CM

## 2020-07-27 NOTE — Progress Notes (Signed)
LOW-RISK PREGNANCY VISIT Patient name: Ariel Hale MRN [id number]  Date of birth: December 20, 1990 Chief Complaint:   Routine Prenatal Visit  History of Present Illness:   Ariel Hale is a 30 y.o. G55P1001 female at [redacted]w[redacted]d with an Estimated Date of Delivery: 08/15/20 being seen today for ongoing management of a low-risk pregnancy.  Depression screen [facility] 2/9 05/26/2020 02/03/2020 12/25/2019 01/06/2019  Decreased Interest 0 0 0 2  Down, Depressed, Hopeless 1 0 0 3  PHQ - 2 Score 1 0 0 5  Altered sleeping 0 0 0 3  Tired, decreased energy 1 2 0 1  Change in appetite 0 0 0 3  Feeling bad or failure about yourself  0 0 0 3  Trouble concentrating 0 0 0 1  Moving slowly or fidgety/restless 0 0 0 1  Suicidal thoughts 0 0 0 3  PHQ-9 Score 2 2 0 20  Difficult doing work/chores Not difficult at all - Not difficult at all Very difficult    Today she reports generalized discomfort. Contractions: Irregular. Vag. Bleeding: None.  Movement: (!) Decreased. denies leaking of fluid. Review of Systems:   Pertinent items are noted in HPI Denies abnormal vaginal discharge w/ itching/odor/irritation, headaches, visual changes, shortness of breath, chest pain, abdominal pain, severe nausea/vomiting, or problems with urination or bowel movements unless otherwise stated above. Pertinent History Reviewed:  Reviewed past medical,surgical, social, obstetrical and family history.  Reviewed problem list, medications and allergies.  Physical Assessment:   Vitals:   07/27/20 1539  BP: 125/81  Pulse: 88  Weight: 201 lb 8 oz (91.4 kg)  Body mass index is 31.56 kg/m.        Physical Examination:   General appearance: Well appearing, and in no distress  Mental status: Alert, oriented to person, place, and time  Skin: Warm & dry  Respiratory: Normal respiratory effort, no distress  Abdomen: Soft, gravid, nontender  Pelvic: Cervical exam performed  Dilation: 1 Effacement (%): Thick Station:  -3  Extremities: Edema: Trace  Psych:  mood and affect appropriate  Fetal Status: Fetal Heart Rate (bpm): 135-NST reactive Fundal Height: 36 cm Movement: (!) Decreased Presentation: Vertex  Chaperone: declined    NST being performed due to decreased fetal movement   Fetal Monitoring:  Baseline: 135 bpm, Variability: moderate, Accelerations: +accels, The accelerations are >15 bpm and more than 2 in 20 minutes, and Decelerations: Absent   reactive   Final diagnosis:  Reactive NST  Assessment & Plan:  1) Low-risk pregnancy G2P1001 at [redacted]w[redacted]d with an Estimated Date of Delivery: 08/15/20   2) GBS positive- reviewed these findings and plan for PCN in labor 3) h/o gestational HTN- not taking ASA- encouraged to take 4) Dep/Anxiety- doing well with zoloft daily  Pt would consider elective IOL as she is ready to have the baby.   Meds: No orders of the defined types were placed in this encounter.  Labs/procedures today: NST  Plan:  Continue routine obstetrical care Next visit: prefers in person    Reviewed: Term labor symptoms and general obstetric precautions including but not limited to vaginal bleeding, contractions, leaking of fluid and fetal movement were reviewed in detail with the patient.  All questions were answered. Pt has home bp cuff. Check bp weekly, let us know if >140/90.   Follow-up: Return in about 1 week (around 08/03/2020) for LROB visit.  No orders of the defined types were placed in this encounter.   Myna Hidalgo, DO Attending Obstetrician & Gynecologist, Faculty  [facility] [employer], [facility]

## 2020-07-28 ENCOUNTER — Encounter: Payer: BC Managed Care – PPO | Admitting: Advanced Practice Midwife

## 2020-08-04 ENCOUNTER — Ambulatory Visit (INDEPENDENT_AMBULATORY_CARE_PROVIDER_SITE_OTHER): Payer: BC Managed Care – PPO | Admitting: Women's Health

## 2020-08-04 ENCOUNTER — Other Ambulatory Visit: Payer: Self-pay

## 2020-08-04 ENCOUNTER — Encounter: Payer: Self-pay | Admitting: Women's Health

## 2020-08-04 VITALS — BP 124/76 | HR 93 | Wt 202.0 lb

## 2020-08-04 DIAGNOSIS — Z3480 Encounter for supervision of other normal pregnancy, unspecified trimester: Secondary | ICD-10-CM

## 2020-08-04 DIAGNOSIS — Z3483 Encounter for supervision of other normal pregnancy, third trimester: Secondary | ICD-10-CM

## 2020-08-04 NOTE — Progress Notes (Signed)
   LOW-RISK PREGNANCY VISIT Patient name: Ariel Hale MRN [id number]  Date of birth: 1990/12/07 Chief Complaint:   Routine Prenatal Visit  History of Present Illness:   Ariel Hale is a 30 y.o. G77P1001 female at [redacted]w[redacted]d with an Estimated Date of Delivery: 08/15/20 being seen today for ongoing management of a low-risk pregnancy.  Depression screen [facility] 2/9 05/26/2020 02/03/2020 12/25/2019 01/06/2019  Decreased Interest 0 0 0 2  Down, Depressed, Hopeless 1 0 0 3  PHQ - 2 Score 1 0 0 5  Altered sleeping 0 0 0 3  Tired, decreased energy 1 2 0 1  Change in appetite 0 0 0 3  Feeling bad or failure about yourself  0 0 0 3  Trouble concentrating 0 0 0 1  Moving slowly or fidgety/restless 0 0 0 1  Suicidal thoughts 0 0 0 3  PHQ-9 Score 2 2 0 20  Difficult doing work/chores Not difficult at all - Not difficult at all Very difficult    Today she reports no complaints. Contractions: Irregular. Vag. Bleeding: None.  Movement: Present. denies leaking of fluid. Review of Systems:   Pertinent items are noted in HPI Denies abnormal vaginal discharge w/ itching/odor/irritation, headaches, visual changes, shortness of breath, chest pain, abdominal pain, severe nausea/vomiting, or problems with urination or bowel movements unless otherwise stated above. Pertinent History Reviewed:  Reviewed past medical,surgical, social, obstetrical and family history.  Reviewed problem list, medications and allergies. Physical Assessment:   Vitals:   08/04/20 1450  BP: 124/76  Pulse: 93  Weight: 202 lb (91.6 kg)  Body mass index is 31.64 kg/m.        Physical Examination:   General appearance: Well appearing, and in no distress  Mental status: Alert, oriented to person, place, and time  Skin: Warm & dry  Cardiovascular: Normal heart rate noted  Respiratory: Normal respiratory effort, no distress  Abdomen: Soft, gravid, nontender  Pelvic: Cervical exam performed  Dilation: 3 Effacement (%): 50  Station: -3  Extremities: Edema: Trace  Fetal Status: Fetal Heart Rate (bpm): 133 Fundal Height: 37 cm Movement: Present Presentation: Vertex  Chaperone: Malachy Mood   No results found for this or any previous visit (from the past 24 hour(s)).  Assessment & Plan:  1) Low-risk pregnancy G2P1001 at [redacted]w[redacted]d with an Estimated Date of Delivery: 08/15/20   2) H/O FGR, EFW 82% @ 36wks   Meds: No orders of the defined types were placed in this encounter.  Labs/procedures today: SVE  Plan:  Continue routine obstetrical care  Next visit: prefers in person    Reviewed: Term labor symptoms and general obstetric precautions including but not limited to vaginal bleeding, contractions, leaking of fluid and fetal movement were reviewed in detail with the patient.  All questions were answered. Does have home bp cuff. Office bp cuff given: not applicable. Check bp weekly, let us know if consistently >140 and/or >90.  Follow-up: Return in about 1 week (around 08/11/2020) for LROB, CNM, in person.  No future appointments.  No orders of the defined types were placed in this encounter.  Cheral Marker CNM, [facility] 08/04/2020 3:24 PM

## 2020-08-04 NOTE — Patient Instructions (Signed)
[facility], I greatly value your feedback.  If you receive a survey following your visit with [place] today, we appreciate you taking the time to fill it out.  Thanks, Joellyn Haff, CNM, WHNP-BC  [facility] ([address] [place], [place] [address]) Entrance C, located off of [address] [employer] [address] valet parking   Go to [email or web address] to register for FREE online childbirth classes    Call the office [phone]) or go to [facility] if:  You begin to have strong, frequent contractions  Your water breaks.  Sometimes it is a big gush of fluid, sometimes it is just a trickle that keeps getting your panties wet or running down your legs  You have vaginal bleeding.  It is normal to have a small amount of spotting if your cervix was checked.   You don't feel your baby moving like normal.  If you don't, get you something to eat and drink and lay down and focus on feeling your baby move.  You should feel at least 10 movements in 2 hours.  If you don't, you should call the office or go to [facility].   Call the office [phone]) or go to [facility] hospital for these signs of pre-eclampsia:  Severe headache that does not go away with Tylenol  Visual changes- seeing spots, double, blurred vision  Pain under your right breast or upper abdomen that does not go away with Tums or heartburn medicine  Nausea and/or vomiting  Severe swelling in your hands, feet, and face    Home Blood Pressure Monitoring for Patients   Your provider has recommended that you check your blood pressure (BP) at least once a week at home. If you do not have a blood pressure cuff at home, one will be provided for you. Contact your provider if you have not received your monitor within 1 week.   Helpful Tips for Accurate Home Blood Pressure Checks  . Don't smoke, exercise, or drink caffeine 30 minutes before checking your BP . Use the restroom before checking your BP (a full  bladder can raise your pressure) . Relax in a comfortable upright chair . Feet on the ground . Left arm resting comfortably on a flat surface at the level of your heart . Legs uncrossed . Back supported . Sit quietly and don't talk . Place the cuff on your bare arm . Adjust snuggly, so that only two fingertips can fit between your skin and the top of the cuff . Check 2 readings separated by at least one minute . Keep a log of your BP readings . For a visual, please reference this diagram: http://ccnc.care/bpdiagram  Provider Name: [facility]     Phone: [phone]  Zone 1: ALL CLEAR  Continue to monitor your symptoms:  . BP reading is less than 140 (top number) or less than 90 (bottom number)  . No right upper stomach pain . No headaches or seeing spots . No feeling nauseated or throwing up . No swelling in face and hands  Zone 2: CAUTION Call your doctor's office for any of the following:  . BP reading is greater than 140 (top number) or greater than 90 (bottom number)  . Stomach pain under your ribs in the middle or right side . Headaches or seeing spots . Feeling nauseated or throwing up . Swelling in face and hands  Zone 3: EMERGENCY  Seek immediate medical care if you have any of  the following:  . BP reading is greater than160 (top number) or greater than 110 (bottom number) . Severe headaches not improving with Tylenol . Serious difficulty catching your breath . Any worsening symptoms from Zone 2   Braxton Hicks Contractions Contractions of the uterus can occur throughout pregnancy, but they are not always a sign that you are in labor. You may have practice contractions called Braxton Hicks contractions. These false labor contractions are sometimes confused with true labor. What are Braxton Hicks contractions? Braxton Hicks contractions are tightening movements that occur in the muscles of the uterus before labor. Unlike true labor contractions, these  contractions do not result in opening (dilation) and thinning of the cervix. Toward the end of pregnancy (32-34 weeks), Braxton Hicks contractions can happen more often and may become stronger. These contractions are sometimes difficult to tell apart from true labor because they can be very uncomfortable. You should not feel embarrassed if you go to the hospital with false labor. Sometimes, the only way to tell if you are in true labor is for your health care provider to look for changes in the cervix. The health care provider will do a physical exam and may monitor your contractions. If you are not in true labor, the exam should show that your cervix is not dilating and your water has not broken. If there are no other health problems associated with your pregnancy, it is completely safe for you to be sent home with false labor. You may continue to have Braxton Hicks contractions until you go into true labor. How to tell the difference between true labor and false labor True labor  Contractions last 30-70 seconds.  Contractions become very regular.  Discomfort is usually felt in the top of the uterus, and it spreads to the lower abdomen and low back.  Contractions do not go away with walking.  Contractions usually become more intense and increase in frequency.  The cervix dilates and gets thinner. False labor  Contractions are usually shorter and not as strong as true labor contractions.  Contractions are usually irregular.  Contractions are often felt in the front of the lower abdomen and in the groin.  Contractions may go away when you walk around or change positions while lying down.  Contractions get weaker and are shorter-lasting as time goes on.  The cervix usually does not dilate or become thin. Follow these instructions at home:  1. Take over-the-counter and prescription medicines only as told by your health care provider. 2. Keep up with your usual exercises and follow other  instructions from your health care provider. 3. Eat and drink lightly if you think you are going into labor. 4. If Braxton Hicks contractions are making you uncomfortable: ? Change your position from lying down or resting to walking, or change from walking to resting. ? Sit and rest in a tub of warm water. ? Drink enough fluid to keep your urine pale yellow. Dehydration may cause these contractions. ? Do slow and deep breathing several times an hour. 5. Keep all follow-up prenatal visits as told by your health care provider. This is important. Contact a health care provider if:  You have a fever.  You have continuous pain in your abdomen. Get help right away if:  Your contractions become stronger, more regular, and closer together.  You have fluid leaking or gushing from your vagina.  You pass blood-tinged mucus (bloody show).  You have bleeding from your vagina.  You have low back   pain that you never had before.  You feel your baby's head pushing down and causing pelvic pressure.  Your baby is not moving inside you as much as it used to. Summary  Contractions that occur before labor are called Braxton Hicks contractions, false labor, or practice contractions.  Braxton Hicks contractions are usually shorter, weaker, farther apart, and less regular than true labor contractions. True labor contractions usually become progressively stronger and regular, and they become more frequent.  Manage discomfort from [facility] contractions by changing position, resting in a warm bath, drinking plenty of water, or practicing deep breathing. This information is not intended to replace advice given to you by your health care provider. Make sure you discuss any questions you have with your health care provider. Document Revised: 03/08/2017 Document Reviewed: 08/09/2016 Elsevier Patient Education  [place].

## 2020-08-07 ENCOUNTER — Inpatient Hospital Stay (HOSPITAL_COMMUNITY): Payer: BC Managed Care – PPO | Admitting: Anesthesiology

## 2020-08-07 ENCOUNTER — Other Ambulatory Visit: Payer: Self-pay

## 2020-08-07 ENCOUNTER — Encounter (HOSPITAL_COMMUNITY): Payer: Self-pay | Admitting: Obstetrics and Gynecology

## 2020-08-07 ENCOUNTER — Inpatient Hospital Stay (HOSPITAL_COMMUNITY)
Admission: AD | Admit: 2020-08-07 | Discharge: 2020-08-09 | DRG: 807 | Disposition: A | Discharge status: Home / Self Care | Payer: BC Managed Care – PPO | Attending: Obstetrics and Gynecology | Admitting: Obstetrics and Gynecology

## 2020-08-07 DIAGNOSIS — Z3A38 38 weeks gestation of pregnancy: Secondary | ICD-10-CM

## 2020-08-07 DIAGNOSIS — F418 Other specified anxiety disorders: Secondary | ICD-10-CM

## 2020-08-07 DIAGNOSIS — O99824 Streptococcus B carrier state complicating childbirth: Secondary | ICD-10-CM | POA: Diagnosis not present

## 2020-08-07 DIAGNOSIS — O9942 Diseases of the circulatory system complicating childbirth: Secondary | ICD-10-CM | POA: Diagnosis not present

## 2020-08-07 DIAGNOSIS — Z3480 Encounter for supervision of other normal pregnancy, unspecified trimester: Secondary | ICD-10-CM

## 2020-08-07 DIAGNOSIS — O99344 Other mental disorders complicating childbirth: Secondary | ICD-10-CM | POA: Diagnosis not present

## 2020-08-07 DIAGNOSIS — F32A Depression, unspecified: Secondary | ICD-10-CM | POA: Diagnosis not present

## 2020-08-07 DIAGNOSIS — Z20822 Contact with and (suspected) exposure to covid-19: Secondary | ICD-10-CM | POA: Diagnosis present

## 2020-08-07 DIAGNOSIS — O26893 Other specified pregnancy related conditions, third trimester: Secondary | ICD-10-CM | POA: Diagnosis not present

## 2020-08-07 DIAGNOSIS — F419 Anxiety disorder, unspecified: Secondary | ICD-10-CM | POA: Diagnosis present

## 2020-08-07 DIAGNOSIS — I498 Other specified cardiac arrhythmias: Secondary | ICD-10-CM | POA: Diagnosis not present

## 2020-08-07 DIAGNOSIS — O09299 Supervision of pregnancy with other poor reproductive or obstetric history, unspecified trimester: Secondary | ICD-10-CM

## 2020-08-07 LAB — RESP PANEL BY RT-PCR (FLU A&B, COVID) ARPGX2
Influenza A by PCR: NEGATIVE
Influenza B by PCR: NEGATIVE
SARS Coronavirus 2 by RT PCR: NEGATIVE

## 2020-08-07 LAB — CBC
HCT: 36.5 % (ref 36.0–46.0)
Hemoglobin: 11.8 g/dL — ABNORMAL LOW (ref 12.0–15.0)
MCH: 28.6 pg (ref 26.0–34.0)
MCHC: 32.3 g/dL (ref 30.0–36.0)
MCV: 88.4 fL (ref 80.0–100.0)
Platelets: 232 10*3/uL (ref 150–400)
RBC: 4.13 MIL/uL (ref 3.87–5.11)
RDW: 14.4 % (ref 11.5–15.5)
WBC: 8 10*3/uL (ref 4.0–10.5)
nRBC: 0 % (ref 0.0–0.2)

## 2020-08-07 LAB — TYPE AND SCREEN
ABO/RH(D): O POS
Antibody Screen: NEGATIVE

## 2020-08-07 MED ORDER — ACETAMINOPHEN 325 MG PO TABS: 650.0000 mg | ORAL_TABLET | ORAL | Status: DC | PRN

## 2020-08-07 MED ORDER — COCONUT OIL OIL: 1.0000 "application " | TOPICAL_OIL | Status: DC | PRN

## 2020-08-07 MED ORDER — OXYTOCIN-SODIUM CHLORIDE 30-0.9 UT/500ML-% IV SOLN
2.5000 [IU]/h | INTRAVENOUS | Status: DC
  Administered 2020-08-07: 2.5 [IU]/h via INTRAVENOUS
  Filled 2020-08-07: qty 500

## 2020-08-07 MED ORDER — LACTATED RINGERS IV SOLN: 500.0000 mL | INTRAVENOUS | Status: DC | PRN

## 2020-08-07 MED ORDER — FENTANYL-BUPIVACAINE-NACL 0.5-0.125-0.9 MG/250ML-% EP SOLN
EPIDURAL | Status: DC | PRN
  Administered 2020-08-07: 12 mL/h via EPIDURAL

## 2020-08-07 MED ORDER — SODIUM CHLORIDE 0.9 % IV SOLN: 1.0000 g | INTRAVENOUS | Status: DC

## 2020-08-07 MED ORDER — OXYTOCIN BOLUS FROM INFUSION
333.0000 mL | Freq: Once | INTRAVENOUS | Status: AC
  Administered 2020-08-07: 333 mL via INTRAVENOUS

## 2020-08-07 MED ORDER — SODIUM CHLORIDE 0.9 % IV SOLN
2.0000 g | Freq: Once | INTRAVENOUS | Status: AC
  Administered 2020-08-07: 2 g via INTRAVENOUS
  Filled 2020-08-07: qty 2000

## 2020-08-07 MED ORDER — ONDANSETRON HCL 4 MG/2ML IJ SOLN: 4.0000 mg | INTRAMUSCULAR | Status: DC | PRN

## 2020-08-07 MED ORDER — WITCH HAZEL-GLYCERIN EX PADS: 1.0000 "application " | MEDICATED_PAD | CUTANEOUS | Status: DC | PRN

## 2020-08-07 MED ORDER — ONDANSETRON HCL 4 MG PO TABS: 4.0000 mg | ORAL_TABLET | ORAL | Status: DC | PRN

## 2020-08-07 MED ORDER — DIPHENHYDRAMINE HCL 25 MG PO CAPS: 25.0000 mg | ORAL_CAPSULE | Freq: Four times a day (QID) | ORAL | Status: DC | PRN

## 2020-08-07 MED ORDER — FENTANYL-BUPIVACAINE-NACL 0.5-0.125-0.9 MG/250ML-% EP SOLN
12.0000 mL/h | EPIDURAL | Status: DC | PRN
  Filled 2020-08-07: qty 250

## 2020-08-07 MED ORDER — SOD CITRATE-CITRIC ACID 500-334 MG/5ML PO SOLN: 30.0000 mL | ORAL | Status: DC | PRN

## 2020-08-07 MED ORDER — LIDOCAINE HCL (PF) 1 % IJ SOLN: 30.0000 mL | INTRAMUSCULAR | Status: DC | PRN

## 2020-08-07 MED ORDER — BENZOCAINE-MENTHOL 20-0.5 % EX AERO
1.0000 "application " | INHALATION_SPRAY | CUTANEOUS | Status: DC | PRN
  Administered 2020-08-07: 1 via TOPICAL
  Filled 2020-08-07: qty 56

## 2020-08-07 MED ORDER — LIDOCAINE HCL (PF) 1 % IJ SOLN
INTRAMUSCULAR | Status: DC | PRN
  Administered 2020-08-07: 5 mL via EPIDURAL

## 2020-08-07 MED ORDER — PHENYLEPHRINE 40 MCG/ML (10ML) SYRINGE FOR IV PUSH (FOR BLOOD PRESSURE SUPPORT)
80.0000 ug | PREFILLED_SYRINGE | INTRAVENOUS | Status: DC | PRN
  Administered 2020-08-07 (×2): 80 ug via INTRAVENOUS

## 2020-08-07 MED ORDER — DIPHENHYDRAMINE HCL 50 MG/ML IJ SOLN: 12.5000 mg | INTRAMUSCULAR | Status: DC | PRN

## 2020-08-07 MED ORDER — TETANUS-DIPHTH-ACELL PERTUSSIS 5-2.5-18.5 LF-MCG/0.5 IM SUSY: 0.5000 mL | PREFILLED_SYRINGE | Freq: Once | INTRAMUSCULAR | Status: DC

## 2020-08-07 MED ORDER — PRENATAL MULTIVITAMIN CH
1.0000 | ORAL_TABLET | Freq: Every day | ORAL | Status: DC
  Administered 2020-08-09: 1 via ORAL
  Filled 2020-08-07 (×2): qty 1

## 2020-08-07 MED ORDER — MEASLES, MUMPS & RUBELLA VAC IJ SOLR: 0.5000 mL | Freq: Once | INTRAMUSCULAR | Status: DC

## 2020-08-07 MED ORDER — PHENYLEPHRINE 40 MCG/ML (10ML) SYRINGE FOR IV PUSH (FOR BLOOD PRESSURE SUPPORT)
80.0000 ug | PREFILLED_SYRINGE | INTRAVENOUS | Status: DC | PRN
  Administered 2020-08-07: 80 ug via INTRAVENOUS
  Filled 2020-08-07: qty 10

## 2020-08-07 MED ORDER — SIMETHICONE 80 MG PO CHEW: 80.0000 mg | CHEWABLE_TABLET | ORAL | Status: DC | PRN

## 2020-08-07 MED ORDER — LACTATED RINGERS IV SOLN: INTRAVENOUS | Status: DC

## 2020-08-07 MED ORDER — LACTATED RINGERS IV SOLN: 500.0000 mL | Freq: Once | INTRAVENOUS | Status: DC

## 2020-08-07 MED ORDER — SENNOSIDES-DOCUSATE SODIUM 8.6-50 MG PO TABS
2.0000 | ORAL_TABLET | ORAL | Status: DC
  Filled 2020-08-07: qty 2

## 2020-08-07 MED ORDER — DIBUCAINE (PERIANAL) 1 % EX OINT: 1.0000 "application " | TOPICAL_OINTMENT | CUTANEOUS | Status: DC | PRN

## 2020-08-07 MED ORDER — IBUPROFEN 600 MG PO TABS
600.0000 mg | ORAL_TABLET | Freq: Four times a day (QID) | ORAL | Status: DC
  Administered 2020-08-07 – 2020-08-09 (×7): 600 mg via ORAL
  Filled 2020-08-07 (×8): qty 1

## 2020-08-07 MED ORDER — SERTRALINE HCL 25 MG PO TABS
25.0000 mg | ORAL_TABLET | Freq: Every day | ORAL | Status: DC
  Filled 2020-08-07: qty 1

## 2020-08-07 MED ORDER — ONDANSETRON HCL 4 MG/2ML IJ SOLN: 4.0000 mg | Freq: Four times a day (QID) | INTRAMUSCULAR | Status: DC | PRN

## 2020-08-07 MED ORDER — EPHEDRINE 5 MG/ML INJ: 10.0000 mg | INTRAVENOUS | Status: DC | PRN

## 2020-08-07 MED ORDER — AMMONIA AROMATIC IN INHA
RESPIRATORY_TRACT | Status: AC
  Filled 2020-08-07: qty 10

## 2020-08-07 NOTE — Anesthesia Procedure Notes (Signed)
Epidural Patient location during procedure: OB Start time: 08/07/2020 10:18 AM End time: 08/07/2020 10:28 AM  Staffing Anesthesiologist: Trevor Iha, MD Performed: anesthesiologist   Preanesthetic Checklist Completed: patient identified, IV checked, site marked, risks and benefits discussed, surgical consent, monitors and equipment checked, pre-op evaluation and timeout performed  Epidural Patient position: sitting Prep: DuraPrep and site prepped and draped Patient monitoring: continuous pulse ox and blood pressure Approach: midline Location: L3-L4 Injection technique: LOR air  Needle:  Needle type: Tuohy  Needle gauge: 17 G Needle length: 9 cm and 9 Needle insertion depth: 8 cm Catheter type: closed end flexible Catheter size: 19 Gauge Catheter at skin depth: 14 cm Test dose: negative  Assessment Events: blood not aspirated, injection not painful, no injection resistance, no paresthesia and negative IV test  Additional Notes Patient identified. Risks/Benefits/Options discussed with patient including but not limited to bleeding, infection, nerve damage, paralysis, failed block, incomplete pain control, headache, blood pressure changes, nausea, vomiting, reactions to medication both or allergic, itching and postpartum back pain. Confirmed with bedside nurse the patient's most recent platelet count. Confirmed with patient that they are not currently taking any anticoagulation, have any bleeding history or any family history of bleeding disorders. Patient expressed understanding and wished to proceed. All questions were answered. Sterile technique was used throughout the entire procedure. Please see nursing notes for vital signs. Test dose was given through epidural needle and negative prior to continuing to dose epidural or start infusion. Warning signs of high block given to the patient including shortness of breath, tingling/numbness in hands, complete motor block, or any  concerning symptoms with instructions to call for help. Patient was given instructions on fall risk and not to get out of bed. All questions and concerns addressed with instructions to call with any issues.1  Attempt (S) . Patient tolerated procedure well.

## 2020-08-07 NOTE — Anesthesia Postprocedure Evaluation (Signed)
Anesthesia Post Note  Patient: Ariel Hale  Procedure(s) Performed: AN AD HOC LABOR EPIDURAL     Patient location during evaluation: Mother Baby Anesthesia Type: Epidural Level of consciousness: awake Pain management: satisfactory to patient Vital Signs Assessment: post-procedure vital signs reviewed and stable Respiratory status: spontaneous breathing Cardiovascular status: stable Anesthetic complications: no   No complications documented.  Last Vitals:  Vitals:   08/07/20 1318 08/07/20 1430  BP: 128/77 112/76  Pulse: (!) 58 82  Resp: 18 19  Temp: 36.7 C 36.9 C    Last Pain:  Vitals:   08/07/20 1630  TempSrc:   PainSc: 0-No pain   Pain Goal:                   [employer]

## 2020-08-07 NOTE — Plan of Care (Signed)
Pt demonstrated understanding 

## 2020-08-07 NOTE — Lactation Note (Signed)
This note was copied from a baby's chart. Lactation Consultation Note  Patient Name: Ariel Hale RWERX'V Date: 08/07/2020 Reason for consult: Follow-up assessment;Early term 20-38.6wks Age:30 years  Visited with mom of 9 hours old ETI female, she's a P2 and reported (+) breast changes during the pregnancy. Mom has been pre-pumping prior feeding on the right breast due to her inverted nipple, day shift RN also got a NS# 20 but she hasn't used it yet because baby has been latching on consistently after doing some pre-pumping.  LC assisted with latch to the left breast in cross cradle position per mom's request. Baby latched easily, and required very little repositioning (just the bottom lip), long strokes with audible swallows upon breast compressions noted during this 10 minutes feeding. Reviewed normal newborn behavior, feeding cues, cluster feeding, size of baby's stomach and pumping schedule.  Feeding plan:  1. Encouraged mom to feed baby STS 8-12 times/24 hours or sooner if feeding cues are present 2. She'll continue pre-pumping prior feedings on the right breast 3. She'll start using her breast shells after discharge; daytime only (didn't bring a nursing bra that fits to the hospital)  BF brochure, BF resources and feeding diary were reviewed. FOB present and very supportive. Parents reported all questions and concerns were answered, they're both aware of LC OP services and will call PRN.   Maternal Data Has patient been taught Hand Expression?: Yes Does the patient have breastfeeding experience prior to this delivery?: Yes How long did the patient breastfeed?: 6 weeks  Feeding Mother's Current Feeding Choice: Breast Milk  LATCH Score Latch: Grasps breast easily, tongue down, lips flanged, rhythmical sucking.  Audible Swallowing: A few with stimulation  Type of Nipple: Everted at rest and after stimulation (left side)  Comfort (Breast/Nipple): Soft / non-tender  Hold  (Positioning): Assistance needed to correctly position infant at breast and maintain latch.  LATCH Score: 8   Lactation Tools Discussed/Used Tools: Pump;Shells Breast pump type: Manual Pump Education: Setup, frequency, and cleaning;Milk Storage Reason for Pumping: inverted nipple (right breast) Pumping frequency: prior feedings at the breast (pre-pumping) Pumped volume:  (drops)  Interventions Interventions: Breast feeding basics reviewed;Hand pump;Shells;Assisted with latch;Skin to skin;Breast compression;Adjust position;Support pillows;Education  Discharge Pump: Personal;Manual ([facility] DEBP at home) [facility] Program: Yes  Consult Status Consult Status: Follow-up Date: 08/08/20 Follow-up type: In-patient    Zaid Tomes [place]ia Constable 08/07/2020, 8:43 PM

## 2020-08-07 NOTE — Discharge Summary (Addendum)
Postpartum Discharge Summary  Date of Service updated     Patient Name: Ariel Hale DOB: [birth year] MRN: [id number]  Date of admission: 08/07/2020 Delivery date:08/07/2020  Delivering provider: Julianne Handler  Date of discharge: 08/12/2020  Admitting diagnosis: Indication for care in labor or delivery [O75.9] Intrauterine pregnancy: [redacted]w[redacted]d     Secondary diagnosis:  Active Problems:   Indication for care in labor or delivery   SVD (spontaneous vaginal delivery)  Additional problems: GBS carrier, anxiety/depression, POTS, hx IUGR in previous pregnancy    Discharge diagnosis: Term Pregnancy Delivered                                              Post partum procedures: None Augmentation: N/A Complications: None  Hospital course: Onset of Labor With Vaginal Delivery      30 y.o. yo G2P1001 at [redacted]w[redacted]d was admitted in Active Labor on 08/07/2020. Patient had an uncomplicated labor course as follows:  Membrane Rupture Time/Date: 11:26 AM ,08/07/2020   Delivery Method:Vaginal, Spontaneous  Episiotomy: None  Lacerations:  None  Patient had an uncomplicated postpartum course.  She is ambulating, tolerating a regular diet, passing flatus, and urinating well. Patient is discharged home in stable condition on 08/12/20.  Newborn Data: Birth date:08/07/2020  Birth time:11:26 AM  Gender:Female  Living status:Living  Apgars:9 ,9  Weight:3820 g   Magnesium Sulfate received: No BMZ received: No Rhophylac:N/A MMR:N/A T-DaP: declined Flu: No Transfusion:No  Physical exam  Vitals:   08/08/20 0517 08/08/20 1400 08/08/20 2029 08/09/20 0613  BP: 105/69 111/62 125/81 112/81  Pulse: 70 77 74 70  Resp: $Remo'18 18 18 18  'LjWYf$ Temp: 98.1 F (36.7 C) 98.1 F (36.7 C) 98.4 F (36.9 C) 98.2 F (36.8 C)  TempSrc: Oral Oral Oral Oral  SpO2: 100%  99% 100%  Weight:      Height:       General: alert, cooperative and no distress Lochia: appropriate Uterine Fundus: firm Incision: Healing well with  no significant drainage, No significant erythema DVT Evaluation: No evidence of DVT seen on physical exam. Labs: Lab Results  Component Value Date   WBC 8.0 08/07/2020   HGB 11.8 (L) 08/07/2020   HCT 36.5 08/07/2020   MCV 88.4 08/07/2020   PLT 232 08/07/2020   CMP Latest Ref Rng & Units 01/06/2019  Glucose 70 - 99 mg/dL 91  BUN 6 - 23 mg/dL 13  Creatinine 0.40 - 1.20 mg/dL 0.88  Sodium 135 - 145 mEq/L 138  Potassium 3.5 - 5.1 mEq/L 4.5  Chloride 96 - 112 mEq/L 103  CO2 19 - 32 mEq/L 29  Calcium 8.4 - 10.5 mg/dL 9.9  Total Protein 6.0 - 8.3 g/dL 7.2  Total Bilirubin 0.2 - 1.2 mg/dL 0.4  Alkaline Phos 39 - 117 U/L 51  AST 0 - 37 U/L 16  ALT 0 - 35 U/L 18   Edinburgh Score: Edinburgh Postnatal Depression Scale Screening Tool 08/08/2020  I have been able to laugh and see the funny side of things. 0  I have looked forward with enjoyment to things. 0  I have blamed myself unnecessarily when things went wrong. 1  I have been anxious or worried for no good reason. 2  I have felt scared or panicky for no good reason. 1  Things have been getting on top of me. 1  I have  been so unhappy that I have had difficulty sleeping. 0  I have felt sad or miserable. 0  I have been so unhappy that I have been crying. 0  The thought of harming myself has occurred to me. 0  Edinburgh Postnatal Depression Scale Total 5     After visit meds:  Allergies as of 08/09/2020       Reactions   Hydrocodone-acetaminophen Nausea Only   Pt can tolerate Acetaminophen and requests not to take Percocet   Penicillins Nausea And Vomiting   Vicodin [hydrocodone-acetaminophen] Nausea Only        Medication List     STOP taking these medications    aspirin 81 MG chewable tablet       TAKE these medications    coconut oil Oil Apply 1 application topically as needed.   ibuprofen 600 MG tablet Commonly known as: ADVIL Take 1 tablet (600 mg total) by mouth every 6 (six) hours.   PRENATAL PO Take 1  tablet by mouth daily.   sertraline 25 MG tablet Commonly known as: ZOLOFT TAKE 1 TABLET (25 MG TOTAL) BY MOUTH DAILY.   simethicone 80 MG chewable tablet Commonly known as: MYLICON Chew 1 tablet (80 mg total) by mouth as needed for flatulence.   TYLENOL PO Take by mouth.         Discharge home in stable condition Infant Feeding: Bottle and Breast Infant Disposition:home with mother Discharge instruction: per After Visit Summary and Postpartum booklet. Activity: Advance as tolerated. Pelvic rest for 6 weeks.  Diet: routine diet Future Appointments: Future Appointments  Date Time Provider [place]  09/13/2020  1:30 PM Roma Schanz, CNM [facility]   Follow up Visit:  Please schedule this patient for a In person postpartum visit in 4 weeks with the following provider: Any provider. Additional Postpartum F/U: none   Low risk pregnancy complicated by:  none Delivery mode:  Vaginal, Spontaneous  Anticipated Birth Control:  IUD  08/12/2020 Delora Fuel, MD I personally saw and evaluated the patient, performing the key elements of the service. I developed and verified the management plan that is described in the resident's/student's note, and I agree with the content with my edits above. VSS, HRR&R, Resp unlabored, Legs neg.  Nigel Berthold, CNM 08/17/2020 9:31 AM

## 2020-08-07 NOTE — Lactation Note (Signed)
This note was copied from a baby's chart. Lactation Consultation Note  Patient Name: Boy Chrysta Fulcher AQTMA'U Date: 08/07/2020 Reason for consult: L&D Initial assessment Age:30 hours  Baby cueing upon entering.  Baby latched with ease on L breast.  Mother states she had difficulty latching her first child who was born early. She has an inverted nipple on her R breast that everts some with hand expression. Lactation will follow up on MBU.  Feeding Mother's Current Feeding Choice: Breast Milk  LATCH Score Latch: Grasps breast easily, tongue down, lips flanged, rhythmical sucking.  Audible Swallowing: Spontaneous and intermittent  Type of Nipple: Everted at rest and after stimulation  Comfort (Breast/Nipple): Soft / non-tender  Hold (Positioning): Assistance needed to correctly position infant at breast and maintain latch.  LATCH Score: 9     Interventions Interventions: Breast feeding basics reviewed;Assisted with latch;Skin to skin;Hand express;Education     Consult Status Consult Status: Follow-up Date: 08/07/20 Follow-up type: In-patient    Dahlia Byes [facility] 08/07/2020, 12:13 PM

## 2020-08-07 NOTE — Anesthesia Preprocedure Evaluation (Addendum)
Anesthesia Evaluation  Patient identified by MRN, date of birth, ID band Patient awake    Reviewed: Allergy & Precautions, Patient's Chart, lab work & pertinent test results  Airway Mallampati: II  TM Distance: >3 FB Neck ROM: Full    Dental no notable dental hx. (+) Teeth Intact, Dental Advisory Given   Pulmonary    Pulmonary exam normal breath sounds clear to auscultation       Cardiovascular negative cardio ROS Normal cardiovascular exam Rhythm:Regular Rate:Normal     Neuro/Psych  Headaches, Anxiety    GI/Hepatic negative GI ROS, Neg liver ROS,   Endo/Other  negative endocrine ROS  Renal/GU negative Renal ROS     Musculoskeletal   Abdominal   Peds  Hematology Lab Results      Component                Value               Date                      WBC                      8.0                 08/07/2020                HGB                      11.8 (L)            08/07/2020                HCT                      36.5                08/07/2020                MCV                      88.4                08/07/2020                PLT                      232                 08/07/2020              Anesthesia Other Findings   Reproductive/Obstetrics (+) Pregnancy                            Anesthesia Physical Anesthesia Plan  ASA: II  Anesthesia Plan: Epidural   Post-op Pain Management:    Induction:   PONV Risk Score and Plan:   Airway Management Planned:   Additional Equipment:   Intra-op Plan:   Post-operative Plan:   Informed Consent: I have reviewed the patients History and Physical, chart, labs and discussed the procedure including the risks, benefits and alternatives for the proposed anesthesia with the patient or authorized representative who has indicated his/her understanding and acceptance.       Plan Discussed with:   Anesthesia Plan Comments: (38.6 wk G2P1  for LEA)  Anesthesia Quick Evaluation  

## 2020-08-07 NOTE — H&P (Signed)
OBSTETRIC ADMISSION HISTORY AND PHYSICAL  Ariel Hale is a 30 y.o. female G2P1001 with IUP at [redacted]w[redacted]d presenting for labor. She reports +FMs. No LOF, VB, blurry vision, headaches, peripheral edema, or RUQ pain. She plans on breastfeeding. She requests IUD for birth control.  Dating: By LMP --->  Estimated Date of Delivery: 08/15/20  Sono:    @[redacted]w[redacted]d , normal anatomy, ceph presentation, 3264g, 82%ile, EFW 7'1  Prenatal History/Complications: -GBS carrier -anxiety/depression on Zoloft -POTS -hx IUGR in previous pregnancy  Past Medical History: Past Medical History:  Diagnosis Date  . Chest pain   . Head ache   . SVD (spontaneous vaginal delivery) 07/25/2018  . Syncope   . Vasovagal syncope     Past Surgical History: Past Surgical History:  Procedure Laterality Date  . NO PAST SURGERIES      Obstetrical History: OB History    Gravida  2   Para  1   Term  1   Preterm      AB      Living  1     SAB      IAB      Ectopic      Multiple  0   Live Births  1           Social History: Social History   Socioeconomic History  . Marital status: Married    Spouse name: Not on file  . Number of children: Not on file  . Years of education: Not on file  . Highest education level: Not on file  Occupational History  . Not on file  Tobacco Use  . Smoking status: Never Smoker  . Smokeless tobacco: Never Used  Vaping Use  . Vaping Use: Never used  Substance and Sexual Activity  . Alcohol use: Not Currently    Alcohol/week: 1.0 standard drink    Types: 1 Standard drinks or equivalent per week  . Drug use: No  . Sexual activity: Yes    Partners: Male    Birth control/protection: None    Comment: no condoms  Other Topics Concern  . Not on file  Social History Narrative  . Not on file   Social Determinants of Health   Financial Resource Strain: Low Risk   . Difficulty of Paying Living Expenses: Not very hard  Food Insecurity: No Food Insecurity   . Worried About 07/27/2018 in the Last Year: Never true  . Ran Out of Food in the Last Year: Never true  Transportation Needs: No Transportation Needs  . Lack of Transportation (Medical): No  . Lack of Transportation (Non-Medical): No  Physical Activity: Insufficiently Active  . Days of Exercise per Week: 1 day  . Minutes of Exercise per Session: 60 min  Stress: No Stress Concern Present  . Feeling of Stress : Not at all  Social Connections: Socially Integrated  . Frequency of Communication with Friends and Family: More than three times a week  . Frequency of Social Gatherings with Friends and Family: More than three times a week  . Attends Religious Services: More than 4 times per year  . Active Member of Clubs or Organizations: Yes  . Attends Programme researcher, broadcasting/film/video Meetings: 1 to 4 times per year  . Marital Status: Married    Family History: Family History  Problem Relation Age of Onset  . Osteoporosis Maternal Grandmother   . Dementia Maternal Grandmother   . Diabetes Maternal Grandfather   . Heart disease Paternal Grandmother   .  Thyroid disease Maternal Aunt     Allergies: Allergies  Allergen Reactions  . Hydrocodone-Acetaminophen Nausea Only    Pt can tolerate Acetaminophen and requests not to take Percocet  . Vicodin [Hydrocodone-Acetaminophen] Nausea Only    Medications Prior to Admission  Medication Sig Dispense Refill Last Dose  . Acetaminophen (TYLENOL PO) Take by mouth.     Marland Kitchen aspirin 81 MG chewable tablet Chew 2 tablets (162 mg total) by mouth daily. (Patient taking differently: Chew 162 mg by mouth.) 60 tablet 8   . Prenatal Vit-Fe Fumarate-FA (PRENATAL PO) Take 1 tablet by mouth daily.     . sertraline (ZOLOFT) 25 MG tablet TAKE 1 TABLET (25 MG TOTAL) BY MOUTH DAILY. 90 tablet 3      Review of Systems:  All systems reviewed and negative except as stated in HPI  PE: Last menstrual period 11/09/2019, unknown if currently breastfeeding. General  appearance: alert, cooperative and moderate distress Lungs: regular rate and effort Heart: regular rate  Abdomen: soft, non-tender Extremities: Homans sign is negative, no sign of DVT Presentation: cephalic EFM: 135 bpm, mod variability, + accels, no decels Toco: q4 Dilation: 7 Effacement (%): 80 Station: -1 Exam by:: Marvel Plan, RN  Prenatal labs: ABO, Rh: O/Positive/-- (10/27 1626) Antibody: Negative (02/17 0834) Rubella: 2.31 (10/27 1626) RPR: Non Reactive (02/17 0834)  HBsAg: Negative (10/27 1626)  HIV: Non Reactive (02/17 0834)  GBS: Positive/-- (04/14 1600)  2 hr GTT normal  Prenatal Transfer Tool  Maternal Diabetes: No Genetic Screening: Normal Maternal Ultrasounds/Referrals: Normal Fetal Ultrasounds or other Referrals:  None Maternal Substance Abuse:  No Significant Maternal Medications:  Meds include: Zoloft Significant Maternal Lab Results: Group B Strep positive  No results found for this or any previous visit (from the past 24 hour(s)).  Patient Active Problem List   Diagnosis Date Noted  . Depression with anxiety 05/26/2020  . Encounter for supervision of normal pregnancy, antepartum 02/03/2020  . History of intrauterine growth restriction in prior pregnancy, currently pregnant 07/24/2018  . POTS (postural orthostatic tachycardia syndrome) 04/21/2015  . Palpitations 04/21/2015  . CIN I (cervical intraepithelial neoplasia I) 12/04/2013  . Major depression, recurrent (HCC) 11/27/2013  . Migraine without aura 11/27/2013    Assessment: Ariel Hale is a 30 y.o. G2P1001 at [redacted]w[redacted]d here for labor  1. Labor: active 2. FWB: Cat I 3. Pain: epidural 4. GBS: pos   Plan: Admit to LD Amp Expectant mngt Anticipate SVD  Donette Larry, CNM  08/07/2020, 9:34 AM

## 2020-08-08 ENCOUNTER — Telehealth: Payer: Self-pay | Admitting: Family Medicine

## 2020-08-08 LAB — RPR: RPR Ser Ql: NONREACTIVE

## 2020-08-08 NOTE — Lactation Note (Signed)
This note was copied from a baby's chart. Lactation Consultation Note  Patient Name: Ariel Hale SKAJG'O Date: 08/08/2020   Age:30 hours  Mom breastfeeding on arrival.  Reports pins and needles when first latches but does ease off after a little bit. Assisted with latching and just getting him in a little closer.  Mom reports it is still uncomfortable when he first latches but just a little better after a few.  Infant breastfed well with rythmic sucking and intermittent swallows.  Mom wearing shells and doing some prepumping.  Praised efforts urged her to call lactation as needed.  Maternal Data    Feeding    LATCH Score                    Lactation Tools Discussed/Used    Interventions    Discharge    Consult Status      Neomia Dear 08/08/2020, 6:02 PM

## 2020-08-08 NOTE — Telephone Encounter (Signed)
Vanna notified as instructed by telephone.  She states hospital told her 48 hours upon discharge.  Appointment scheduled 08/12/2020 at 3:40 pm.

## 2020-08-08 NOTE — Social Work (Signed)
[name] was referred for history of depression and anxiety.   * Referral screened out by Clinical Social Worker because none of the following criteria appear to apply:  ~ History of anxiety/depression during this pregnancy, or of post-partum depression following prior delivery. ~ Diagnosis of anxiety and/or depression within last 3 years. OR * [name]'s symptoms currently being treated with medication and/or therapy. CSW reviewed chart and notes [name] currently prescribed Zoloft 25mg .  Please contact the Clinical Social Worker if needs arise, by [facility] request, or if [name] scores greater than 9/yes to question 10 on Edinburgh Postpartum Depression Screen.  06-15-1977, LCSWA Clinical Social Work [facility] [employer]  [phone]

## 2020-08-08 NOTE — Lactation Note (Signed)
This note was copied from a baby's chart. Lactation Consultation Note  Patient Name: Ariel Hale Date: 08/08/2020 Reason for consult: Follow-up assessment Age:30 hours Infant just came back from circumcision. Mom reports breastfeeding is going much better with this one.  Nipples are sore mom reports but not painful. Dad brought lunch.  Praised breastfeeding.  Reviewed normal newborn behavior.  Urged hand expression and rub expressed mothers milk on nipples and air dry.  Urged mom to call lactation as needed.   Consult Status: Follow-up Date: 08/09/20 Follow-up type: In-patient    [name] [name] 08/08/2020, 1:04 PM

## 2020-08-08 NOTE — Telephone Encounter (Signed)
Timing usually recommended by PEDs at discharge but if no concerns/weight issues/bili issue/ lactation/feeding issues... in 1 week.

## 2020-08-08 NOTE — Telephone Encounter (Signed)
When would you like the first appointment scheduled?

## 2020-08-08 NOTE — Telephone Encounter (Signed)
Congratulations! Sure!

## 2020-08-08 NOTE — Telephone Encounter (Signed)
Pt called she just had her baby and wanted to know if Dr. Ermalene Searing would be the baby doctor.

## 2020-08-08 NOTE — Progress Notes (Addendum)
Post Partum Day 1 Subjective: Patient is doing well without complaints. Ambulating without difficulty. Voiding and passing flatus. Tolerating PO. Abdominal pain improved. Vaginal bleeding decreased.  Objective: Blood pressure 105/69, pulse 70, temperature 98.1 F (36.7 C), temperature source Oral, resp. rate 18, height 5\' 7"  (1.702 m), weight 91.6 kg, last menstrual period 11/09/2019, SpO2 100 %, unknown if currently breastfeeding.  Physical Exam:  General: alert, cooperative and no distress Lochia: appropriate Uterine Fundus: firm Incision: N/A DVT Evaluation: No evidence of DVT seen on physical exam.  Recent Labs    08/07/20 0936  HGB 11.8*  HCT 36.5    Assessment/Plan: Plan for discharge tomorrow, Breastfeeding, Circumcision prior to discharge and Contraception planned for 6 week PP visit    #Hx of depression and anxiety managed with zoloft.    LOS: 1 day   10/07/20 08/08/2020, 8:53 AM   Midwife attestation I have seen and examined this patient and agree with above documentation in the student's note.   Post Partum Day 1  Ariel Hale is a 30 y.o. [phone] s/p SVD.  Pt denies problems with ambulating, voiding or po intake. Pain is well controlled. Method of Feeding: breast  PE:  Gen: well appearing Heart: reg rate Lungs: normal WOB Fundus firm Ext: soft, no pain, no edema  Assessment: S/p SVD PPD #1  Plan for discharge: tomorrow  [id number], CNM 10:45 AM

## 2020-08-09 MED ORDER — COCONUT OIL OIL: 1.0000 "application " | TOPICAL_OIL | 0 refills | Status: DC | PRN

## 2020-08-09 MED ORDER — SIMETHICONE 80 MG PO CHEW: 80.0000 mg | CHEWABLE_TABLET | ORAL | 0 refills | Status: DC | PRN

## 2020-08-09 MED ORDER — IBUPROFEN 600 MG PO TABS: 600.0000 mg | ORAL_TABLET | Freq: Four times a day (QID) | ORAL | 0 refills | Status: DC

## 2020-08-09 NOTE — Telephone Encounter (Signed)
Pt called in andn stated that the nurse the baby needed to be seen tomorrow. Made the the appointment on 5/4 @ 10:20

## 2020-08-09 NOTE — Lactation Note (Addendum)
This note was copied from a baby's chart. Lactation Consultation Note  Patient Name: Ariel Hale FYBOF'B Date: 08/09/2020 Reason for consult: Follow-up assessment Age:30 hours P 2 - per mom 1st baby had challenges with latching and did not breastfeed long.  Baby awake and hungry when LC entered the room, mom changed a wet diaper before the feeding. LC updated the doc flow sheets. 4 documented stools and dad mentioned there was 5 . ( 3 out of the 5 were large stools)  Per mom breast are fuller and warmer today - milk coming in.  Mom positioned the baby on her own for cross cradle. Baby latched for short interval and released. LC worked with mom to obtain the depth and stressed the importance of working with the baby to open wide prior to latch and use compressions with latch to enhance flow. Baby still feeding at 28 mins with multiple swallows / increased with breast compressions. Latch score - 8  Mom has been using the breast shells between feedings and hand pump for pre - pumping to elongate the nipple / areola complex.  LC recommended prior to every feeding until the areola is consistently more compressible prior to latch. LC stressed the importance of STS latches until the baby is back to  See D/C teaching below.  Mom has the [facility] brochure with resources.    Maternal Data Has patient been taught Hand Expression?: Yes Does the patient have breastfeeding experience prior to this delivery?: Yes  Feeding    LATCH Score Latch: Grasps breast easily, tongue down, lips flanged, rhythmical sucking.  Audible Swallowing: Spontaneous and intermittent  Type of Nipple: Everted at rest and after stimulation (short shaft nipples and areola edema)  Comfort (Breast/Nipple): Filling, red/small blisters or bruises, mild/mod discomfort  Hold (Positioning): Assistance needed to correctly position infant at breast and maintain latch.  LATCH Score: 8   Lactation Tools Discussed/Used     Interventions Interventions: Breast feeding basics reviewed;Assisted with latch;Skin to skin;Breast massage;Hand express;Pre-pump if needed;Reverse pressure;Breast compression;Adjust position;Support pillows;Position options;Shells;Hand pump;Education  Discharge Pump: Manual  Consult Status Complete -       Matilde Sprang [facility] 08/09/2020, 9:38 AM

## 2020-08-10 ENCOUNTER — Ambulatory Visit: Payer: BC Managed Care – PPO | Admitting: Family Medicine

## 2020-08-12 ENCOUNTER — Encounter: Payer: BC Managed Care – PPO | Admitting: Obstetrics & Gynecology

## 2020-09-06 ENCOUNTER — Telehealth: Payer: Self-pay | Admitting: *Deleted

## 2020-09-06 NOTE — Telephone Encounter (Signed)
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 States patient gave birth 08/07/20 and she sent over several requests for Short term disability verification form and records request. States she sent to fax [phone]. States patient called also. States deadline is tomorrow and if she doesn't received information she can't approve patient claim. States her fax number is [phone].  Per chart is [facility] patient. Will route to that office. Kawana Hegel,RN

## 2020-09-13 ENCOUNTER — Other Ambulatory Visit: Payer: Self-pay

## 2020-09-13 ENCOUNTER — Ambulatory Visit (INDEPENDENT_AMBULATORY_CARE_PROVIDER_SITE_OTHER): Payer: BC Managed Care – PPO | Admitting: Women's Health

## 2020-09-13 ENCOUNTER — Other Ambulatory Visit (HOSPITAL_COMMUNITY)
Admission: RE | Admit: 2020-09-13 | Discharge: 2020-09-13 | Disposition: A | Discharge status: Home / Self Care | Payer: BC Managed Care – PPO | Source: Ambulatory Visit | Attending: Obstetrics & Gynecology | Admitting: Obstetrics & Gynecology

## 2020-09-13 ENCOUNTER — Encounter: Payer: Self-pay | Admitting: Women's Health

## 2020-09-13 DIAGNOSIS — Z124 Encounter for screening for malignant neoplasm of cervix: Secondary | ICD-10-CM

## 2020-09-13 DIAGNOSIS — Z30430 Encounter for insertion of intrauterine contraceptive device: Secondary | ICD-10-CM | POA: Diagnosis not present

## 2020-09-13 LAB — POCT URINE PREGNANCY: Preg Test, Ur: NEGATIVE

## 2020-09-13 MED ORDER — LEVONORGESTREL 20.1 MCG/DAY IU IUD
1.0000 | INTRAUTERINE_SYSTEM | Freq: Once | INTRAUTERINE | Status: AC
  Administered 2020-09-13: 1 via INTRAUTERINE

## 2020-09-13 NOTE — Progress Notes (Signed)
POSTPARTUM VISIT Patient name: Ariel Hale MRN [id number]  Date of birth: [birth year] Chief Complaint:   Postpartum Care  History of Present Illness:   Ariel Hale is a 30 y.o. G106P2002 Caucasian female being seen today for a postpartum visit. She is 5 weeks postpartum following a spontaneous vaginal delivery at 38.6 gestational weeks. IOL: no, for n/a. Anesthesia: epidural.  Laceration: none.  Complications: none. Inpatient contraception: no.   Pregnancy uncomplicated. Tobacco use: no. Substance use disorder: no. Last pap smear: 2019 and results were negative per pt report at [facility]. Next pap smear due: now No LMP recorded.  Postpartum course has been uncomplicated. Bleeding none. Bowel function is normal. Bladder function is normal. Urinary incontinence? no, fecal incontinence? no Patient is sexually active. Last sexual activity: ~3wks ago. Desired contraception: IUD. Patient does want a pregnancy in the future.  Desired family size is 3 children.   Upstream - 09/13/20 1339      Pregnancy Intention Screening   Does the patient want to become pregnant in the next year? No    Does the patient's partner want to become pregnant in the next year? No    Would the patient like to discuss contraceptive options today? No      Contraception Wrap Up   Current Method Female Condom    End Method --    Contraception Counseling Provided Yes          The pregnancy intention screening data noted above was reviewed. Potential methods of contraception were discussed. The patient elected to proceed with IUD or IUS.   Edinburgh Postpartum Depression Screening: negative, doing well on zoloft $RemoveB'25mg'MSytRXlW$   Edinburgh Postnatal Depression Scale - 09/13/20 1345      Edinburgh Postnatal Depression Scale:  In the Past 7 Days   I have been able to laugh and see the funny side of things. 0    I have looked forward with enjoyment to things. 0    I have blamed myself unnecessarily  when things went wrong. 0    I have been anxious or worried for no good reason. 0    I have felt scared or panicky for no good reason. 0    Things have been getting on top of me. 0    I have been so unhappy that I have had difficulty sleeping. 0    I have felt sad or miserable. 0    I have been so unhappy that I have been crying. 0    The thought of harming myself has occurred to me. 0    Edinburgh Postnatal Depression Scale Total 0           GAD 7 : Generalized Anxiety Score 05/26/2020 02/03/2020 12/25/2019  Nervous, Anxious, on Edge 0 0 0  Control/stop worrying 1 0 0  Worry too much - different things 1 0 0  Trouble relaxing 0 0 0  Restless 0 0 0  Easily annoyed or irritable 1 0 0  Afraid - awful might happen 0 0 0  Total GAD 7 Score 3 0 0  Anxiety Difficulty Not difficult at all - Not difficult at all     Baby's course has been uncomplicated. Baby is feeding by breast & bottle. Infant has a pediatrician/family doctor? Yes.  Childcare strategy if returning to work/school: n/a-working from home.  Pt has material needs met for her and baby: Yes.   Review of Systems:   Pertinent items are noted in HPI Denies  Abnormal vaginal discharge w/ itching/odor/irritation, headaches, visual changes, shortness of breath, chest pain, abdominal pain, severe nausea/vomiting, or problems with urination or bowel movements. Pertinent History Reviewed:  Reviewed past medical,surgical, obstetrical and family history.  Reviewed problem list, medications and allergies. OB History  Gravida Para Term Preterm AB Living  $Remov'2 2 2     2  'aJohSh$ SAB IAB Ectopic Multiple Live Births        0 2    # Outcome Date GA Lbr Len/2nd Weight Sex Delivery Anes PTL Lv  2 Term 08/07/20 [redacted]w[redacted]d 05:09 / 00:16 8 lb 6.8 oz (3.82 kg) M Vag-Spont EPI  LIV  1 Term 07/25/18 [redacted]w[redacted]d / 00:38 5 lb 7 oz (2.465 kg) M Vag-Spont EPI N LIV     Complications: IUGR (intrauterine growth retardation) of newborn   Physical Assessment:   Vitals:    09/13/20 1343  BP: 129/87  Weight: 176 lb (79.8 kg)  Body mass index is 27.57 kg/m.       Physical Examination:   General appearance: alert, well appearing, and in no distress  Mental status: alert, oriented to person, place, and time  Skin: warm & dry   Cardiovascular: normal heart rate noted   Respiratory: normal respiratory effort, no distress   Breasts: deferred, no complaints   Abdomen: soft, non-tender   Pelvic: VULVA: normal appearing vulva with no masses, tenderness or lesions, VAGINA: normal appearing vagina with normal color and discharge, no lesions, CERVIX: normal appearing cervix without discharge or lesions. Thin prep pap obtained: Yes  Rectal: not examined  Extremities: Edema: none        Results for orders placed or performed in visit on 09/13/20 (from the past 24 hour(s))  POCT urine pregnancy   Collection Time: 09/13/20  1:55 PM  Result Value Ref Range   Preg Test, Ur Negative Negative     IUD INSERTION The risks and benefits of the method and placement have been thouroughly reviewed with the patient and all questions were answered.  Specifically the patient is aware of failure rate of 04/998, expulsion of the IUD and of possible perforation.  The patient is aware of irregular bleeding due to the method and understands the incidence of irregular bleeding diminishes with time.  Signed copy of informed consent in chart.   Time out was performed.  A graves speculum was placed in the vagina.  The cervix was visualized, prepped using Betadine, and grasped with a single tooth tenaculum. The uterus was found to be retroflexed and it sounded to 9 cm.  Liletta  IUD placed per manufacturer's recommendations. The strings were trimmed to approximately 3 cm. The patient tolerated the procedure well.   Informal transvaginal sonogram was performed and the proper placement of the IUD was verified.  Chaperone: Barrister's clerk & Plan:  1) Postpartum exam 2) 5 wks s/p  spontaneous vaginal delivery 3) breast & bottle feeding 4) Depression screening 5) Liletta IUD insertion The patient was given post procedure instructions, including signs and symptoms of infection and to check for the strings after each menses or each month, and refraining from intercourse or anything in the vagina for 3 days.  Condoms for 2 weeks. She was given a care card with date IUD placed, and date IUD to be removed.  She is scheduled for a f/u appointment in 4 weeks.  Essential components of care per ACOG recommendations:  1.  Mood and well being:  . If positive depression screen, discussed  and plan developed.  . If using tobacco we discussed reduction/cessation and risk of relapse . If current substance abuse, we discussed and referral to local resources was offered.   2. Infant care and feeding:  . If breastfeeding, discussed returning to work, pumping, breastfeeding-associated pain, guidance regarding return to fertility while lactating if not using another method. If needed, patient was provided with a letter to be allowed to pump q 2-3hrs to support lactation in a private location with access to a refrigerator to store breastmilk.   . Recommended that all caregivers be immunized for flu, pertussis and other preventable communicable diseases . If pt does not have material needs met for her/baby, referred to local resources for help obtaining these.  3. Sexuality, contraception and birth spacing . Provided guidance regarding sexuality, management of dyspareunia, and resumption of intercourse . Discussed avoiding interpregnancy interval <68mths and recommended birth spacing of 18 months  4. Sleep and fatigue . Discussed coping options for fatigue and sleep disruption . Encouraged family/partner/community support of 4 hrs of uninterrupted sleep to help with mood and fatigue  5. Physical recovery  . If pt had a C/S, assessed incisional pain and providing guidance on normal vs  prolonged recovery . If pt had a laceration, perineal healing and pain reviewed.  . If urinary or fecal incontinence, discussed management and referred to PT or uro/gyn if indicated  . Patient is safe to resume physical activity. Discussed attainment of healthy weight.  6.  Chronic disease management . Discussed pregnancy complications if any, and their implications for future childbearing and long-term maternal health. . Review recommendations for prevention of recurrent pregnancy complications, such as 17 hydroxyprogesterone caproate to reduce risk for recurrent PTB not applicable, or aspirin to reduce risk of preeclampsia not applicable. . Pt had GDM: No. If yes, 2hr GTT scheduled: not applicable. . Reviewed medications and non-pregnant dosing including consideration of whether pt is breastfeeding using a reliable resource such as LactMed: not applicable . Referred for f/u w/ PCP or subspecialist providers as indicated: not applicable  7. Health maintenance . Mammogram at 30yo or earlier if indicated . Pap smears as indicated  Meds: No orders of the defined types were placed in this encounter.   Follow-up: Return in about 4 weeks (around 10/11/2020) for IUD f/u, with me, in person.   Orders Placed This Encounter  Procedures  . POCT urine pregnancy    [place], [facility] 09/13/2020 2:21 PM

## 2020-09-13 NOTE — Patient Instructions (Signed)
 Nothing in vagina for 3 days (no sex, douching, tampons, etc...)  Check your strings once a month to make sure you can feel them, if you are not able to please let us know  If you develop a fever of 100.4 or more in the next few weeks, or if you develop severe abdominal pain, please let us know  Use a backup method of birth control, such as condoms, for 2 weeks     Intrauterine Device Insertion, Care After This sheet gives you information about how to care for yourself after your procedure. Your health care provider may also give you more specific instructions. If you have problems or questions, contact your health care provider. What can I expect after the procedure? After the procedure, it is common to have:  Cramps and pain in the abdomen.  Bleeding. It may be light or heavy. This may last for a few days.  Lower back pain.  Dizziness.  Headaches.  Nausea. Follow these instructions at home:  Before resuming sexual activity, check to make sure that you can feel the IUD string or strings. You should be able to feel the end of the string below the opening of your cervix. If your IUD string is in place, you may resume sexual activity. ? If you had a hormonal IUD inserted more than 7 days after your most recent period started, you will need to use a backup method of birth control for 7 days after IUD insertion. Ask your health care provider whether this applies to you.  Continue to check that the IUD is still in place by feeling for the strings after every menstrual period, or once a month.  An IUD will not protect you from sexually transmitted infections (STIs). Use methods to prevent the exchange of body fluids between partners (barrier protection) every time you have sex. Barrier protection can be used during oral, vaginal, or anal sex. Commonly used barrier methods include: ? Female condom. ? Female condom. ? Dental dam.  Take over-the-counter and prescription medicines only as  told by your health care provider.  Keep all follow-up visits as told by your health care provider. This is important.   Contact a health care provider if:  You feel light-headed or weak.  You have any of the following problems with your IUD string or strings: ? The string bothers or hurts you or your sexual partner. ? You cannot feel the string. ? The string has gotten longer.  You can feel the IUD in your vagina.  You think you may be pregnant, or you miss your menstrual period.  You think you may have a sexually transmitted infection (STI). Get help right away if:  You have flu-like symptoms, such as tiredness (fatigue) and muscle aches.  You have a fever and chills.  You have bleeding that is heavier or lasts longer than a normal menstrual cycle.  You have abnormal or bad-smelling discharge from your vagina.  You develop abdominal pain that is new, is getting worse, or is not in the same area of earlier cramping and pain.  You have pain during sexual activity. Summary  After the procedure, it is common to have cramps and pain in the abdomen. It is also common to have light bleeding or heavier bleeding that is like your menstrual period.  Continue to check that the IUD is still in place by feeling for the strings after every menstrual period, or once a month.  Keep all follow-up visits as   told by your health care provider. This is important.  Contact your health care provider if you have problems with your IUD strings, such as the string getting longer or bothering you or your sexual partner. This information is not intended to replace advice given to you by your health care provider. Make sure you discuss any questions you have with your health care provider. Document Revised: 03/17/2019 Document Reviewed: 03/17/2019 Elsevier Patient Education  2021 Elsevier Inc.  

## 2020-09-13 NOTE — Addendum Note (Signed)
Addended by: Moss Mc on: 09/13/2020 04:18 PM   Modules accepted: Orders

## 2020-09-16 LAB — CYTOLOGY - PAP
Comment: NEGATIVE
Diagnosis: NEGATIVE
High risk HPV: NEGATIVE

## 2020-10-11 ENCOUNTER — Ambulatory Visit: Payer: BC Managed Care – PPO | Admitting: Women's Health

## 2021-05-12 ENCOUNTER — Ambulatory Visit: Payer: BC Managed Care – PPO | Admitting: Women's Health

## 2021-07-10 ENCOUNTER — Ambulatory Visit (INDEPENDENT_AMBULATORY_CARE_PROVIDER_SITE_OTHER): Payer: BC Managed Care – PPO | Admitting: Family

## 2021-07-10 ENCOUNTER — Encounter: Payer: Self-pay | Admitting: Family

## 2021-07-10 VITALS — BP 110/70 | HR 103 | Temp 99.5°F | Resp 16 | Ht 67.0 in | Wt 174.5 lb

## 2021-07-10 DIAGNOSIS — J0110 Acute frontal sinusitis, unspecified: Secondary | ICD-10-CM | POA: Insufficient documentation

## 2021-07-10 DIAGNOSIS — R509 Fever, unspecified: Secondary | ICD-10-CM | POA: Diagnosis not present

## 2021-07-10 DIAGNOSIS — J029 Acute pharyngitis, unspecified: Secondary | ICD-10-CM | POA: Insufficient documentation

## 2021-07-10 LAB — POC COVID19 BINAXNOW: SARS Coronavirus 2 Ag: NEGATIVE

## 2021-07-10 LAB — POCT RAPID STREP A (OFFICE): Rapid Strep A Screen: NEGATIVE

## 2021-07-10 MED ORDER — DOXYCYCLINE HYCLATE 100 MG PO TABS: 100.0000 mg | ORAL_TABLET | Freq: Two times a day (BID) | ORAL | 0 refills | Status: AC

## 2021-07-10 NOTE — Assessment & Plan Note (Signed)
Rapid strep negative in office ?Rapid strep covid negative in office ?

## 2021-07-10 NOTE — Assessment & Plan Note (Signed)
Rapid strep negative in office.  ?

## 2021-07-10 NOTE — Patient Instructions (Signed)
Start prescription  and take as prescribed.  ?Tylenol/ibuprofen ok for sinus pain as needed ?Increase oral fluids. Ok to continue with humidifers and hot steamy showers as discussed during visit. ? ?Recommend starting flonase once daily.  ? ?It was a pleasure speaking with you today, I hope you start feeling better soon. ? ?Regards,  ? ?Ayiden Milliman ? ?

## 2021-07-10 NOTE — Assessment & Plan Note (Signed)
Prescription given for doxycycline 100 mg po bid for ten days. Pt to continue tylenol/ibuprofen prn sinus pain. Continue with humidifier prn and steam showers recommended as well. instructed If no symptom improvement in 48 hours please f/u ? ?

## 2021-07-10 NOTE — Progress Notes (Signed)
? ?Established Patient Office Visit ? ?Subjective:  ?Patient ID: Ariel Hale, female    DOB: [birth year]  Age: 31 y.o. MRN: [id number] ? ?CC:  ?Chief Complaint  ?Patient presents with  ? Sinus Problem  ?  Started last Monday but it got worse on Tuesday. Covid test neg this am.  ? ? ?HPI ?Davion Meara Marxen is here today with concerns.  ? ?Seven days ago started with symptoms. Covid tested this am and was negative.  ?Sinus pressure sinus tenderness, headache posterior eye (hard to look at screen) sore throat which has since resolved, also with productive cough. Left ear pain.  ? ?Taking otc cold and flu with only mild relief. Goodies no relief.  ? ?No wheezing no sob no fever no chills.  ? ?Past Medical History:  ?Diagnosis Date  ? Chest pain   ? Head ache   ? SVD (spontaneous vaginal delivery) 07/25/2018  ? Syncope   ? Vasovagal syncope   ? ? ?Past Surgical History:  ?Procedure Laterality Date  ? NO PAST SURGERIES    ? ? ?Family History  ?Problem Relation Age of Onset  ? Osteoporosis Maternal Grandmother   ? Dementia Maternal Grandmother   ? Diabetes Maternal Grandfather   ? Heart disease Paternal Grandmother   ? Thyroid disease Maternal Aunt   ? ? ?Social History  ? ?Socioeconomic History  ? Marital status: Married  ?  Spouse name: Not on file  ? Number of children: Not on file  ? Years of education: Not on file  ? Highest education level: Not on file  ?Occupational History  ? Not on file  ?Tobacco Use  ? Smoking status: Never  ? Smokeless tobacco: Never  ?Vaping Use  ? Vaping Use: Never used  ?Substance and Sexual Activity  ? Alcohol use: Not Currently  ?  Alcohol/week: 1.0 standard drink  ?  Types: 1 Standard drinks or equivalent per week  ? Drug use: No  ? Sexual activity: Yes  ?  Partners: Male  ?  Birth control/protection: None  ?  Comment: no condoms  ?Other Topics Concern  ? Not on file  ?Social History Narrative  ? Not on file  ? ?Social Determinants of Health  ? ?Financial Resource Strain: Low  Risk   ? Difficulty of Paying Living Expenses: Not very hard  ?Food Insecurity: No Food Insecurity  ? Worried About Programme researcher, broadcasting/film/video in the Last Year: Never true  ? Ran Out of Food in the Last Year: Never true  ?Transportation Needs: No Transportation Needs  ? Lack of Transportation (Medical): No  ? Lack of Transportation (Non-Medical): No  ?Physical Activity: Insufficiently Active  ? Days of Exercise per Week: 2 days  ? Minutes of Exercise per Session: 60 min  ?Stress: No Stress Concern Present  ? Feeling of Stress : Not at all  ?Social Connections: Socially Integrated  ? Frequency of Communication with Friends and Family: More than three times a week  ? Frequency of Social Gatherings with Friends and Family: More than three times a week  ? Attends Religious Services: More than 4 times per year  ? Active Member of Clubs or Organizations: Yes  ? Attends Banker Meetings: More than 4 times per year  ? Marital Status: Married  ?Intimate Partner Violence: Not At Risk  ? Fear of Current or Ex-Partner: No  ? Emotionally Abused: No  ? Physically Abused: No  ? Sexually Abused: No  ? ? ?Outpatient Medications  Prior to Visit  ?Medication Sig Dispense Refill  ? Acetaminophen (TYLENOL PO) Take by mouth.    ? ibuprofen (ADVIL) 600 MG tablet Take 1 tablet (600 mg total) by mouth every 6 (six) hours. (Patient not taking: Reported on 07/10/2021) 30 tablet 0  ? coconut oil OIL Apply 1 application topically as needed. (Patient not taking: Reported on 07/10/2021)  0  ? Prenatal Vit-Fe Fumarate-FA (PRENATAL PO) Take 1 tablet by mouth daily. (Patient not taking: Reported on 07/10/2021)    ? sertraline (ZOLOFT) 25 MG tablet TAKE 1 TABLET (25 MG TOTAL) BY MOUTH DAILY. (Patient not taking: Reported on 07/10/2021) 90 tablet 3  ? simethicone (MYLICON) 80 MG chewable tablet Chew 1 tablet (80 mg total) by mouth as needed for flatulence. (Patient not taking: Reported on 07/10/2021) 30 tablet 0  ? ?No facility-administered medications  prior to visit.  ? ? ?Allergies  ?Allergen Reactions  ? Hydrocodone-Acetaminophen Nausea Only  ?  Pt can tolerate Acetaminophen and requests not to take Percocet  ? Penicillins Nausea And Vomiting  ? Vicodin [Hydrocodone-Acetaminophen] Nausea Only  ? ? ?ROS ?Review of Systems  ?Constitutional:  Positive for chills and fever (ast night 99.1).  ?HENT:  Positive for congestion, ear pain (left), sinus pressure, sinus pain and sore throat.   ?Respiratory:  Positive for cough (productive). Negative for shortness of breath and wheezing.   ?Cardiovascular:  Negative for chest pain and palpitations.  ?Musculoskeletal:  Positive for arthralgias (body aches).  ?Neurological:  Positive for headaches.  ? ?  ?Objective:  ?  ?Physical Exam ?Vitals reviewed.  ?Constitutional:   ?   General: She is not in acute distress. ?   Appearance: Normal appearance. She is normal weight. She is not ill-appearing, toxic-appearing or diaphoretic.  ?HENT:  ?   Head: Normocephalic.  ?   Right Ear: Tympanic membrane normal.  ?   Left Ear: Tympanic membrane normal.  ?   Nose:  ?   Right Turbinates: Not enlarged.  ?   Left Turbinates: Not enlarged.  ?   Right Sinus: Maxillary sinus tenderness and frontal sinus tenderness present.  ?   Left Sinus: Maxillary sinus tenderness and frontal sinus tenderness present.  ?   Mouth/Throat:  ?   Mouth: Mucous membranes are moist.  ?   Pharynx: Posterior oropharyngeal erythema present. No pharyngeal swelling.  ?   Tonsils: No tonsillar exudate or tonsillar abscesses. 0 on the right. 0 on the left.  ?Eyes:  ?   Pupils: Pupils are equal, round, and reactive to light.  ?Cardiovascular:  ?   Rate and Rhythm: Normal rate and regular rhythm.  ?Pulmonary:  ?   Effort: Pulmonary effort is normal.  ?   Breath sounds: Normal breath sounds.  ?Musculoskeletal:  ?   Cervical back: Normal range of motion.  ?Neurological:  ?   Mental Status: She is alert.  ? ? ?BP 110/70   Pulse (!) 103   Temp 99.5 ?F (37.5 ?C)   Resp 16    Ht 5\' 7"  (1.702 m)   Wt 174 lb 8 oz (79.2 kg)   SpO2 98%   BMI 27.33 kg/m?  ?Wt Readings from Last 3 Encounters:  ?07/10/21 174 lb 8 oz (79.2 kg)  ?09/13/20 176 lb (79.8 kg)  ?08/07/20 202 lb (91.6 kg)  ? ? ? ?Health Maintenance Due  ?Topic Date Due  ? COVID-19 Vaccine (1) Never done  ? TETANUS/TDAP  01/25/2020  ? ? ?There are no preventive care reminders  to display for this patient. ? ?Lab Results  ?Component Value Date  ? TSH 1.09 01/06/2019  ? ?Lab Results  ?Component Value Date  ? WBC 8.0 08/07/2020  ? HGB 11.8 (L) 08/07/2020  ? HCT 36.5 08/07/2020  ? MCV 88.4 08/07/2020  ? PLT 232 08/07/2020  ? ?Lab Results  ?Component Value Date  ? NA 138 01/06/2019  ? K 4.5 01/06/2019  ? CO2 29 01/06/2019  ? GLUCOSE 91 01/06/2019  ? BUN 13 01/06/2019  ? CREATININE 0.88 01/06/2019  ? BILITOT 0.4 01/06/2019  ? ALKPHOS 51 01/06/2019  ? AST 16 01/06/2019  ? ALT 18 01/06/2019  ? PROT 7.2 01/06/2019  ? ALBUMIN 4.3 01/06/2019  ? CALCIUM 9.9 01/06/2019  ? ANIONGAP 10 07/24/2018  ? GFR 76.49 01/06/2019  ? ?No results found for: HGBA1C ? ?  ?Assessment & Plan:  ? ?Problem List Items Addressed This Visit   ? ?  ? Respiratory  ? Acute non-recurrent frontal sinusitis  ?  Prescription given for doxycycline 100 mg po bid for ten days. Pt to continue tylenol/ibuprofen prn sinus pain. Continue with humidifier prn and steam showers recommended as well. instructed If no symptom improvement in 48 hours please f/u ? ?  ?  ? Relevant Medications  ? doxycycline (VIBRA-TABS) 100 MG tablet  ?  ? Other  ? Fever and chills  ?  Rapid strep negative in office ?Rapid strep covid negative in office ?  ?  ? Relevant Orders  ? POC COVID-19 BinaxNow (Completed)  ? Sore throat - Primary  ?  Rapid strep negative in office.  ?  ?  ? Relevant Orders  ? POCT rapid strep A (Completed)  ? ? ?Meds ordered this encounter  ?Medications  ? doxycycline (VIBRA-TABS) 100 MG tablet  ?  Sig: Take 1 tablet (100 mg total) by mouth 2 (two) times daily for 10 days.  ?   Dispense:  20 tablet  ?  Refill:  0  ?  Order Specific Question:   Supervising Provider  ?  Answer:   BEDSOLE, AMY E [[id number]]  ? ? ?Follow-up: Return if symptoms worsen or fail to improve with pcp.  ? ? ?Mort Sawyersabitha Romuald Mccaslin, F

## 2023-05-15 ENCOUNTER — Telehealth: Payer: Self-pay | Admitting: *Deleted

## 2023-05-15 NOTE — Telephone Encounter (Signed)
 Copied from CRM [id number]. Topic: Appointments - Appointment Scheduling >> May 15, 2023  2:51 PM Chantha C wrote: Patient/patient representative is calling to schedule an appointment. Refer to attachments for appointment information. Patient want to have an appointment with Dr. Avelina for Mental health issues check. Patient states she cannot get through conversations without crying. Patient denies self harm, suicidal thoughts, not in harm ful/unsafe environment. Scheduled an appointment for 05/16/23 at 2 pm. FYI

## 2023-05-16 ENCOUNTER — Encounter: Payer: Self-pay | Admitting: Family Medicine

## 2023-05-16 ENCOUNTER — Ambulatory Visit: Payer: BC Managed Care – PPO | Admitting: Family Medicine

## 2023-05-16 VITALS — BP 110/82 | HR 88 | Temp 98.2°F | Ht 67.0 in | Wt 181.2 lb

## 2023-05-16 DIAGNOSIS — F411 Generalized anxiety disorder: Secondary | ICD-10-CM | POA: Diagnosis not present

## 2023-05-16 DIAGNOSIS — F331 Major depressive disorder, recurrent, moderate: Secondary | ICD-10-CM

## 2023-05-16 MED ORDER — SERTRALINE HCL 50 MG PO TABS: 50.0000 mg | ORAL_TABLET | Freq: Every day | ORAL | 3 refills | Status: DC

## 2023-05-16 MED ORDER — HYDROXYZINE HCL 10 MG PO TABS: 10.0000 mg | ORAL_TABLET | Freq: Three times a day (TID) | ORAL | 0 refills | Status: DC | PRN

## 2023-05-16 NOTE — Assessment & Plan Note (Signed)
 Acute, recurrent, poor control Patient unable to continue work at this time.  She cannot complete daily tasks due to concentration issues, she is frequently tearful and cannot deal with clients on the phone. We will plan FMLA and till follow-up on February 20. In the meantime she will start sertraline  50 mg p.o. nightly.  She can use Atarax  10 mg p.o. 3 times daily as needed anxiety. Encouraged her to speak with her husband about sharing home care and child care tasks. Will place referral to counselor and for her to consider marriage counseling.  Close follow-up in 2 weeks for reassessment.

## 2023-05-16 NOTE — Progress Notes (Signed)
 Patient ID: Ariel Hale, female    DOB: 1990-08-26, 33 y.o.   MRN: [id number]  This visit was conducted in person.  BP 110/82 (BP Location: Left Arm, Patient Position: Sitting, Cuff Size: Normal)   Pulse 88   Temp 98.2 F (36.8 C) (Temporal)   Ht 5' 7 (1.702 m)   Wt 181 lb 3.2 oz (82.2 kg)   LMP 05/02/2023 (Approximate)   SpO2 98%   BMI 28.38 kg/m    CC:  Chief Complaint  Patient presents with   Depression   Anxiety    Subjective:   HPI: Ariel Hale is a 33 y.o. female presenting on 05/16/2023 for Depression and Anxiety   Patient has history of recurrent major depression but had been doing well until last several months.   She struggled to  enjoy the holidays.  She reports some increased stress.  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 struggling to deal with working and being a mother. She is feeling overwhelmed.  She has a desire to run away, trying to isolate herself in her room.  She was treated with sertraline  in the past. Saw a counselor I the past.. she does not feel that this is helpful.  No issue sleeping.. she is wanting to sleep all the time.     She feels she cannot complete daily task, cannot concentrate,  tearful and cannot deal with clients.  Anxiety causes her difficult to pivot and handle tasks.     05/16/2023    2:01 PM 05/26/2020    9:00 AM 02/03/2020    3:23 PM  Depression screen PHQ 2/9  Decreased Interest 2 0 0  Down, Depressed, Hopeless 3 1 0  PHQ - 2 Score 5 1 0  Altered sleeping 2 0 0  Tired, decreased energy 3 1 2   Change in appetite 1 0 0  Feeling bad or failure about yourself  3 0 0  Trouble concentrating 2 0 0  Moving slowly or fidgety/restless 1 0 0  Suicidal thoughts 0 0 0  PHQ-9 Score 17 2 2   Difficult doing work/chores Extremely dIfficult Not difficult at all        05/16/2023    2:02 PM 05/26/2020    9:02 AM 02/03/2020    3:23 PM 12/25/2019   12:07 PM  GAD 7 : Generalized Anxiety  Score  Nervous, Anxious, on Edge 2 0 0 0  Control/stop worrying 2 1 0 0  Worry too much - different things 2 1 0 0  Trouble relaxing 3 0 0 0  Restless 2 0 0 0  Easily annoyed or irritable 3 1 0 0  Afraid - awful might happen 2 0 0 0  Total GAD 7 Score 16 3 0 0  Anxiety Difficulty Extremely difficult Not difficult at all  Not difficult at all          Relevant past medical, surgical, family and social history reviewed and updated as indicated. Interim medical history since our last visit reviewed. Allergies and medications reviewed and updated. Outpatient Medications Prior to Visit  Medication Sig Dispense Refill   levonorgestrel  (MIRENA ) 20 MCG/DAY IUD 1 each by Intrauterine route once.     ibuprofen  (ADVIL ) 600 MG tablet Take 1 tablet (600 mg total) by mouth every 6 (six) hours. (Patient not taking: Reported on 07/10/2021) 30 tablet 0   No facility-administered medications prior to visit.     Per HPI unless specifically indicated  in ROS section below Review of Systems  Constitutional:  Negative for fatigue and fever.  HENT:  Negative for congestion.   Eyes:  Negative for pain.  Respiratory:  Negative for cough and shortness of breath.   Cardiovascular:  Negative for chest pain, palpitations and leg swelling.  Gastrointestinal:  Negative for abdominal pain.  Genitourinary:  Negative for dysuria and vaginal bleeding.  Musculoskeletal:  Negative for back pain.  Neurological:  Negative for syncope, light-headedness and headaches.  Psychiatric/Behavioral:  Positive for agitation, dysphoric mood and sleep disturbance. Negative for suicidal ideas. The patient is nervous/anxious.    Objective:  BP 110/82 (BP Location: Left Arm, Patient Position: Sitting, Cuff Size: Normal)   Pulse 88   Temp 98.2 F (36.8 C) (Temporal)   Ht 5' 7 (1.702 m)   Wt 181 lb 3.2 oz (82.2 kg)   LMP 05/02/2023 (Approximate)   SpO2 98%   BMI 28.38 kg/m   Wt Readings from Last 3 Encounters:  05/16/23  181 lb 3.2 oz (82.2 kg)  07/10/21 174 lb 8 oz (79.2 kg)  09/13/20 176 lb (79.8 kg)      Physical Exam Constitutional:      General: She is not in acute distress.    Appearance: Normal appearance. She is well-developed. She is not ill-appearing or toxic-appearing.  HENT:     Head: Normocephalic.     Right Ear: Hearing, tympanic membrane, ear canal and external ear normal. Tympanic membrane is not erythematous, retracted or bulging.     Left Ear: Hearing, tympanic membrane, ear canal and external ear normal. Tympanic membrane is not erythematous, retracted or bulging.     Nose: No mucosal edema or rhinorrhea.     Right Sinus: No maxillary sinus tenderness or frontal sinus tenderness.     Left Sinus: No maxillary sinus tenderness or frontal sinus tenderness.     Mouth/Throat:     Pharynx: Uvula midline.  Eyes:     General: Lids are normal. Lids are everted, no foreign bodies appreciated.     Conjunctiva/sclera: Conjunctivae normal.     Pupils: Pupils are equal, round, and reactive to light.  Neck:     Thyroid : No thyroid  mass or thyromegaly.     Vascular: No carotid bruit.     Trachea: Trachea normal.  Cardiovascular:     Rate and Rhythm: Normal rate and regular rhythm.     Pulses: Normal pulses.     Heart sounds: Normal heart sounds, S1 normal and S2 normal. No murmur heard.    No friction rub. No gallop.  Pulmonary:     Effort: Pulmonary effort is normal. No tachypnea or respiratory distress.     Breath sounds: Normal breath sounds. No decreased breath sounds, wheezing, rhonchi or rales.  Abdominal:     General: Bowel sounds are normal.     Palpations: Abdomen is soft.     Tenderness: There is no abdominal tenderness.  Musculoskeletal:     Cervical back: Normal range of motion and neck supple.  Skin:    General: Skin is warm and dry.     Findings: No rash.  Neurological:     Mental Status: She is alert.  Psychiatric:        Mood and Affect: Mood is depressed. Affect is  tearful.        Speech: Speech normal.        Behavior: Behavior normal. Behavior is cooperative.        Thought Content: Thought content normal.  Cognition and Memory: Cognition and memory normal.        Judgment: Judgment normal.       Results for orders placed or performed in visit on 07/10/21  POCT rapid strep A   Collection Time: 07/10/21 11:24 AM  Result Value Ref Range   Rapid Strep A Screen Negative Negative  POC COVID-19 BinaxNow   Collection Time: 07/10/21 11:50 AM  Result Value Ref Range   SARS Coronavirus 2 Ag Negative Negative    Assessment and Plan  Moderate episode of recurrent major depressive disorder (HCC) Assessment & Plan: Acute, recurrent, poor control Patient unable to continue work at this time.  She cannot complete daily tasks due to concentration issues, she is frequently tearful and cannot deal with clients on the phone. We will plan FMLA and till follow-up on February 20. In the meantime she will start sertraline  50 mg p.o. nightly.  She can use Atarax  10 mg p.o. 3 times daily as needed anxiety. Encouraged her to speak with her husband about sharing home care and child care tasks. Will place referral to counselor and for her to consider marriage counseling.  Close follow-up in 2 weeks for reassessment.  Orders: -     Ambulatory referral to Psychology  GAD (generalized anxiety disorder) -     Ambulatory referral to Psychology  Other orders -     Sertraline  HCl; Take 1 tablet (50 mg total) by mouth daily.  Dispense: 30 tablet; Refill: 3 -     hydrOXYzine  HCl; Take 1 tablet (10 mg total) by mouth 3 (three) times daily as needed.  Dispense: 30 tablet; Refill: 0    Return in about 2 weeks (around 05/30/2023) for follow up mood.SABRA Greig Ring, MD

## 2023-05-17 ENCOUNTER — Encounter: Payer: Self-pay | Admitting: Family Medicine

## 2023-05-17 ENCOUNTER — Telehealth: Payer: Self-pay

## 2023-05-17 NOTE — Telephone Encounter (Signed)
 FMLA paperwork printed and placed in Dr. Millie Alm office in box to complete.

## 2023-05-17 NOTE — Telephone Encounter (Signed)
 FMLA paperwork in Dr. Millie Alm office in box to complete.

## 2023-05-17 NOTE — Telephone Encounter (Signed)
 Copied from CRM [id number]. Topic: General - Other >> May 17, 2023  2:11 PM Russell PARAS wrote: Reason for CRM: Patient was calling to confirm provider had received FMLA paperwork. Contacted [employer] to advise on timeframe for processing of paperwork and next steps. Patient would like to know if it is possible for the completed forms to be sent to her HR's email or mailing address. CB# [phone], or she can be contacted through MyChart

## 2023-05-21 DIAGNOSIS — Z0279 Encounter for issue of other medical certificate: Secondary | ICD-10-CM

## 2023-05-21 NOTE — Telephone Encounter (Signed)
Completed and in outbox.

## 2023-05-22 NOTE — Telephone Encounter (Signed)
Completed FMLA paperwork faxed to [phone].

## 2023-05-22 NOTE — Telephone Encounter (Signed)
Patient completed ppwk gave back to CMA

## 2023-05-22 NOTE — Telephone Encounter (Signed)
Called Ariel Hale to let her know Dr. Ermalene Searing has completed her FMLA paperwork but there is 2 pages she needs to complete before I can fax them in.  Patient will stop by office to complete.

## 2023-05-24 ENCOUNTER — Telehealth: Payer: Self-pay | Admitting: Family Medicine

## 2023-05-24 NOTE — Telephone Encounter (Signed)
Copied from CRM [id number]. Topic: General - Other >> May 24, 2023 10:06 AM Gurney Maxin H wrote: Reason for CRM: Shanda Bumps calling to check status of FMLA paperwork for patient, advised caller paperwork was faxed on 2/12. Shanda Bumps would like to know if paperwork can be faxed directly to her.  Shanda Bumps [phone] Ext [phone] fax

## 2023-05-24 NOTE — Telephone Encounter (Signed)
Paperwork re-faxed to [place] as requested at [phone].

## 2023-05-30 ENCOUNTER — Ambulatory Visit: Payer: BC Managed Care – PPO | Admitting: Family Medicine

## 2023-05-31 ENCOUNTER — Telehealth: Payer: Self-pay

## 2023-05-31 ENCOUNTER — Ambulatory Visit (INDEPENDENT_AMBULATORY_CARE_PROVIDER_SITE_OTHER): Payer: BC Managed Care – PPO | Admitting: Family Medicine

## 2023-05-31 ENCOUNTER — Encounter: Payer: Self-pay | Admitting: Family Medicine

## 2023-05-31 VITALS — BP 120/86 | HR 104 | Temp 98.3°F | Ht 67.0 in | Wt 181.0 lb

## 2023-05-31 DIAGNOSIS — F331 Major depressive disorder, recurrent, moderate: Secondary | ICD-10-CM

## 2023-05-31 DIAGNOSIS — F411 Generalized anxiety disorder: Secondary | ICD-10-CM | POA: Diagnosis not present

## 2023-05-31 NOTE — Progress Notes (Signed)
 Patient ID: Ariel Hale, female    DOB: 06-29-1990, 33 y.o.   MRN: [id number]  This visit was conducted in person.  BP 120/86 (BP Location: Right Arm, Patient Position: Sitting, Cuff Size: Normal)   Pulse (!) 104   Temp 98.3 F (36.8 C) (Temporal)   Ht 5\' 7"  (1.702 m)   Wt 181 lb (82.1 kg)   LMP 05/31/2023 (Approximate)   SpO2 99%   BMI 28.35 kg/m    CC:  Chief Complaint  Patient presents with   Depression   Anxiety    Subjective:   HPI: Rahcel Hale is a 33 y.o. female presenting on 05/31/2023 for Depression and Anxiety  Major depression of disorder, moderate, recurrent and generalized anxiety disorder: Acute worsening noted over the last few months.  Last office visit May 16, 2023 she was taken out of work given unable to perform her work duties.  She was started on sertraline 50 mg nightly.  She was given Atarax 10 mg p.o. 3 times daily as needed anxiety. Referral placed for counseling and encouraged her to consider marriage counseling.  She presents today for 2-week reassessment Unfortunately it appears her symptoms have worsened, not improved. See below questionnaires for specific details.  Has not yet seen counselor.. first visit 06/13/2023.    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  Cannot problem solve. Trouble relaxing. Having panic attacks.      06/14/2023   11:25 AM 05/31/2023   11:33 AM 05/16/2023    2:02 PM 05/26/2020    9:02 AM  GAD 7 : Generalized Anxiety Score  Nervous, Anxious, on Edge 3 3 2  0  Control/stop worrying 2 2 2 1   Worry too much - different things 2 2 2 1   Trouble relaxing 3 3 3  0  Restless 3 2 2  0  Easily annoyed or irritable 3 3 3 1   Afraid - awful might happen 2 2 2  0  Total GAD 7 Score 18 17 16 3   Anxiety Difficulty Extremely difficult Extremely difficult Extremely difficult Not difficult at all      06/14/2023   11:25 AM 05/31/2023   11:33 AM  05/16/2023    2:01 PM  Depression screen PHQ 2/9  Decreased Interest 2 2 2   Down, Depressed, Hopeless 3 3 3   PHQ - 2 Score 5 5 5   Altered sleeping 2 3 2   Tired, decreased energy 2 3 3   Change in appetite 3 2 1   Feeling bad or failure about yourself  2 3 3   Trouble concentrating 3 2 2   Moving slowly or fidgety/restless 3 2 1   Suicidal thoughts 0 0 0  PHQ-9 Score 20 20 17   Difficult doing work/chores Extremely dIfficult Extremely dIfficult Extremely dIfficult         Relevant past medical, surgical, family and social history reviewed and updated as indicated. Interim medical history since our last visit reviewed. Allergies and medications reviewed and updated. Outpatient Medications Prior to Visit  Medication Sig Dispense Refill   hydrOXYzine (ATARAX) 10 MG tablet Take 1 tablet (10 mg total) by mouth 3 (three) times daily as needed. 30 tablet 0   levonorgestrel (MIRENA) 20 MCG/DAY IUD 1 each by Intrauterine route once.     sertraline (ZOLOFT) 50 MG tablet Take 1 tablet (50 mg total) by mouth daily. 30 tablet 3   No facility-administered medications prior to visit.     Per HPI  unless specifically indicated in ROS section below Review of Systems  Constitutional:  Negative for fatigue and fever.  HENT:  Negative for congestion.   Eyes:  Negative for pain.  Respiratory:  Negative for cough and shortness of breath.   Cardiovascular:  Negative for chest pain, palpitations and leg swelling.  Gastrointestinal:  Negative for abdominal pain.  Genitourinary:  Negative for dysuria and vaginal bleeding.  Musculoskeletal:  Negative for back pain.  Neurological:  Negative for syncope, light-headedness and headaches.  Psychiatric/Behavioral:  Positive for decreased concentration, dysphoric mood and sleep disturbance. Negative for suicidal ideas. The patient is nervous/anxious.    Objective:  BP 120/86 (BP Location: Right Arm, Patient Position: Sitting, Cuff Size: Normal)   Pulse (!) 104    Temp 98.3 F (36.8 C) (Temporal)   Ht 5\' 7"  (1.702 m)   Wt 181 lb (82.1 kg)   LMP 05/31/2023 (Approximate)   SpO2 99%   BMI 28.35 kg/m   Wt Readings from Last 3 Encounters:  06/14/23 179 lb 2 oz (81.3 kg)  05/31/23 181 lb (82.1 kg)  05/16/23 181 lb 3.2 oz (82.2 kg)      Physical Exam Constitutional:      General: She is not in acute distress.    Appearance: Normal appearance. She is well-developed. She is not ill-appearing or toxic-appearing.  HENT:     Head: Normocephalic.     Right Ear: Hearing, tympanic membrane, ear canal and external ear normal. Tympanic membrane is not erythematous, retracted or bulging.     Left Ear: Hearing, tympanic membrane, ear canal and external ear normal. Tympanic membrane is not erythematous, retracted or bulging.     Nose: No mucosal edema or rhinorrhea.     Right Sinus: No maxillary sinus tenderness or frontal sinus tenderness.     Left Sinus: No maxillary sinus tenderness or frontal sinus tenderness.     Mouth/Throat:     Pharynx: Uvula midline.  Eyes:     General: Lids are normal. Lids are everted, no foreign bodies appreciated.     Conjunctiva/sclera: Conjunctivae normal.     Pupils: Pupils are equal, round, and reactive to light.  Neck:     Thyroid: No thyroid mass or thyromegaly.     Vascular: No carotid bruit.     Trachea: Trachea normal.  Cardiovascular:     Rate and Rhythm: Normal rate and regular rhythm.     Pulses: Normal pulses.     Heart sounds: Normal heart sounds, S1 normal and S2 normal. No murmur heard.    No friction rub. No gallop.  Pulmonary:     Effort: Pulmonary effort is normal. No tachypnea or respiratory distress.     Breath sounds: Normal breath sounds. No decreased breath sounds, wheezing, rhonchi or rales.  Abdominal:     General: Bowel sounds are normal.     Palpations: Abdomen is soft.     Tenderness: There is no abdominal tenderness.  Musculoskeletal:     Cervical back: Normal range of motion and neck  supple.  Skin:    General: Skin is warm and dry.     Findings: No rash.  Neurological:     Mental Status: She is alert.  Psychiatric:        Mood and Affect: Mood is depressed. Mood is not anxious. Affect is tearful.        Speech: Speech normal.        Behavior: Behavior normal. Behavior is cooperative.  Thought Content: Thought content normal. Thought content does not include homicidal or suicidal ideation. Thought content does not include homicidal or suicidal plan.        Cognition and Memory: Cognition normal.        Judgment: Judgment normal.       Results for orders placed or performed in visit on 07/10/21  POCT rapid strep A   Collection Time: 07/10/21 11:24 AM  Result Value Ref Range   Rapid Strep A Screen Negative Negative  POC COVID-19 BinaxNow   Collection Time: 07/10/21 11:50 AM  Result Value Ref Range   SARS Coronavirus 2 Ag Negative Negative    Assessment and Plan  Moderate episode of recurrent major depressive disorder (HCC) Assessment & Plan: Acute, inadequate control despite sertraline 50 mg daily x 2 weeks.  We will give the sertraline more time to begin working and she will continue working on setting up with a Veterinary surgeon.  Her first visit is June 13, 2023.  She denies suicidal ideation and states that although she continues to be able to care for her children, She is unable to perform her job tasks at this time.  Her FMLA is until March 21.  She will return a day prior for evaluation to determine if she will be ready at that time or if we need to extend the [facility].   GAD (generalized anxiety disorder) Assessment & Plan: Acute, inadequate control She has noted some temporary improvement with overwhelming panicky symptoms when she takes the Atarax as needed.  She denies any side effects but states her husband says it makes her zombielike.     Return in about 2 weeks (around 06/14/2023) for follow up mood, FMLA out until 06/28/2023.   Kerby Nora, MD

## 2023-05-31 NOTE — Telephone Encounter (Signed)
Copied from CRM [id number]. Topic: General - Other >> May 31, 2023 12:54 PM Truddie Crumble wrote: Reason for CRM: Shanda Bumps from [employer] called stating she is going to send a form over for the doctor to fill out and she would also need an updated work note faxed to her. Shanda Bumps stated there is no need to do the whole FMLA packet over CB [phone]

## 2023-05-31 NOTE — Telephone Encounter (Signed)
New completed FMLA faxed to [facility] at [phone].

## 2023-05-31 NOTE — Telephone Encounter (Signed)
I already updated the [facility] packet. Maybe we can just send that back and be done with it

## 2023-06-13 ENCOUNTER — Other Ambulatory Visit: Payer: Self-pay | Admitting: Family Medicine

## 2023-06-13 ENCOUNTER — Encounter: Payer: Self-pay | Admitting: Family Medicine

## 2023-06-13 ENCOUNTER — Telehealth: Payer: Self-pay | Admitting: Family Medicine

## 2023-06-13 DIAGNOSIS — F411 Generalized anxiety disorder: Secondary | ICD-10-CM

## 2023-06-13 DIAGNOSIS — F331 Major depressive disorder, recurrent, moderate: Secondary | ICD-10-CM

## 2023-06-13 NOTE — Telephone Encounter (Signed)
 Copied from CRM [id number]. Topic: Referral - Request for Referral >> Jun 13, 2023  9:41 AM Sim Boast F wrote: Did the patient discuss referral with their provider in the last year? Yes, patient declined (If No - schedule appointment) (If Yes - send message)  Appointment offered? No  Type of order/referral and detailed reason for visit: Mental Health   Preference of office, provider, location: Phyllis Ginger at [facility]    If referral order, have you been seen by this specialty before? Yes, she seen a provider through her employment but did not like him/her (If Yes, this issue or another issue? When? Where?  Can we respond through MyChart? Yes

## 2023-06-14 ENCOUNTER — Encounter: Payer: Self-pay | Admitting: Family Medicine

## 2023-06-14 ENCOUNTER — Ambulatory Visit: Payer: BC Managed Care – PPO | Admitting: Family Medicine

## 2023-06-14 VITALS — BP 112/80 | HR 81 | Temp 98.1°F | Ht 67.0 in | Wt 179.1 lb

## 2023-06-14 DIAGNOSIS — F411 Generalized anxiety disorder: Secondary | ICD-10-CM

## 2023-06-14 DIAGNOSIS — F331 Major depressive disorder, recurrent, moderate: Secondary | ICD-10-CM

## 2023-06-14 MED ORDER — SERTRALINE HCL 100 MG PO TABS: 100.0000 mg | ORAL_TABLET | Freq: Every day | ORAL | 3 refills | Status: DC

## 2023-06-14 NOTE — Assessment & Plan Note (Signed)
 Acute, inadequate control despite sertraline 50 mg daily x 4 weeks.  Will move forward with increasing dose to 100 mg daily.  Hopefully the counselor she has an appointment with next week will be a better fit for her.  She denies suicidal ideation and states that although she continues to be able to care for her children. She is unable to perform her job tasks at this time.  Her FMLA is until March 21.  She will return a day prior for evaluation to determine if she will be ready at that time or if we need to extend the [facility].

## 2023-06-14 NOTE — Assessment & Plan Note (Signed)
 Acute, inadequate control She has noted some improvement with overwhelming panicky symptoms when she takes the Atarax as needed.  She denies any side effects.

## 2023-06-14 NOTE — Progress Notes (Signed)
 Patient ID: Ariel Hale, female    DOB: 1990/10/02, 33 y.o.   MRN: [id number]  This visit was conducted in person.  BP 112/80 (BP Location: Left Arm, Patient Position: Sitting, Cuff Size: Normal)   Pulse 81   Temp 98.1 F (36.7 C) (Temporal)   Ht 5\' 7"  (1.702 m)   Wt 179 lb 2 oz (81.3 kg)   LMP 05/31/2023 (Approximate)   SpO2 99%   BMI 28.05 kg/m    CC:  Chief Complaint  Patient presents with   Depression    Folllow Up    Anxiety    Subjective:   HPI: Ariel Hale is a 33 y.o. female presenting on 06/14/2023 for Depression (Folllow Up/) and Anxiety   GAD, MDD: she has now been on sertraline 50 mg daily for 1 month.  She has been compliant with the medication, only missed one day.  No SE. She has not been meshing well with work Veterinary surgeon... referral placed... She has an appt with Irving Burton next week   She is able to sleep all night, but still always wants to isolate herself.  Trying to distract herself or refocus... cannot concentrate, cannot complete tasks.  Lack of motivation.   If feeling overwhelmed.. hydroxyzine helps her calm down... she has been suing every other day.    06/14/2023   11:25 AM 05/31/2023   11:33 AM 05/16/2023    2:02 PM 05/26/2020    9:02 AM  GAD 7 : Generalized Anxiety Score  Nervous, Anxious, on Edge 3 3 2  0  Control/stop worrying 2 2 2 1   Worry too much - different things 2 2 2 1   Trouble relaxing 3 3 3  0  Restless 3 2 2  0  Easily annoyed or irritable 3 3 3 1   Afraid - awful might happen 2 2 2  0  Total GAD 7 Score 18 17 16 3   Anxiety Difficulty Extremely difficult Extremely difficult Extremely difficult Not difficult at all   Out on FMLA until 06/28/2023.     06/14/2023   11:25 AM 05/31/2023   11:33 AM 05/16/2023    2:01 PM  Depression screen PHQ 2/9  Decreased Interest 2 2 2   Down, Depressed, Hopeless 3 3 3   PHQ - 2 Score 5 5 5   Altered sleeping 2 3 2   Tired, decreased energy 2 3 3   Change in appetite 3 2 1   Feeling  bad or failure about yourself  2 3 3   Trouble concentrating 3 2 2   Moving slowly or fidgety/restless 3 2 1   Suicidal thoughts 0 0 0  PHQ-9 Score 20 20 17   Difficult doing work/chores Extremely dIfficult Extremely dIfficult Extremely dIfficult         Relevant past medical, surgical, family and social history reviewed and updated as indicated. Interim medical history since our last visit reviewed. Allergies and medications reviewed and updated. Outpatient Medications Prior to Visit  Medication Sig Dispense Refill   hydrOXYzine (ATARAX) 10 MG tablet Take 1 tablet (10 mg total) by mouth 3 (three) times daily as needed. 30 tablet 0   levonorgestrel (MIRENA) 20 MCG/DAY IUD 1 each by Intrauterine route once.     sertraline (ZOLOFT) 50 MG tablet Take 1 tablet (50 mg total) by mouth daily. 30 tablet 3   No facility-administered medications prior to visit.     Per HPI unless specifically indicated in ROS section below Review of Systems  Constitutional:  Negative for fatigue and fever.  HENT:  Negative  for congestion.   Eyes:  Negative for pain.  Respiratory:  Negative for cough and shortness of breath.   Cardiovascular:  Negative for chest pain, palpitations and leg swelling.  Gastrointestinal:  Negative for abdominal pain.  Genitourinary:  Negative for dysuria and vaginal bleeding.  Musculoskeletal:  Negative for back pain.  Neurological:  Negative for syncope, light-headedness and headaches.  Psychiatric/Behavioral:  Negative for dysphoric mood.    Objective:  BP 112/80 (BP Location: Left Arm, Patient Position: Sitting, Cuff Size: Normal)   Pulse 81   Temp 98.1 F (36.7 C) (Temporal)   Ht 5\' 7"  (1.702 m)   Wt 179 lb 2 oz (81.3 kg)   LMP 05/31/2023 (Approximate)   SpO2 99%   BMI 28.05 kg/m   Wt Readings from Last 3 Encounters:  06/14/23 179 lb 2 oz (81.3 kg)  05/31/23 181 lb (82.1 kg)  05/16/23 181 lb 3.2 oz (82.2 kg)      Physical Exam Constitutional:      General: She  is not in acute distress.    Appearance: Normal appearance. She is well-developed. She is not ill-appearing or toxic-appearing.  HENT:     Head: Normocephalic.     Right Ear: Hearing, tympanic membrane, ear canal and external ear normal. Tympanic membrane is not erythematous, retracted or bulging.     Left Ear: Hearing, tympanic membrane, ear canal and external ear normal. Tympanic membrane is not erythematous, retracted or bulging.     Nose: No mucosal edema or rhinorrhea.     Right Sinus: No maxillary sinus tenderness or frontal sinus tenderness.     Left Sinus: No maxillary sinus tenderness or frontal sinus tenderness.     Mouth/Throat:     Pharynx: Uvula midline.  Eyes:     General: Lids are normal. Lids are everted, no foreign bodies appreciated.     Conjunctiva/sclera: Conjunctivae normal.     Pupils: Pupils are equal, round, and reactive to light.  Neck:     Thyroid: No thyroid mass or thyromegaly.     Vascular: No carotid bruit.     Trachea: Trachea normal.  Cardiovascular:     Rate and Rhythm: Normal rate and regular rhythm.     Pulses: Normal pulses.     Heart sounds: Normal heart sounds, S1 normal and S2 normal. No murmur heard.    No friction rub. No gallop.  Pulmonary:     Effort: Pulmonary effort is normal. No tachypnea or respiratory distress.     Breath sounds: Normal breath sounds. No decreased breath sounds, wheezing, rhonchi or rales.  Abdominal:     General: Bowel sounds are normal.     Palpations: Abdomen is soft.     Tenderness: There is no abdominal tenderness.  Musculoskeletal:     Cervical back: Normal range of motion and neck supple.  Skin:    General: Skin is warm and dry.     Findings: No rash.  Neurological:     Mental Status: She is alert.  Psychiatric:        Mood and Affect: Mood is not anxious or depressed. Affect is flat.        Speech: Speech normal.        Behavior: Behavior is withdrawn. Behavior is cooperative.        Thought Content:  Thought content normal. Thought content does not include homicidal or suicidal ideation. Thought content does not include homicidal or suicidal plan.        Cognition and  Memory: Cognition normal.        Judgment: Judgment normal.       Results for orders placed or performed in visit on 07/10/21  POCT rapid strep A   Collection Time: 07/10/21 11:24 AM  Result Value Ref Range   Rapid Strep A Screen Negative Negative  POC COVID-19 BinaxNow   Collection Time: 07/10/21 11:50 AM  Result Value Ref Range   SARS Coronavirus 2 Ag Negative Negative    Assessment and Plan  Moderate episode of recurrent major depressive disorder (HCC) Assessment & Plan: Acute, inadequate control despite sertraline 50 mg daily x 4 weeks.  Will move forward with increasing dose to 100 mg daily.  Hopefully the counselor she has an appointment with next week will be a better fit for her.  She denies suicidal ideation and states that although she continues to be able to care for her children. She is unable to perform her job tasks at this time.  Her FMLA is until March 21.  She will return a day prior for evaluation to determine if she will be ready at that time or if we need to extend the [facility].   GAD (generalized anxiety disorder) Assessment & Plan: Acute, inadequate control She has noted some improvement with overwhelming panicky symptoms when she takes the Atarax as needed.  She denies any side effects.    Other orders -     Sertraline HCl; Take 1 tablet (100 mg total) by mouth daily.  Dispense: 30 tablet; Refill: 3    Return in about 13 days (around 06/27/2023) for follow up mood.   Kerby Nora, MD

## 2023-06-18 ENCOUNTER — Telehealth: Payer: Self-pay | Admitting: *Deleted

## 2023-06-18 NOTE — Telephone Encounter (Signed)
 Work note written as requested and sent to [employer] Spoke with Toni Amend and she ask that I fax ATTN: Jessica at [phone].  Letter faxed as requested.

## 2023-06-18 NOTE — Telephone Encounter (Signed)
 Copied from CRM [id number]. Topic: General - Other >> Jun 18, 2023  9:47 AM Aletta Edouard wrote: Reason for CRM: patient is needing a dr note for work to be out of work  until march 21st she would like a call back regarding this if needed

## 2023-06-19 NOTE — Telephone Encounter (Signed)
 Letter written and faxed yesterday 06/18/23. (There was also a phone encounter).

## 2023-06-20 ENCOUNTER — Ambulatory Visit: Admitting: Licensed Clinical Social Worker

## 2023-06-20 DIAGNOSIS — F411 Generalized anxiety disorder: Secondary | ICD-10-CM

## 2023-06-20 DIAGNOSIS — F332 Major depressive disorder, recurrent severe without psychotic features: Secondary | ICD-10-CM | POA: Diagnosis not present
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
## 2023-06-24 NOTE — Assessment & Plan Note (Signed)
 Acute, inadequate control She has noted some temporary improvement with overwhelming panicky symptoms when she takes the Atarax as needed.  She denies any side effects but states her husband says it makes her zombielike.

## 2023-06-24 NOTE — Assessment & Plan Note (Signed)
 Acute, inadequate control despite sertraline 50 mg daily x 2 weeks.  We will give the sertraline more time to begin working and she will continue working on setting up with a Veterinary surgeon.  Her first visit is June 13, 2023.  She denies suicidal ideation and states that although she continues to be able to care for her children, She is unable to perform her job tasks at this time.  Her FMLA is until March 21.  She will return a day prior for evaluation to determine if she will be ready at that time or if we need to extend the [facility].

## 2023-06-25 ENCOUNTER — Ambulatory Visit: Admitting: Licensed Clinical Social Worker

## 2023-06-25 DIAGNOSIS — F332 Major depressive disorder, recurrent severe without psychotic features: Secondary | ICD-10-CM | POA: Diagnosis not present

## 2023-06-25 DIAGNOSIS — F411 Generalized anxiety disorder: Secondary | ICD-10-CM
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
## 2023-06-26 ENCOUNTER — Ambulatory Visit (INDEPENDENT_AMBULATORY_CARE_PROVIDER_SITE_OTHER): Admitting: Family Medicine

## 2023-06-26 ENCOUNTER — Encounter: Payer: Self-pay | Admitting: Family Medicine

## 2023-06-26 ENCOUNTER — Ambulatory Visit: Admitting: Family Medicine

## 2023-06-26 VITALS — BP 108/74 | HR 92 | Temp 98.4°F | Ht 67.0 in | Wt 180.0 lb

## 2023-06-26 DIAGNOSIS — F332 Major depressive disorder, recurrent severe without psychotic features: Secondary | ICD-10-CM

## 2023-06-26 DIAGNOSIS — F411 Generalized anxiety disorder: Secondary | ICD-10-CM | POA: Diagnosis not present

## 2023-06-26 MED ORDER — BUPROPION HCL ER (XL) 150 MG PO TB24: 150.0000 mg | ORAL_TABLET | Freq: Every day | ORAL | 3 refills | Status: DC

## 2023-06-26 NOTE — Assessment & Plan Note (Signed)
 Acute, inadequate control despite sertraline 100 mg daily x 2 weeks.   Now seeing a counselor weekly but has only had one seesion.. Meshing well.    Add Wellbutrin XL 150 mg daily as mood adjust to current regimen. Take in the morning for energy.   Continue counseling .  Follow up in 2 weeks for mood reassessment.  She denies suicidal ideation and states that although she continues to be able to care for her children, She is unable to perform her job tasks at this time.   Extend leave until 4.4.2025  She will return a day prior for evaluation to determine if she will be ready at that time or if we need to extend the [facility].

## 2023-06-26 NOTE — Patient Instructions (Addendum)
 Add Wellbutrin XL 150 mg daily as mood adjust to current regimen. Take in the mrning.   Continue counseling .  Follow up in 2 weeks for mood reassessment.  Extend leave out until 07/12/2023

## 2023-06-26 NOTE — Progress Notes (Signed)
 Patient ID: Ariel Hale, female    DOB: 1990-10-03, 33 y.o.   MRN: [id number]  This visit was conducted in person.  BP 108/74   Pulse 92   Temp 98.4 F (36.9 C) (Oral)   Ht 5\' 7"  (1.702 m)   Wt 180 lb (81.6 kg)   LMP 05/31/2023 (Approximate)   SpO2 98%   BMI 28.19 kg/m    CC:  Chief Complaint  Patient presents with   Follow up on mood    Patient states that she has had "better days"     Subjective:   HPI: Ariel Hale is a 33 y.o. female presenting on 06/26/2023 for Follow up on mood (Patient states that she has had "better days" )   GAD, MDD: she has now been on sertraline 100 mg daily for  2 weeks  She has been compliant with the medication.  No SE... she has noted decrease in sexual   Now seeing a counselor, planning weekly visits. Just started with new counselor yesterday.   She is able to sleep all night, but still always wants to isolate herself.  Trying to distract herself or refocus... cannot concentrate, cannot complete tasks.  Lack of motivation.   If feeling overwhelmed.. hydroxyzine helps her calm down... she has been  using less often.   Husband has gotten a job. Toddler is now in daycare. She continues to have  angry , emotional upset and anxiety regarding work situation.  She continue to have memory issues, foggy thinking.     06/26/2023    9:19 AM 06/14/2023   11:25 AM 05/31/2023   11:33 AM 05/16/2023    2:02 PM  GAD 7 : Generalized Anxiety Score  Nervous, Anxious, on Edge 3 3 3 2   Control/stop worrying 2 2 2 2   Worry too much - different things 2 2 2 2   Trouble relaxing 3 3 3 3   Restless 3 3 2 2   Easily annoyed or irritable 3 3 3 3   Afraid - awful might happen 3 2 2 2   Total GAD 7 Score 19 18 17 16   Anxiety Difficulty Extremely difficult Extremely difficult Extremely difficult Extremely difficult   Out on FMLA until 06/28/2023.     06/26/2023    9:18 AM 06/14/2023   11:25 AM 05/31/2023   11:33 AM  Depression screen PHQ 2/9   Decreased Interest 2 2 2   Down, Depressed, Hopeless 3 3 3   PHQ - 2 Score 5 5 5   Altered sleeping 2 2 3   Tired, decreased energy 2 2 3   Change in appetite 3 3 2   Feeling bad or failure about yourself  2 2 3   Trouble concentrating 3 3 2   Moving slowly or fidgety/restless 3 3 2   Suicidal thoughts 0 0 0  PHQ-9 Score 20 20 20   Difficult doing work/chores Extremely dIfficult Extremely dIfficult Extremely dIfficult         Relevant past medical, surgical, family and social history reviewed and updated as indicated. Interim medical history since our last visit reviewed. Allergies and medications reviewed and updated. Outpatient Medications Prior to Visit  Medication Sig Dispense Refill   hydrOXYzine (ATARAX) 10 MG tablet Take 1 tablet (10 mg total) by mouth 3 (three) times daily as needed. 30 tablet 0   levonorgestrel (MIRENA) 20 MCG/DAY IUD 1 each by Intrauterine route once.     sertraline (ZOLOFT) 100 MG tablet Take 1 tablet (100 mg total) by mouth daily. 30 tablet 3  No facility-administered medications prior to visit.     Per HPI unless specifically indicated in ROS section below Review of Systems  Constitutional:  Negative for fatigue and fever.  HENT:  Negative for congestion.   Eyes:  Negative for pain.  Respiratory:  Negative for cough and shortness of breath.   Cardiovascular:  Negative for chest pain, palpitations and leg swelling.  Gastrointestinal:  Negative for abdominal pain.  Genitourinary:  Negative for dysuria and vaginal bleeding.  Musculoskeletal:  Negative for back pain.  Neurological:  Negative for syncope, light-headedness and headaches.  Psychiatric/Behavioral:  Negative for dysphoric mood.    Objective:  BP 108/74   Pulse 92   Temp 98.4 F (36.9 C) (Oral)   Ht 5\' 7"  (1.702 m)   Wt 180 lb (81.6 kg)   LMP 05/31/2023 (Approximate)   SpO2 98%   BMI 28.19 kg/m   Wt Readings from Last 3 Encounters:  06/26/23 180 lb (81.6 kg)  06/14/23 179 lb 2 oz  (81.3 kg)  05/31/23 181 lb (82.1 kg)      Physical Exam Constitutional:      General: She is not in acute distress.    Appearance: Normal appearance. She is well-developed. She is not ill-appearing or toxic-appearing.  HENT:     Head: Normocephalic.     Right Ear: Hearing, tympanic membrane, ear canal and external ear normal. Tympanic membrane is not erythematous, retracted or bulging.     Left Ear: Hearing, tympanic membrane, ear canal and external ear normal. Tympanic membrane is not erythematous, retracted or bulging.     Nose: No mucosal edema or rhinorrhea.     Right Sinus: No maxillary sinus tenderness or frontal sinus tenderness.     Left Sinus: No maxillary sinus tenderness or frontal sinus tenderness.     Mouth/Throat:     Pharynx: Uvula midline.  Eyes:     General: Lids are normal. Lids are everted, no foreign bodies appreciated.     Conjunctiva/sclera: Conjunctivae normal.     Pupils: Pupils are equal, round, and reactive to light.  Neck:     Thyroid: No thyroid mass or thyromegaly.     Vascular: No carotid bruit.     Trachea: Trachea normal.  Cardiovascular:     Rate and Rhythm: Normal rate and regular rhythm.     Pulses: Normal pulses.     Heart sounds: Normal heart sounds, S1 normal and S2 normal. No murmur heard.    No friction rub. No gallop.  Pulmonary:     Effort: Pulmonary effort is normal. No tachypnea or respiratory distress.     Breath sounds: Normal breath sounds. No decreased breath sounds, wheezing, rhonchi or rales.  Abdominal:     General: Bowel sounds are normal.     Palpations: Abdomen is soft.     Tenderness: There is no abdominal tenderness.  Musculoskeletal:     Cervical back: Normal range of motion and neck supple.  Skin:    General: Skin is warm and dry.     Findings: No rash.  Neurological:     Mental Status: She is alert.  Psychiatric:        Mood and Affect: Mood is not anxious or depressed. Affect is flat.        Speech: Speech  normal.        Behavior: Behavior is withdrawn. Behavior is cooperative.        Thought Content: Thought content normal. Thought content does not include homicidal or  suicidal ideation. Thought content does not include homicidal or suicidal plan.        Cognition and Memory: Cognition normal.        Judgment: Judgment normal.       Results for orders placed or performed in visit on 07/10/21  POCT rapid strep A   Collection Time: 07/10/21 11:24 AM  Result Value Ref Range   Rapid Strep A Screen Negative Negative  POC COVID-19 BinaxNow   Collection Time: 07/10/21 11:50 AM  Result Value Ref Range   SARS Coronavirus 2 Ag Negative Negative    Assessment and Plan  GAD (generalized anxiety disorder)  Severe episode of recurrent major depressive disorder, without psychotic features (HCC) Assessment & Plan: Acute, inadequate control despite sertraline 100 mg daily x 2 weeks.   Now seeing a counselor weekly but has only had one seesion.. Meshing well.    Add Wellbutrin XL 150 mg daily as mood adjust to current regimen. Take in the morning for energy.   Continue counseling .  Follow up in 2 weeks for mood reassessment.  She denies suicidal ideation and states that although she continues to be able to care for her children, She is unable to perform her job tasks at this time.   Extend leave until 4.4.2025  She will return a day prior for evaluation to determine if she will be ready at that time or if we need to extend the [facility].   Other orders -     buPROPion HCl ER (XL); Take 1 tablet (150 mg total) by mouth daily.  Dispense: 30 tablet; Refill: 3     Return in about 2 weeks (around 07/10/2023) for follow up mood.   Kerby Nora, MD

## 2023-07-01 ENCOUNTER — Telehealth: Payer: Self-pay

## 2023-07-01 ENCOUNTER — Encounter: Payer: Self-pay | Admitting: *Deleted

## 2023-07-01 NOTE — Telephone Encounter (Signed)
 Copied from CRM [id number]. Topic: General - Other >> Jul 01, 2023 11:18 AM [name] wrote: Reason for CRM: Pt would like to know when her return date for work. Please contact pt via Mychart.

## 2023-07-02 ENCOUNTER — Ambulatory Visit (INDEPENDENT_AMBULATORY_CARE_PROVIDER_SITE_OTHER): Admitting: Licensed Clinical Social Worker

## 2023-07-02 DIAGNOSIS — F411 Generalized anxiety disorder: Secondary | ICD-10-CM

## 2023-07-02 DIAGNOSIS — F332 Major depressive disorder, recurrent severe without psychotic features: Secondary | ICD-10-CM

## 2023-07-02 NOTE — Progress Notes (Unsigned)
 [facility] Counselor/Therapist Progress Note  Patient ID: Ariel Hale, MRN: [id number]    Date: 07/02/23  Time Spent: 0205  pm - 0300 pm : 55 Minutes  Treatment Type: Individual Therapy.  Reported Symptoms: Patient reports she has a lot going on at home. Reports a panic attack on February 6th of 2025. Patient has been out of work since then. She has 2 children at home and a marriage that at times can be a struggle but is improving. Patient reports anxiety and stress and feeling that she doesn't know how to handle people and stress at work and all the meetings required by new management.        Mental Status Exam: Appearance:  Casual     Behavior: Appropriate  Motor: Normal  Speech/Language:  Clear and Coherent  Affect: Appropriate  Mood: anxious  Thought process: normal  Thought content:   WNL  Sensory/Perceptual disturbances:   WNL  Orientation: oriented to person, place, time/date, situation, day of week, month of year, and year  Attention: Good  Concentration: Good  Memory: WNL  Fund of knowledge:  Good  Insight:   Good  Judgment:  Good  Impulse Control: Good    Risk Assessment: Danger to Self:  No Self-injurious Behavior: No Danger to Others: No Duty to Warn:no Physical Aggression / Violence:No  Access to Firearms a concern: No  Gang Involvement:No    Subjective:    Ariel Hale participated from office, located at [employer] with Clinician present. Ariel Hale consented to treatment.   Ariel Hale presented for her session reporting that she has been getting her a space together for an office and a planner to assist in staying on task. Patient reports that she and her spouse had some issues over the weekend. She states that she want things to change and feels that he is drinking too much and its a problem. Patient reports that she doesn't feel comfortable discussing these things with him. Patient reports that she knows she needs to  address this for both her and the children's sake.   Clinician actively listened as patient shared her concerns. Clinician provided support and understanding through verbal interaction. Clinician processed with patient her concerns and identified the negative effects of the drinking on everyone involved. Clinician praised patient for her insight and desire to make changes in her home and see her husband stop drinking. Clinician processed with patient that her husband must be the one to want to stop drinking and make changes before the change will be successful.  Patient was actively involved in discussion and was motivated for change. Patient has shown improvement in her symptoms of depression and anxiety as well as made provisions to improve her overall work dynamic. Patient will continue with weekly therapy and treatment plan will be reviewed by 06/19/2024. Patient will focus on coping skills to assist her with her symptoms. Patient will  practice deep breathing exercises for 5 minutes every morning for the next 30 days." Patient will utilize personal time for herself as a  coping strategy for 1 hour minutes each day for the next 60 days.   Cognitive Behavioral Therapy  Diagnosis: Severe episode of recurrent major depressive disorder, without psychotic features./Generalized Anxiety Disorder  Phyllis Ginger MSW, LCSW/DATE 07/02/2023

## 2023-07-10 ENCOUNTER — Ambulatory Visit (INDEPENDENT_AMBULATORY_CARE_PROVIDER_SITE_OTHER): Admitting: Family Medicine

## 2023-07-10 ENCOUNTER — Encounter: Payer: Self-pay | Admitting: Family Medicine

## 2023-07-10 ENCOUNTER — Telehealth: Payer: Self-pay | Admitting: Family Medicine

## 2023-07-10 VITALS — BP 124/82 | HR 83 | Temp 97.5°F | Ht 67.0 in | Wt 180.0 lb

## 2023-07-10 DIAGNOSIS — F332 Major depressive disorder, recurrent severe without psychotic features: Secondary | ICD-10-CM

## 2023-07-10 DIAGNOSIS — F411 Generalized anxiety disorder: Secondary | ICD-10-CM

## 2023-07-10 NOTE — Telephone Encounter (Signed)
 Copied from CRM [id number]. Topic: General - Other >> Jul 10, 2023 11:52 AM Gurney Maxin H wrote: Reason for CRM: Patient is calling to have her FMLA paperwork updated to have an intermittent leave instead of continuance leave for appointments and therapy, please reach out to patient, thanks.  Toni Amend [phone]

## 2023-07-10 NOTE — Assessment & Plan Note (Signed)
 Chronic, significant improvement with sertraline 100 mg daily, Wellbutrin 150 mg XL p.o. daily. She is doing well seeing the therapist and is learning some coping strategies.  We did discuss in detail today how to manage stressors without panic attacks and excessive emotional response in a functional way..  Discussed stress reduction and relaxation techniques. She will discuss this more in detail with her counselor.  She will follow-up in 1 month post return to work for reassessment.

## 2023-07-10 NOTE — Telephone Encounter (Signed)
 Form placed in provider box for review. No additional information needed.

## 2023-07-10 NOTE — Telephone Encounter (Addendum)
 Spoke with Ariel Hale.   She states today she just needed a letter faxed to Ariel Hale with return to work date.  I will take care of that.   But as far as the FMLA forms, she states her boss told her she can now do intermittent FMLA through May 1st that will help cover her for when she has upcoming appointments with her counselor and other doctor appointments.  She thinks its the same [facility] paperwork but now needs to state Intermittent FMLA.  Will print off blank forms and give to Erin to complete for intermittent fmla.

## 2023-07-10 NOTE — Progress Notes (Signed)
 Patient ID: Ariel Hale, female    DOB: 12-Mar-1991, 33 y.o.   MRN: [id number]  This visit was conducted in person.  BP 124/82   Pulse 83   Temp (!) 97.5 F (36.4 C) (Temporal)   Ht 5\' 7"  (1.702 m)   Wt 180 lb (81.6 kg)   SpO2 98%   BMI 28.19 kg/m    CC:  Chief Complaint  Patient presents with   Medical Management of Chronic Issues    2 week mood f/u    Subjective:   HPI: Ariel Hale is a 33 y.o. female presenting on 07/10/2023 for Medical Management of Chronic Issues (2 week mood f/u)   GAD, MDD: she has now been on sertraline 100 mg daily for  4 weks.  At last OV added Wellbutrin 150 mg XL to regimen  She has been compliant with the medication.  She had been feeling better until this morning when she spoke with co-worker about work.. triggered a panic attack. She is worried about returning to work and handling specific stressors.  She does have an appointment with therapist tomorrow where she can work through this prior to her return to work next week.   Sleep has improved.  She has been lashing out less at her children.  She has been feeling more motivated, less worry.  She is doing well with therapy... likes the therapist.. has helped her to get organized.   Now seeing a counselor, planning weekly visits. Just started with new counselor yesterday.   She is able to sleep all night, but still always wants to isolate herself.  Trying to distract herself or refocus... cannot concentrate, cannot complete tasks.  Lack of motivation.   If feeling overwhelmed.. hydroxyzine helps her calm down... she has been  using less often.   Husband has gotten a job. Toddler is now in daycare. She continues to have  angry , emotional upset and anxiety regarding work situation.  She continue to have memory issues, foggy thinking.     07/10/2023    8:50 AM 06/26/2023    9:19 AM 06/14/2023   11:25 AM 05/31/2023   11:33 AM  GAD 7 : Generalized Anxiety Score   Nervous, Anxious, on Edge 3 3 3 3   Control/stop worrying 2 2 2 2   Worry too much - different things 2 2 2 2   Trouble relaxing 1 3 3 3   Restless 0 3 3 2   Easily annoyed or irritable 2 3 3 3   Afraid - awful might happen 2 3 2 2   Total GAD 7 Score 12 19 18 17   Anxiety Difficulty Extremely difficult Extremely difficult Extremely difficult Extremely difficult   Out on FMLA until  4/4     07/10/2023    8:50 AM 06/26/2023    9:18 AM 06/14/2023   11:25 AM  Depression screen PHQ 2/9  Decreased Interest 0 2 2  Down, Depressed, Hopeless 3 3 3   PHQ - 2 Score 3 5 5   Altered sleeping 0 2 2  Tired, decreased energy 1 2 2   Change in appetite 1 3 3   Feeling bad or failure about yourself  0 2 2  Trouble concentrating 1 3 3   Moving slowly or fidgety/restless 2 3 3   Suicidal thoughts 0 0 0  PHQ-9 Score 8 20 20   Difficult doing work/chores Extremely dIfficult Extremely dIfficult Extremely dIfficult         Relevant past medical, surgical, family and social history reviewed and  updated as indicated. Interim medical history since our last visit reviewed. Allergies and medications reviewed and updated. Outpatient Medications Prior to Visit  Medication Sig Dispense Refill   buPROPion (WELLBUTRIN XL) 150 MG 24 hr tablet Take 1 tablet (150 mg total) by mouth daily. 30 tablet 3   hydrOXYzine (ATARAX) 10 MG tablet Take 1 tablet (10 mg total) by mouth 3 (three) times daily as needed. 30 tablet 0   levonorgestrel (MIRENA) 20 MCG/DAY IUD 1 each by Intrauterine route once.     sertraline (ZOLOFT) 100 MG tablet Take 1 tablet (100 mg total) by mouth daily. 30 tablet 3   No facility-administered medications prior to visit.     Per HPI unless specifically indicated in ROS section below Review of Systems  Constitutional:  Negative for fatigue and fever.  HENT:  Negative for congestion.   Eyes:  Negative for pain.  Respiratory:  Negative for cough and shortness of breath.   Cardiovascular:  Negative for  chest pain, palpitations and leg swelling.  Gastrointestinal:  Negative for abdominal pain.  Genitourinary:  Negative for dysuria and vaginal bleeding.  Musculoskeletal:  Negative for back pain.  Neurological:  Negative for syncope, light-headedness and headaches.  Psychiatric/Behavioral:  Negative for dysphoric mood.    Objective:  BP 124/82   Pulse 83   Temp (!) 97.5 F (36.4 C) (Temporal)   Ht 5\' 7"  (1.702 m)   Wt 180 lb (81.6 kg)   SpO2 98%   BMI 28.19 kg/m   Wt Readings from Last 3 Encounters:  07/10/23 180 lb (81.6 kg)  06/26/23 180 lb (81.6 kg)  06/14/23 179 lb 2 oz (81.3 kg)      Physical Exam Constitutional:      General: She is not in acute distress.    Appearance: Normal appearance. She is well-developed. She is not ill-appearing or toxic-appearing.  HENT:     Head: Normocephalic.     Right Ear: Hearing, tympanic membrane, ear canal and external ear normal. Tympanic membrane is not erythematous, retracted or bulging.     Left Ear: Hearing, tympanic membrane, ear canal and external ear normal. Tympanic membrane is not erythematous, retracted or bulging.     Nose: No mucosal edema or rhinorrhea.     Right Sinus: No maxillary sinus tenderness or frontal sinus tenderness.     Left Sinus: No maxillary sinus tenderness or frontal sinus tenderness.     Mouth/Throat:     Pharynx: Uvula midline.  Eyes:     General: Lids are normal. Lids are everted, no foreign bodies appreciated.     Conjunctiva/sclera: Conjunctivae normal.     Pupils: Pupils are equal, round, and reactive to light.  Neck:     Thyroid: No thyroid mass or thyromegaly.     Vascular: No carotid bruit.     Trachea: Trachea normal.  Cardiovascular:     Rate and Rhythm: Normal rate and regular rhythm.     Pulses: Normal pulses.     Heart sounds: Normal heart sounds, S1 normal and S2 normal. No murmur heard.    No friction rub. No gallop.  Pulmonary:     Effort: Pulmonary effort is normal. No tachypnea or  respiratory distress.     Breath sounds: Normal breath sounds. No decreased breath sounds, wheezing, rhonchi or rales.  Abdominal:     General: Bowel sounds are normal.     Palpations: Abdomen is soft.     Tenderness: There is no abdominal tenderness.  Musculoskeletal:  Cervical back: Normal range of motion and neck supple.  Skin:    General: Skin is warm and dry.     Findings: No rash.  Neurological:     Mental Status: She is alert.  Psychiatric:        Mood and Affect: Mood is not anxious or depressed. Affect is flat.        Speech: Speech normal.        Behavior: Behavior is withdrawn. Behavior is cooperative.        Thought Content: Thought content normal. Thought content does not include homicidal or suicidal ideation. Thought content does not include homicidal or suicidal plan.        Cognition and Memory: Cognition normal.        Judgment: Judgment normal.       Results for orders placed or performed in visit on 07/10/21  POCT rapid strep A   Collection Time: 07/10/21 11:24 AM  Result Value Ref Range   Rapid Strep A Screen Negative Negative  POC COVID-19 BinaxNow   Collection Time: 07/10/21 11:50 AM  Result Value Ref Range   SARS Coronavirus 2 Ag Negative Negative    Assessment and Plan  Severe episode of recurrent major depressive disorder, without psychotic features (HCC) Assessment & Plan: Chronic, significant improvement with sertraline 100 mg daily, Wellbutrin 150 mg XL p.o. daily. She is doing well seeing the therapist and is learning some coping strategies.  We did discuss in detail today how to manage stressors without panic attacks and excessive emotional response in a functional way..  Discussed stress reduction and relaxation techniques. She will discuss this more in detail with her counselor.  She will follow-up in 1 month post return to work for reassessment.   GAD (generalized anxiety disorder) Assessment & Plan: Chronic, significant  improvement on current regimen.  We discussed prior to known stressors such as a meeting or reading a known stressful email that she can use hydroxyzine half a tablet to help her avoid stress response.       Return in about 4 weeks (around 08/07/2023) for follow up mood, annual physical with fasting labs prior.   Kerby Nora, MD

## 2023-07-10 NOTE — Assessment & Plan Note (Signed)
 Chronic, significant improvement on current regimen.  We discussed prior to known stressors such as a meeting or reading a known stressful email that she can use hydroxyzine half a tablet to help her avoid stress response.

## 2023-07-10 NOTE — Telephone Encounter (Signed)
 FMLA for Patient forms received for completion for patient. Patient has been informed that process may take up to 5 business days.  Employer Name [employer] Reason for being out: Continuation of care Any inpatient care: N/A Any Planned appointment?  Patient is requesting start date of  Patient is requesting end date of 5.1.25  Or patient is requesting duration of 3  hours  with 2 episodes every 1weeks1.   Verified with patient that it is ok to leave Voicemail updates on  Mobile [phone] (mobile)  Patient would like to pick up copy in our office when form is completed.

## 2023-07-10 NOTE — Telephone Encounter (Signed)
 Please reach out to patient regarding what she needs.  FMLA paperwork was faxed back today by Cloyde Reams with return to work date July 15, 2023. She had an appointment today as well where we discussed returning to work

## 2023-07-11 ENCOUNTER — Ambulatory Visit: Admitting: Licensed Clinical Social Worker

## 2023-07-11 DIAGNOSIS — F411 Generalized anxiety disorder: Secondary | ICD-10-CM | POA: Diagnosis not present

## 2023-07-11 DIAGNOSIS — Z0279 Encounter for issue of other medical certificate: Secondary | ICD-10-CM

## 2023-07-11 DIAGNOSIS — F332 Major depressive disorder, recurrent severe without psychotic features: Secondary | ICD-10-CM | POA: Diagnosis not present

## 2023-07-11 NOTE — Progress Notes (Signed)
 [facility] Counselor/Therapist Progress Note  Patient ID: Ariel Hale, MRN: [id number]    Date: 07/11/23  Time Spent:  0100 pm - 150 pm : 50 Minutes  Treatment Type: Individual Therapy.  Reported Symptoms: Patient reports she has a lot going on at home. Reports a panic attack on February 6th of 2025. Patient has been out of work since then. She has 2 children at home and a marriage that at times can be a struggle but is improving. Patient reports anxiety and stress and feeling that she doesn't know how to handle people and stress at work and all the meetings required by new management.        Mental Status Exam: Appearance:  Casual     Behavior: Appropriate  Motor: Normal  Speech/Language:  Clear and Coherent  Affect: Appropriate  Mood: anxious  Thought process: normal  Thought content:   WNL  Sensory/Perceptual disturbances:   WNL  Orientation: oriented to person, place, time/date, situation, day of week, month of year, and year  Attention: Good  Concentration: Good  Memory: WNL  Fund of knowledge:  Good  Insight:   Good  Judgment:  Good  Impulse Control: Good    Risk Assessment: Danger to Self:  No Self-injurious Behavior: No Danger to Others: No Duty to Warn:no Physical Aggression / Violence:No  Access to Firearms a concern: No  Gang Involvement:No    Subjective:    Ariel Hale participated from office, located at [employer] with Clinician present. Ariel Hale consented to treatment.    Ariel Hale presented for her session reporting that she is scheduled to return to work on Monday. Patient processed her feelings and fears about returning to work. She states she was feeling more comfortable about the idea until speaking to a peer at work. Ariel Hale reports that the peer provided negative information that increased patients anxiety and depressive symptoms.   Clinician actively listened and provided support and encouragement through  verbal feedback. Clinician processed with patient the negativity from her peer and how that affected her. Clinician processed that this could be part of the issue. When we see others being negative or feeding into our negativity it can be problematic on many levels. Clinician encouraged patient to utilize therapy as a place to discuss concerns and not allow herself to get caught up in the negative talk at work because of how it increases her anxiety and depression. Clinician processed that negative self-talk is the internal voice that critiques, undermines, and second-guesses nearly every move we make. Even on our best days, that little voice in our minds can belittle [place], or tell us that we're not good enough. Clinician and patient processed ideas for overcoming the negative thoughts.  Patient and Clinician processed techniques for overcoming negative self talk. Patient agreed to practice the following:  Try cognitive restructuring Question your inner critic. Instead of letting destructive thoughts run wild, put them on the stand. Interrogate them with questions like, "Is this belief grounded in reality or just my imagination?" or "What advice would I give to a buddy wrestling with this same thought?" Doing this helps you dismantle those negative narratives and substitute them with a more nuanced viewpoint.  How to practice: Before changing a negative thought, you need to be aware of it. Start by paying attention to what you're telling yourself during anxious moments then employ the following strategy:  Challenge the thought: Question the accuracy and validity of your negative self-talk. Is this thought based on  facts or feelings?  Reframe the thought: Replace the negative thought with a more balanced or positive one. For example, change "I can't do this anymore!" to "I'll try again tomorrow."  Use mindfulness and relaxation techniques: Deep breathing and grounding exercises to help you distance  yourself from anxious thoughts.  ?? If you're having trouble determining how you're feeling, try downloading our Feelings Wheel to help you get clearer.   2. Start a gratitude practice Taking stock of the good in your life can help shift your focus away from the negatives. Believe it or not, jotting things down in a simple gratitude journal can make a significant difference.  How to practice: Each night, jot down three things you're grateful for.  ?? Try Delaney Meigs Levitt's Gratitude program for a structured approach to cultivating a gratitude practice.  3. Practice mindfulness Mindfulness trains you to step back and observe your thoughts instead of judging them. All you have to do is sit still, focus on your breathing, and watch your thoughts float by without giving them a thumbs up or down.  How to practice: There are many ways to practice mindfulness, including meditation, breath work, guided imagery, mindful eating, mindful walking, and more.  ?? Consider beginning with our guided mindfulness programs, such as Mindfulness for Beginners or 7 Days of Self-Esteem.  4. Exercise self-compassion Cut yourself some slack. Think about it: you'd be all ears and advice for a good friend, right? Extend that same level of kindness and patience to yourself. Embrace your imperfections.  How to practice: Practice self-compassion techniques like positive self-talk or self-soothing gestures.   ?? Check out our Radical Self-Compassion program for guided help. This program helps you cultivate a regular practice of compassion and empathy for yourself and your life.   5. Take up journaling Keeping a daily journal can act like a detective's notebook for your mind, helping you pinpoint what sets off your negative self-talk. Once you understand the triggers and patterns, you're better equipped to build strategies to counter them.  How to practice: Write down your thoughts and emotions in your journal then use  our cognitive restructuring technique (above) to help you work through your feelings and challenge those thoughts.   ?? [employer] offers five different journals that you can download to help you develop your journaling practice.  6. Offer yourself positive affirmations Simple, positive phrases can act as a mental switch, changing your outlook and boosting your mindset. It's important to get into the habit of advocating for yourself and picking yourself up when you feel down. Affirmations are a good way to do that, even when you don't feel in the mood.   How to practice: Create a list of positive affirmations, such as "I am capable" or "I am worthy," and repeat them to yourself, especially when you catch yourself in a spiral of negative self-talk.  ?? When you need to connect more deeply with yourself, explore our Relationship with Self series.   7. Practice meditation Meditation provides a structured framework for quieting your mind and gaining control over your thought patterns. The practice often incorporates various techniques like visualization, affirmation repetition, or body scanning to assist in shifting your focus away from negative self-talk.  How to practice: If you don't have a regular meditation practice it can feel intimidating to begin one. One of the best ways to begin is to find a quiet place to sit and just breathe deeply while picturing something positive that means something special to  you. It could be a treasured family member, beloved pet, song you love, book that touched you. Anything that makes you feel warm and fuzzy. Pair that with deep breathing and a quiet space and you're on your way.  ?? For newcomers to meditation, we offer beginner courses tailored to various needs. Curious? Start Here.  8. Seek professional help In some cases, persistent negative thoughts may need expert help. A therapist or psychologist can offer strategies to better understand and control your thought  patterns.   Taking steps to disarm your inner critic and rewire your thought patterns can help you build a healthier, more constructive relationship with yourself. With practice, you can minimize negative self-talk and feel more confident and empowered.   Try meditation to change your thinking Meditation can be a powerful tool for altering the way you think. Much of negative self-talk arises from ingrained, automatic thoughts we've unknowingly accepted as the truth. Meditating can disrupt this cycle, gaining awareness and opening the door to change.  The benefits of meditation in managing negative self-talk Meditation can help you become more aware of your thought patterns.  Meditation can help you detach from your thoughts (i.e., you realize you are simply the observer of your thoughts), thereby reducing their power over your emotions.  Meditation teaches you to accept your current state and thoughts without trying to judge or change them. This can reduce the emotional power of negative self-talk.  Loving-kindness meditation, a form of mindfulness meditation that involves mentally sending goodwill, kindness, and warmth towards others and yourself, can improve self-compassion and help counter negative self-talk.  Incorporating meditation into your daily routine can offer the mental clarity essential for identifying and tackling negative self-talk. Meditation techniques tailored for self-esteem and self-compassion can be beneficial in breaking persistent patterns of self-criticism.   Cognitive Behavioral Therapy   Diagnosis: Severe episode of recurrent major depressive disorder, without psychotic features./Generalized Anxiety Disorder  Gabby will continue with bi weekly CBT and treatment plan will be reviewed by 06/19/2024. Phyllis Ginger MSW, LCSW/DATE 07/11/2023       Phyllis Ginger MSW, LCSW/DATE

## 2023-07-15 ENCOUNTER — Telehealth: Payer: Self-pay | Admitting: *Deleted

## 2023-07-15 ENCOUNTER — Telehealth: Payer: Self-pay | Admitting: Family Medicine

## 2023-07-15 DIAGNOSIS — Z13220 Encounter for screening for lipoid disorders: Secondary | ICD-10-CM

## 2023-07-15 NOTE — Telephone Encounter (Signed)
-----   Message from [name] sent at 07/15/2023  3:04 PM EDT ----- Regarding: Lab orders for [facility], 4.24.25 Patient is scheduled for CPX labs, please order future labs, Thanks , Camelia Eng

## 2023-07-15 NOTE — Telephone Encounter (Signed)
 Called patient to let her know the forms are ready to pick up. Patient requests a change from 3 hours to a full day per episode. Will put in providers box to update and sign.

## 2023-07-15 NOTE — Telephone Encounter (Signed)
 Copied from CRM [id number]. Topic: General - Other >> Jul 15, 2023  2:19 PM Marland Kitchen D wrote: Patient's FMLA paperwork was field out in correctly she wants a call back- [phone]

## 2023-07-17 ENCOUNTER — Telehealth: Payer: Self-pay

## 2023-07-17 NOTE — Telephone Encounter (Signed)
 FMLA forms have been updated to reflect patients request.  Updated forms have been mailed to the patient. Copies of the forms have been sent to scan.

## 2023-07-17 NOTE — Telephone Encounter (Signed)
 Reached out to the patient via mychart to let her know she would be receiving her FMLA form in the mail.

## 2023-07-24 ENCOUNTER — Ambulatory Visit: Admitting: Licensed Clinical Social Worker

## 2023-08-01 ENCOUNTER — Encounter: Payer: Self-pay | Admitting: Family Medicine

## 2023-08-01 ENCOUNTER — Other Ambulatory Visit (INDEPENDENT_AMBULATORY_CARE_PROVIDER_SITE_OTHER)

## 2023-08-01 DIAGNOSIS — Z13220 Encounter for screening for lipoid disorders: Secondary | ICD-10-CM

## 2023-08-01 LAB — COMPREHENSIVE METABOLIC PANEL WITH GFR
ALT: 22 U/L (ref 0–35)
AST: 23 U/L (ref 0–37)
Albumin: 4.4 g/dL (ref 3.5–5.2)
Alkaline Phosphatase: 74 U/L (ref 39–117)
BUN: 15 mg/dL (ref 6–23)
CO2: 29 meq/L (ref 19–32)
Calcium: 9.3 mg/dL (ref 8.4–10.5)
Chloride: 98 meq/L (ref 96–112)
Creatinine, Ser: 0.85 mg/dL (ref 0.40–1.20)
GFR: 90.53 mL/min (ref >60.00)
Glucose, Bld: 87 mg/dL (ref 70–99)
Potassium: 3.9 meq/L (ref 3.5–5.1)
Sodium: 133 meq/L — ABNORMAL LOW (ref 135–145)
Total Bilirubin: 0.8 mg/dL (ref 0.2–1.2)
Total Protein: 7.4 g/dL (ref 6.0–8.3)

## 2023-08-01 LAB — LIPID PANEL
Cholesterol: 184 mg/dL (ref 0–200)
HDL: 51.4 mg/dL (ref >39.00)
LDL Cholesterol: 121 mg/dL — ABNORMAL HIGH (ref 0–99)
NonHDL: 133.09
Total CHOL/HDL Ratio: 4
Triglycerides: 59 mg/dL (ref 0.0–149.0)
VLDL: 11.8 mg/dL (ref 0.0–40.0)

## 2023-08-06 ENCOUNTER — Ambulatory Visit (INDEPENDENT_AMBULATORY_CARE_PROVIDER_SITE_OTHER): Admitting: Licensed Clinical Social Worker

## 2023-08-06 DIAGNOSIS — F331 Major depressive disorder, recurrent, moderate: Secondary | ICD-10-CM

## 2023-08-06 DIAGNOSIS — F411 Generalized anxiety disorder: Secondary | ICD-10-CM | POA: Diagnosis not present
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
## 2023-08-08 ENCOUNTER — Encounter: Payer: Self-pay | Admitting: Family Medicine

## 2023-08-08 ENCOUNTER — Ambulatory Visit: Admitting: Family Medicine

## 2023-08-08 VITALS — BP 118/76 | HR 68 | Temp 98.5°F | Ht 67.0 in | Wt 181.1 lb

## 2023-08-08 DIAGNOSIS — Z0000 Encounter for general adult medical examination without abnormal findings: Secondary | ICD-10-CM

## 2023-08-08 DIAGNOSIS — F332 Major depressive disorder, recurrent severe without psychotic features: Secondary | ICD-10-CM

## 2023-08-08 DIAGNOSIS — G90A Postural orthostatic tachycardia syndrome (POTS): Secondary | ICD-10-CM | POA: Diagnosis not present

## 2023-08-08 NOTE — Telephone Encounter (Signed)
 Ariel Hale, can you start her FMLA form completion as she has requested and then I will review and sign. Thanks!

## 2023-08-08 NOTE — Assessment & Plan Note (Addendum)
 Will refer to ENDO  per pt request for hormonal evaluation.

## 2023-08-08 NOTE — Progress Notes (Signed)
 Chief Complaint  Patient presents with   Annual Exam   Blood pressure 118/76, pulse 68, temperature 98.5 F (36.9 C), temperature source Oral, height 5\' 7"  (1.702 m), weight 181 lb 2 oz (82.2 kg), SpO2 99%.   History of Present Illness: HPI   The patient is here for annual wellness exam and preventative care.    major recurrent depression/GAD:  Good control on  welllbutrin XL 150 mg daily and sertraline  100 mg daily She has restarted work, she is working intermediate work hours.  Continuing to see counselor.   Hx of POTS.. requests referral to ENDO for testing.  Migraine, some increase in occurrence with increase in depression symtpoms in last several months.  Occuring 3-4 times a month.  Using tylenol  prn.   No recent exercise  Diet: moderate  COVID 19 screen No recent travel or known exposure to COVID19 The patient denies respiratory symptoms of COVID 19 at this time.  The importance of social distancing was discussed today.   Review of Systems  Constitutional:  Negative for chills and fever.  HENT:  Negative for congestion and ear pain.   Eyes:  Negative for pain and redness.  Respiratory:  Negative for cough and shortness of breath.   Cardiovascular:  Negative for chest pain, palpitations and leg swelling.  Gastrointestinal:  Negative for abdominal pain, blood in stool, constipation, diarrhea, nausea and vomiting.  Genitourinary:  Negative for dysuria.  Musculoskeletal:  Negative for falls and myalgias.  Skin:  Negative for rash.  Neurological:  Negative for dizziness.  Psychiatric/Behavioral:  Positive for depression. The patient is nervous/anxious.       Past Medical History:  Diagnosis Date   Chest pain    Head ache    SVD (spontaneous vaginal delivery) 07/25/2018   Syncope    Vasovagal syncope     reports that she has never smoked. She has never used smokeless tobacco. She reports that she does not currently use alcohol after a past usage of about 1.0  standard drink of alcohol per week. She reports that she does not use drugs.   Current Outpatient Medications:    buPROPion  (WELLBUTRIN  XL) 150 MG 24 hr tablet, Take 1 tablet (150 mg total) by mouth daily., Disp: 30 tablet, Rfl: 3   hydrOXYzine  (ATARAX ) 10 MG tablet, Take 1 tablet (10 mg total) by mouth 3 (three) times daily as needed., Disp: 30 tablet, Rfl: 0   levonorgestrel  (MIRENA ) 20 MCG/DAY IUD, 1 each by Intrauterine route once., Disp: , Rfl:    sertraline  (ZOLOFT ) 100 MG tablet, Take 1 tablet (100 mg total) by mouth daily., Disp: 30 tablet, Rfl: 3   Observations/Objective: Blood pressure 100/80, pulse 94, temperature 98.4 F (36.9 C), temperature source Temporal, height 5\' 7"  (1.702 m), weight 161 lb 8 oz (73.3 kg), last menstrual period 01/06/2019, SpO2 98 %, unknown if currently breastfeeding.  Physical Exam Vitals and nursing note reviewed.  Constitutional:      General: She is not in acute distress.    Appearance: Normal appearance. She is well-developed. She is not ill-appearing or toxic-appearing.  HENT:     Head: Normocephalic.     Right Ear: Hearing, tympanic membrane, ear canal and external ear normal.     Left Ear: Hearing, tympanic membrane, ear canal and external ear normal.     Nose: Nose normal.  Eyes:     General: Lids are normal. Lids are everted, no foreign bodies appreciated.     Conjunctiva/sclera: Conjunctivae normal.  Pupils: Pupils are equal, round, and reactive to light.  Neck:     Thyroid : No thyroid  mass or thyromegaly.     Vascular: No carotid bruit.     Trachea: Trachea normal.  Cardiovascular:     Rate and Rhythm: Normal rate and regular rhythm.     Heart sounds: Normal heart sounds, S1 normal and S2 normal. No murmur heard.    No gallop.  Pulmonary:     Effort: Pulmonary effort is normal. No respiratory distress.     Breath sounds: Normal breath sounds. No wheezing, rhonchi or rales.  Abdominal:     General: Bowel sounds are normal. There  is no distension or abdominal bruit.     Palpations: Abdomen is soft. There is no fluid wave or mass.     Tenderness: There is no abdominal tenderness. There is no guarding or rebound.     Hernia: No hernia is present.  Musculoskeletal:     Cervical back: Normal range of motion and neck supple.  Lymphadenopathy:     Cervical: No cervical adenopathy.  Skin:    General: Skin is warm and dry.     Findings: No rash.  Neurological:     Mental Status: She is alert.     Cranial Nerves: No cranial nerve deficit.     Sensory: No sensory deficit.  Psychiatric:        Mood and Affect: Mood is depressed. Mood is not anxious. Affect is flat.        Speech: Speech is delayed.        Behavior: Behavior is slowed and withdrawn. Behavior is cooperative.        Cognition and Memory: Cognition normal.        Judgment: Judgment normal.      Assessment and Plan   The patient's preventative maintenance and recommended screening tests for an annual wellness exam were reviewed in full today. Brought up to date unless services declined.  Counselled on the importance of diet, exercise, and its role in overall health and mortality. The patient's FH and SH was reviewed, including their home life, tobacco status, and drug and alcohol status.   Vaccines: TDAP likely given when pregnant in 2022 Pap/DVE:  Per GYN Dr. Arcola Kocher Mammo:  No early family history of breast cancer   Colon: no early family history of colon cancer Smoking Status: none ETOH/ drug use: occ/none   POTS (postural orthostatic tachycardia syndrome) Will refer to ENDO  per pt request for hormonal evaluation.  Major depression, recurrent Chronic, significant improvement with sertraline  100 mg daily, Wellbutrin  150 mg XL p.o. daily. She is doing well seeing the therapist and is learning some coping strategies.  She has successfully returned to work at a part-time schedule.  She is having some anxiety regarding increasing the hours of  work.  Will extend this for a period longer until she feels ready to return full-time.     Herby Lolling, MD

## 2023-08-08 NOTE — Assessment & Plan Note (Addendum)
 Chronic, significant improvement with sertraline  100 mg daily, Wellbutrin  150 mg XL p.o. daily. She is doing well seeing the therapist and is learning some coping strategies.  She has successfully returned to work at a part-time schedule.  She is having some anxiety regarding increasing the hours of work.  Will extend this for a period longer until she feels ready to return full-time.

## 2023-08-09 DIAGNOSIS — Z0279 Encounter for issue of other medical certificate: Secondary | ICD-10-CM

## 2023-08-09 NOTE — Telephone Encounter (Signed)
 Completed forms received. Copy sent to scan. Original form and copy left at the front desk for the patient to pick up. Reached out to patient via Mychart to let her know the forms are ready to pick up at the office at her convenience.

## 2023-08-09 NOTE — Telephone Encounter (Addendum)
 FMLA forms reflecting day/hours needed udated as requested. Forms placed in providers box for review.

## 2023-08-13 ENCOUNTER — Encounter: Payer: Self-pay | Admitting: Licensed Clinical Social Worker

## 2023-08-13 ENCOUNTER — Ambulatory Visit (INDEPENDENT_AMBULATORY_CARE_PROVIDER_SITE_OTHER): Admitting: Licensed Clinical Social Worker

## 2023-08-13 DIAGNOSIS — F411 Generalized anxiety disorder: Secondary | ICD-10-CM | POA: Diagnosis not present

## 2023-08-13 NOTE — Progress Notes (Signed)
 [facility] Counselor/Therapist Progress Note  Patient ID: Ariel Hale, MRN: [id number]    Date: 08/13/23  Time Spent: 11:05  am - 12:08 pm : 63 Minutes  Treatment Type: Individual Therapy.  Reported Symptoms: feeling afraid of everything  Mental Status Exam: Appearance:  Casual     Behavior: Sharing  Motor: Normal  Speech/Language:  Normal Rate  Affect: Appropriate  Mood: normal  Thought process: goal directed  Thought content:   WNL  Sensory/Perceptual disturbances:     Orientation: oriented to person, place, and time/date  Attention: Good  Concentration: Good  Memory: WNL  Fund of knowledge:  Good  Insight:   Good  Judgment:  Good  Impulse Control: Good   Risk Assessment: Danger to Self:  No Self-injurious Behavior: No Danger to Others: No Duty to Warn:no Physical Aggression / Violence:No  Access to Firearms a concern: No  Gang Involvement:No   Subjective:   Ariel Hale participated in the session, in person in the office with the therapist, and consented to treatment Ariel Hale reviewed the events of the past week.      We reviewed numerous treatment approaches including CBT and Solution focused therapy. Psych-education regarding the Ariel Hale's diagnosis of No diagnosis found. was provided during the session. We discussed Ariel Hale's goals treatment goals which include setting goal of identifying triggers/glimmers, fears and concerns in order to begin to create boundaries. Ariel Hale provided verbal approval of the treatment plan.    Interventions: Psycho-education & Goal Setting.   Diagnosis:   GAD  Psychiatric Treatment: No , N/A  Treatment Plan:  Client Abilities/Strengths Ariel Hale is committed to sessions.    Support System: Family and Friends and Patent attorney Preferences In person  Client Statement of Needs Ariel Hale would like to focus on creating and setting boundaries in  her marriage.     Treatment Level Biweekly  Symptoms  Guilt   (Status: maintained) Shame/Disappointment   (Status: maintained)  Goals:   Ariel Hale agreed to setting goal of identifying triggers/glimmers, fears and concerns in order to begin to create boundaries.    Treatment plan signed and available on s-drive:  Yes    Target Date: 09/24/23 Frequency: Biweekly  Progress: 0 Modality: individual    Therapist will provide referrals for additional resources as appropriate.  Therapist will provide psycho-education regarding Ariel Hale's diagnosis and corresponding treatment approaches and interventions. Licensed Clinical Mental Health Counselor, Ariel Hale, [facility], will support the patient's ability to achieve the goals identified. will employ CBT, BA, Problem-solving, Solution Focused, Mindfulness,  coping skills, & other evidenced-based practices will be used to promote progress towards healthy functioning to help manage decrease symptoms associated with her diagnosis.   Reduce overall level, frequency, and intensity of the feelings of depression, anxiety and panic evidenced by increased self awareness, reduced fear and better decision making.   Verbally express understanding of the relationship between feelings of depression, anxiety and their impact on thinking patterns and behaviors. Verbalize an understanding of the role that distorted thinking plays in creating fears, excessive worry, and ruminations.  Ariel Hale participated in the creation of the treatment plan)    Darin Arndt, LCMHC

## 2023-08-13 NOTE — Telephone Encounter (Signed)
 Patient picked up forms.

## 2023-08-14 ENCOUNTER — Encounter: Payer: Self-pay | Admitting: *Deleted

## 2023-08-15 NOTE — Telephone Encounter (Signed)
   Pt came in and stated that her FMLA Paper work was not done right    Patient dropped off document FMLA, to be filled out by provider. Patient requested to send it back via Call Patient to pick up within 5-days. Document is located in providers tray at front office.Please advise at Mobile [phone] (mobile)

## 2023-08-16 NOTE — Telephone Encounter (Addendum)
 FMLA forms received to update end date and time of day needed off. Placed in providers box for review.

## 2023-08-16 NOTE — Telephone Encounter (Signed)
 Received updated FMLA forms. Updated forms sent to scan. Spoke with the patient to let her know that forms were ready to pick up at the front desk.

## 2023-08-20 ENCOUNTER — Ambulatory Visit: Admitting: Licensed Clinical Social Worker

## 2023-08-20 ENCOUNTER — Encounter: Payer: Self-pay | Admitting: Licensed Clinical Social Worker

## 2023-08-20 ENCOUNTER — Ambulatory Visit (INDEPENDENT_AMBULATORY_CARE_PROVIDER_SITE_OTHER): Admitting: Licensed Clinical Social Worker

## 2023-08-20 DIAGNOSIS — F411 Generalized anxiety disorder: Secondary | ICD-10-CM

## 2023-08-20 DIAGNOSIS — F331 Major depressive disorder, recurrent, moderate: Secondary | ICD-10-CM
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
## 2023-08-22 ENCOUNTER — Telehealth: Payer: Self-pay | Admitting: Family Medicine

## 2023-08-22 NOTE — Telephone Encounter (Signed)
 Copied from CRM [id number]. Topic: General - Other >> Aug 22, 2023  8:57 AM Kita Perish H wrote: Reason for CRM: Patient is calling to obtain a release to work form effective 08/22/2023 with no restrictions.  [name] [phone]

## 2023-08-22 NOTE — Telephone Encounter (Signed)
 Work note written as requested and sent to [employer].

## 2023-08-27 ENCOUNTER — Ambulatory Visit: Admitting: Licensed Clinical Social Worker

## 2023-08-30 ENCOUNTER — Ambulatory Visit: Admitting: Family Medicine

## 2023-09-03 ENCOUNTER — Telehealth: Payer: Self-pay | Admitting: Family Medicine

## 2023-09-03 ENCOUNTER — Ambulatory Visit: Admitting: Licensed Clinical Social Worker

## 2023-09-03 ENCOUNTER — Encounter: Payer: Self-pay | Admitting: Family Medicine

## 2023-09-03 NOTE — Telephone Encounter (Signed)
 This is a very pleasant lady who has recently been struggling with depression.  I have no concern about her transferring care.

## 2023-09-03 NOTE — Telephone Encounter (Signed)
 Appt scheduled with Dr. De Peru 8/12. Will place on cancellation list in case something opens up sooner for her.

## 2023-09-03 NOTE — Telephone Encounter (Signed)
 Copied from CRM [id number]. Topic: Appointments - Transfer of Care >> Sep 03, 2023 10:04 AM Stanly Early wrote: Pt is requesting to transfer FROM: Randell Bussing Md Bedsole,Amy Pt is requesting to transfer TO: Charlann Confer  Reason for requested transfer: would like to change providers It is the responsibility of the team the patient would like to transfer to (Dr. De Peru,) to reach out to the patient if for any reason this transfer is not acceptable.

## 2023-09-03 NOTE — Telephone Encounter (Signed)
 Copied from CRM [id number]. Topic: Appointments - Transfer of Care >> Sep 03, 2023 10:23 AM Ivette P wrote: Pt is requesting to transfer FROM: [facility] Pt is requesting to transfer TO: [facility] Reason for requested transfer: Clinic is closer to home  It is the responsibility of the team the patient would like to transfer to (Dr. De Peru, [facility]) to reach out to the patient if for any reason this transfer is not acceptable.

## 2023-09-09 NOTE — Telephone Encounter (Signed)
 Patient is requesting new FMLA forms to be completed. Dates requested 5/27-TBD For intermittent.  8hrs 1-2 times a week.  This is for psychiatrists/therapists also trying to get with cardiologist for POTS.   I will reach out to the patient once forms have been completed and ready for pick up.  Placed in providers box for review.

## 2023-09-10 ENCOUNTER — Ambulatory Visit: Admitting: Licensed Clinical Social Worker

## 2023-09-11 ENCOUNTER — Ambulatory Visit: Admitting: Licensed Clinical Social Worker

## 2023-09-11 DIAGNOSIS — F331 Major depressive disorder, recurrent, moderate: Secondary | ICD-10-CM

## 2023-09-11 DIAGNOSIS — F332 Major depressive disorder, recurrent severe without psychotic features: Secondary | ICD-10-CM | POA: Diagnosis not present

## 2023-09-11 DIAGNOSIS — F411 Generalized anxiety disorder: Secondary | ICD-10-CM

## 2023-09-12 DIAGNOSIS — Z0279 Encounter for issue of other medical certificate: Secondary | ICD-10-CM

## 2023-09-13 DIAGNOSIS — Z0279 Encounter for issue of other medical certificate: Secondary | ICD-10-CM

## 2023-09-13 NOTE — Telephone Encounter (Signed)
 Completed forms received. Copy sent to scan. Patient notified via Mychart that forms are ready to pick up at the front desk.
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
## 2023-09-16 ENCOUNTER — Ambulatory Visit (HOSPITAL_COMMUNITY)
Admission: EM | Admit: 2023-09-16 | Discharge: 2023-09-16 | Disposition: A | Discharge status: Home / Self Care | Attending: Psychiatry | Admitting: Psychiatry

## 2023-09-16 ENCOUNTER — Ambulatory Visit: Payer: Self-pay | Admitting: Family Medicine

## 2023-09-16 DIAGNOSIS — F411 Generalized anxiety disorder: Secondary | ICD-10-CM

## 2023-09-16 DIAGNOSIS — F339 Major depressive disorder, recurrent, unspecified: Secondary | ICD-10-CM

## 2023-09-16 NOTE — Discharge Instructions (Addendum)
     Keep your therapist appoint on 09/25/23.  Follow up with your primary care provider as necessary.  Call the crisis hotline if symptoms worsen or If you have suicidal or homicidal thoughts.
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
## 2023-09-16 NOTE — ED Notes (Signed)
 Patient discharged in no acute distress. Belongings returned from orange locker. AVS given and reviewed. Understanding voiced.
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
## 2023-09-16 NOTE — Telephone Encounter (Signed)
 I am going to include Dr. B here.  I think she should have PCP follow-up, too.  Dr. B has openings tomorrow.

## 2023-09-16 NOTE — Telephone Encounter (Signed)
 I have reached out to patient. She started having symptoms little over a week ago. She thought it was the medications (Zoloft  and Wellbutrin ) and didn't think it was helping so she quit taking them. She has tried THC drops in her drink but made her feel flat and that she didn't care about anything. So she has not taken after Wednesday.  She has left home and driving around because she feels home is more of a trigger at this time. I have advised she needs to be evaluated at this time. She is not willing to go to ED but has agreed to go to [facility] walk in. She has agreed to go. I have sent MyChart message with address. She has agreed while on the way there if any changes in her symptoms she will call [phone] or call [phone]. Talked with patient about importance of getting treatment and she has agreed.

## 2023-09-16 NOTE — Telephone Encounter (Signed)
 FYI Only or Action Required?: Action required by provider  Patient was last seen in primary care on 08/08/2023 by Judithann Novas, MD. Called Nurse Triage reporting Panic Attack. Symptoms began a week ago. Interventions attempted: Other: THC drops. Symptoms are: gradually worsening.  Triage Disposition: Call EMS 911 Now  Patient/caregiver understands and will follow disposition?: No, wishes to speak with PCP. This RN notified [facility] of patient refusal of ED disposition.          Copied from CRM [id number]. Topic: Clinical - Red Word Triage >> Sep 16, 2023  8:45 AM Alyse July wrote: Red Word that prompted transfer to Nurse Triage: Ongoing Mental Issues. Reason for Disposition  Violent behavior, or threatening to physically hurt or kill someone  Answer Assessment - Initial Assessment Questions "I am losing my mind"- anxious,"boost of adrenaline" "I think my job has  a lot to do with it" "I am trying to be careful with my words as I feel like someone is going to take me away from my kids" "I am not suicidal but I feel violent" Denies feeling violent towards others but feeling violent towards self Using THC drops in drink to help with jittery and shaky feeling, last used on Thursday Still seeing a therapist- wants a therapist "in her brain" Wants to ask Dr. Cherlyn Cornet to be put on continuous leave from work Denies difficulty breathing "When I am in one of those episodes I feel like a weight is on my chest," feels this way currently Started feeling this way last week and progressively getting worse 10/10 anxiety now Has felt this way before, called this line and was told to go see a therapist by PCP, started on medications, feels like it has not helped  Protocols used: Anxiety and Panic Attack-A-AH

## 2023-09-17 ENCOUNTER — Ambulatory Visit: Admitting: Licensed Clinical Social Worker

## 2023-09-17 NOTE — Telephone Encounter (Signed)
 Ariel Hale is scheduled to see Dr. Cherlyn Cornet on 09/18/2023 at 9:00 am.

## 2023-09-18 ENCOUNTER — Ambulatory Visit (INDEPENDENT_AMBULATORY_CARE_PROVIDER_SITE_OTHER): Admitting: Family Medicine

## 2023-09-18 ENCOUNTER — Encounter: Payer: Self-pay | Admitting: Family Medicine

## 2023-09-18 VITALS — BP 122/72 | HR 99 | Temp 98.5°F | Ht 67.0 in | Wt 187.2 lb

## 2023-09-18 DIAGNOSIS — F332 Major depressive disorder, recurrent severe without psychotic features: Secondary | ICD-10-CM

## 2023-09-18 DIAGNOSIS — F411 Generalized anxiety disorder: Secondary | ICD-10-CM | POA: Diagnosis not present

## 2023-09-18 NOTE — Progress Notes (Signed)
 Patient ID: Ariel Hale, female    DOB: 05-Feb-1991, 33 y.o.   MRN: [id number]  This visit was conducted in person.  BP 122/72   Pulse 99   Temp 98.5 F (36.9 C) (Oral)   Ht 5' 7 (1.702 m)   Wt 187 lb 3.2 oz (84.9 kg)   SpO2 96%   BMI 29.32 kg/m    CC:  Chief Complaint  Patient presents with   FMLA Paperwork     Subjective:   HPI: Ariel Hale is a 33 y.o. 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 Had no sex drive, so this played into why she stopped.  She had returned to work  on intermittent leave and was doing better overall until the last. Few weeks.  Felt attacked at work. She is currently looking for a different job.Ariel Hale applying.  She is had panic and anxiety  with phone calls  from work... cannot focus, concentrate, complete tasks,  all job duties.  3 days ago had a panic attack, pressure on chest...  She went to behavioral health for assessment.. consdier for admission but she requested to leave.  Reviewed notes. NO meds started.  She continues to have  angry , emotional upset and anxiety regarding work situation.  She continue to have memory issues, foggy thinking.   She is  continuing with therapy... likes the therapist she has restarted with Ariel Hale).. has helped her to get organized.   Now seeing a counselor, planning weekly visits. Just started with new counselor yesterday.   Needs leave from 6/9 for next  (120 hours) 3 weeks for remainder of her approved leave... return on 6/30     09/18/2023    9:57 AM 08/08/2023    2:33 PM 07/10/2023    8:50 AM 06/26/2023    9:19 AM  GAD 7 : Generalized Anxiety Score  Nervous, Anxious, on Edge 2 1 3 3   Control/stop worrying 2 1 2 2   Worry too much - different things 2 1 2 2   Trouble relaxing 1 0 1 3  Restless 1 0 0 3  Easily annoyed or irritable  1 2 3   Afraid - awful might  happen 0 0 2 3  Total GAD 7 Score  4 12 19   Anxiety Difficulty Very difficult Somewhat difficult Extremely difficult Extremely difficult        09/18/2023    9:57 AM 08/08/2023    2:32 PM 07/10/2023    8:50 AM  Depression screen PHQ 2/9  Decreased Interest 0 0 0  Down, Depressed, Hopeless 2 0 3  PHQ - 2 Score 2 0 3  Altered sleeping 2 1 0  Tired, decreased energy 2 1 1   Change in appetite 2 0 1  Feeling bad or failure about yourself  1 0 0  Trouble concentrating 1 2 1   Moving slowly or fidgety/restless 0 0 2  Suicidal thoughts 0 0 0  PHQ-9 Score 10 4 8   Difficult doing work/chores Very difficult Somewhat difficult Extremely dIfficult         Relevant past medical, surgical, family and social history reviewed and updated as indicated. Interim medical history since our last visit reviewed. Allergies and medications reviewed and updated. Outpatient Medications Prior to Visit  Medication Sig Dispense Refill   buPROPion  (WELLBUTRIN  XL) 150  MG 24 hr tablet Take 1 tablet (150 mg total) by mouth daily. 30 tablet 3   hydrOXYzine  (ATARAX ) 10 MG tablet Take 1 tablet (10 mg total) by mouth 3 (three) times daily as needed. 30 tablet 0   levonorgestrel  (MIRENA ) 20 MCG/DAY IUD 1 each by Intrauterine route once.     sertraline  (ZOLOFT ) 100 MG tablet Take 1 tablet (100 mg total) by mouth daily. 30 tablet 3   No facility-administered medications prior to visit.     Per HPI unless specifically indicated in ROS section below Review of Systems  Constitutional:  Negative for fatigue and fever.  HENT:  Negative for congestion.   Eyes:  Negative for pain.  Respiratory:  Negative for cough and shortness of breath.   Cardiovascular:  Negative for chest pain, palpitations and leg swelling.  Gastrointestinal:  Negative for abdominal pain.  Genitourinary:  Negative for dysuria and vaginal bleeding.  Musculoskeletal:  Negative for back pain.  Neurological:  Negative for syncope, light-headedness and  headaches.  Psychiatric/Behavioral:  Negative for dysphoric mood.    Objective:  BP 122/72   Pulse 99   Temp 98.5 F (36.9 C) (Oral)   Ht 5' 7 (1.702 m)   Wt 187 lb 3.2 oz (84.9 kg)   SpO2 96%   BMI 29.32 kg/m   Wt Readings from Last 3 Encounters:  09/18/23 187 lb 3.2 oz (84.9 kg)  08/08/23 181 lb 2 oz (82.2 kg)  07/10/23 180 lb (81.6 kg)      Physical Exam Constitutional:      General: She is not in acute distress.    Appearance: Normal appearance. She is well-developed. She is not ill-appearing or toxic-appearing.  HENT:     Head: Normocephalic.     Right Ear: Hearing, tympanic membrane, ear canal and external ear normal. Tympanic membrane is not erythematous, retracted or bulging.     Left Ear: Hearing, tympanic membrane, ear canal and external ear normal. Tympanic membrane is not erythematous, retracted or bulging.     Nose: No mucosal edema or rhinorrhea.     Right Sinus: No maxillary sinus tenderness or frontal sinus tenderness.     Left Sinus: No maxillary sinus tenderness or frontal sinus tenderness.     Mouth/Throat:     Pharynx: Uvula midline.  Eyes:     General: Lids are normal. Lids are everted, no foreign bodies appreciated.     Conjunctiva/sclera: Conjunctivae normal.     Pupils: Pupils are equal, round, and reactive to light.  Neck:     Thyroid : No thyroid  mass or thyromegaly.     Vascular: No carotid bruit.     Trachea: Trachea normal.  Cardiovascular:     Rate and Rhythm: Normal rate and regular rhythm.     Pulses: Normal pulses.     Heart sounds: Normal heart sounds, S1 normal and S2 normal. No murmur heard.    No friction rub. No gallop.  Pulmonary:     Effort: Pulmonary effort is normal. No tachypnea or respiratory distress.     Breath sounds: Normal breath sounds. No decreased breath sounds, wheezing, rhonchi or rales.  Abdominal:     General: Bowel sounds are normal.     Palpations: Abdomen is soft.     Tenderness: There is no abdominal  tenderness.  Musculoskeletal:     Cervical back: Normal range of motion and neck supple.  Skin:    General: Skin is warm and dry.     Findings: No rash.  Neurological:     Mental Status: She is alert.  Psychiatric:        Mood and Affect: Mood is not anxious or depressed. Affect is flat.        Speech: Speech normal.        Behavior: Behavior is withdrawn. Behavior is cooperative.        Thought Content: Thought content normal. Thought content does not include homicidal or suicidal ideation. Thought content does not include homicidal or suicidal plan.        Cognition and Memory: Cognition normal.        Judgment: Judgment normal.       Results for orders placed or performed in visit on 08/01/23  Comprehensive metabolic panel with GFR   Collection Time: 08/01/23  8:59 AM  Result Value Ref Range   Sodium 133 (L) 135 - 145 mEq/L   Potassium 3.9 3.5 - 5.1 mEq/L   Chloride 98 96 - 112 mEq/L   CO2 29 19 - 32 mEq/L   Glucose, Bld 87 70 - 99 mg/dL   BUN 15 6 - 23 mg/dL   Creatinine, Ser 6.04 0.40 - 1.20 mg/dL   Total Bilirubin 0.8 0.2 - 1.2 mg/dL   Alkaline Phosphatase 74 39 - 117 U/L   AST 23 0 - 37 U/L   ALT 22 0 - 35 U/L   Total Protein 7.4 6.0 - 8.3 g/dL   Albumin 4.4 3.5 - 5.2 g/dL   GFR 54.09 >81.19 mL/min   Calcium 9.3 8.4 - 10.5 mg/dL  Lipid panel   Collection Time: 08/01/23  8:59 AM  Result Value Ref Range   Cholesterol 184 0 - 200 mg/dL   Triglycerides 14.7 0.0 - 149.0 mg/dL   HDL 82.95 >62.13 mg/dL   VLDL 08.6 0.0 - 57.8 mg/dL   LDL Cholesterol 469 (H) 0 - 99 mg/dL   Total CHOL/HDL Ratio 4    NonHDL 133.09     Assessment and Plan  Severe episode of recurrent major depressive disorder, without psychotic features (HCC) Assessment & Plan:  Acute worsening of chronic issue.   Poor control  FMLA changed to continuous from 6/9 to 10/07/2023.  Restart wellbutrin .  Continue counseling.  Consider referral to psychiatry... pt will let me know if she wants to move  forward with this.   GAD (generalized anxiety disorder) Assessment & Plan:  Chronic poor control.        No follow-ups on file.   Herby Lolling, MD

## 2023-09-18 NOTE — Patient Instructions (Signed)
 Restart Wellbutrin  XL. 150mg . Continue counseling.

## 2023-09-18 NOTE — Assessment & Plan Note (Signed)
 Acute worsening of chronic issue.   Poor control  FMLA changed to continuous from 6/9 to 10/07/2023.  Restart wellbutrin .  Continue counseling.  Consider referral to psychiatry... pt will let me know if she wants to move forward with this.

## 2023-09-18 NOTE — Assessment & Plan Note (Signed)
Chronic poor control ?

## 2023-09-23 ENCOUNTER — Telehealth: Payer: Self-pay

## 2023-09-23 NOTE — Telephone Encounter (Signed)
 Mental health verification form received for provider to fill out. Form put in providers box for review.

## 2023-09-24 ENCOUNTER — Ambulatory Visit: Admitting: Licensed Clinical Social Worker

## 2023-09-25 ENCOUNTER — Ambulatory Visit: Admitting: Licensed Clinical Social Worker

## 2023-09-26 ENCOUNTER — Telehealth: Payer: Self-pay | Admitting: Family Medicine

## 2023-09-26 DIAGNOSIS — E559 Vitamin D deficiency, unspecified: Secondary | ICD-10-CM

## 2023-09-26 NOTE — Telephone Encounter (Signed)
 Copied from CRM [id number]. Topic: General - Other >> Sep 26, 2023 10:08 AM Kita Perish H wrote: Reason for CRM: [employer] calling to just status of FMLA forms for patient, states she will be leaving the office for a week and wanted to follow up before she leaves, please reach out.  [employer] [phone] Ext [phone]

## 2023-09-26 NOTE — Telephone Encounter (Signed)
 Okay to provide requested work release note.

## 2023-10-01 ENCOUNTER — Ambulatory Visit: Admitting: Licensed Clinical Social Worker

## 2023-10-02 NOTE — Telephone Encounter (Signed)
 Verification form received from provider and faxed to [facility] at [phone] Copy sent to scan. Pt notified copy was ready for her to pick up at the front desk.

## 2023-10-03 ENCOUNTER — Ambulatory Visit: Admitting: Licensed Clinical Social Worker

## 2023-10-09 ENCOUNTER — Ambulatory Visit: Admitting: Licensed Clinical Social Worker

## 2023-10-21 ENCOUNTER — Ambulatory Visit (INDEPENDENT_AMBULATORY_CARE_PROVIDER_SITE_OTHER): Admitting: Licensed Clinical Social Worker

## 2023-10-21 DIAGNOSIS — F411 Generalized anxiety disorder: Secondary | ICD-10-CM

## 2023-10-21 DIAGNOSIS — F332 Major depressive disorder, recurrent severe without psychotic features: Secondary | ICD-10-CM | POA: Diagnosis not present

## 2023-10-21 DIAGNOSIS — F331 Major depressive disorder, recurrent, moderate: Secondary | ICD-10-CM

## 2023-10-21 NOTE — Progress Notes (Signed)
 [facility] Counselor/Therapist Progress Note  Patient ID: Ariel Hale, MRN: [id number]    Date: 10/21/23  Time Spent: 0801  am - 0900 am : 59 Minutes  Treatment Type: Individual Therapy.  Reported Symptoms: Patient reports she has a lot going on at home. Reports a panic attack on February 6th of 2025. Patient has been out of work since then. She has 2 children at home and a marriage that at times can be a struggle but is improving. Patient reports anxiety and stress and feeling that she doesn't know how to handle people and stress at work and all the meetings required by new management.   Mental Status Exam: Appearance:  Casual     Behavior: Appropriate  Motor: Normal  Speech/Language:  Clear and Coherent  Affect: Appropriate  Mood: anxious  Thought process: normal  Thought content:   WNL  Sensory/Perceptual disturbances:   WNL  Orientation: oriented to person, place, time/date, situation, day of week, month of year, and year  Attention: Good  Concentration: Good  Memory: WNL  Fund of knowledge:  Good  Insight:   Good  Judgment:  Good  Impulse Control: Good    Risk Assessment: Danger to Self:  No Self-injurious Behavior: No Danger to Others: No Duty to Warn:no Physical Aggression / Violence:No  Access to Firearms a concern: No  Gang Involvement:No    Subjective:    Ariel Hale participated from office, located at [employer] with Clinician present. [name] consented to treatment.    Ariel Hale presented for her session on time and eager to engage. Patient shared that since we last met she has been back to work and then out again and then now she is back. She reports having a conversation with her boss who was her friend before becoming the boss. 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 Patient reports not knowing what she is doing different and  remaining stressed and uncomfortable with her job. Patient has been at her job for 10 years and states she is concerned about leaving and finding something else but has put in applications.   Clinician provided support and encouragement via active listening and verbal feedback. Clinician processed with patient her feelings of hurt and confusion. Clinician also discussed with patient the idea of sending an email requesting feedback on specific areas where improvement is needed so she can begin to focus on those areas.  Ariel Hale was pleasant and cooperative and fully engaged in discussion. Ariel Hale agreed to speak with her supervisor and find specific areas in need of improvement. Patient will continue to utilize coping skills to assist with decreasing her symptoms. Ariel Hale will continue to engage in bi weekly therapy sessions. Treatment planning will be reviewed by 06/19/2024.   Interventions: Cognitive Behavioral Therapy, Motivational Interviewing and psycho education. Clinician conducted session in person at clinician's office at [facility]. Reviewed events since last session. Assessed patient's mood since last session and current mood. Clinician reviewed diagnoses and treatment recommendations. Provided psycho education related to diagnoses and treatment.     Diagnosis: Severe episode of recurrent major Depressive Disorder without psychotic features, Generalized Anxiety Disorder    Damien Junk MSW, LCSW/DATE 10/21/2023

## 2023-10-25 ENCOUNTER — Telehealth: Payer: Self-pay | Admitting: *Deleted

## 2023-10-25 NOTE — Telephone Encounter (Signed)
 Copied from CRM [id number]. Topic: Appointments - Appointment Scheduling >> Oct 25, 2023  9:51 AM Rosina BIRCH wrote: Patient/patient representative is calling to schedule an appointment. Refer to attachments for appointment information.   Patient called stating she need an examination done for a new job and immunization done also before July 28. Patient has a paper that need be signed by her doctor. Patient does not know if she can see someone else CB [phone]

## 2023-10-31 ENCOUNTER — Other Ambulatory Visit

## 2023-10-31 ENCOUNTER — Telehealth: Payer: Self-pay | Admitting: Family Medicine

## 2023-10-31 ENCOUNTER — Other Ambulatory Visit: Payer: Self-pay | Admitting: Family Medicine

## 2023-10-31 DIAGNOSIS — Z0279 Encounter for issue of other medical certificate: Secondary | ICD-10-CM

## 2023-10-31 DIAGNOSIS — Z111 Encounter for screening for respiratory tuberculosis: Secondary | ICD-10-CM

## 2023-10-31 NOTE — Telephone Encounter (Signed)
 Patient dropped off paperwork to be filled out by provider for employment. She also will need a TB skin test, can we please put an order to get patient scheduled.  Please call when order is in.

## 2023-10-31 NOTE — Telephone Encounter (Addendum)
 Spoke with Ariel Hale and gave her the options for PPD or Quantiferon TB Gold blood test.  Patient would prefer blood work.  She will come in today at 12:15 pm to labs.  Future orders in Epic.   Health Examination Certificate placed in Dr. Avelina office in box to complete next week upon her return to office.

## 2023-11-03 LAB — QUANTIFERON-TB GOLD PLUS
Mitogen-NIL: >10 [IU]/mL
NIL: 0.03 [IU]/mL
QuantiFERON-TB Gold Plus: NEGATIVE
TB1-NIL: 0.01 [IU]/mL
TB2-NIL: 0 [IU]/mL

## 2023-11-04 ENCOUNTER — Ambulatory Visit: Admitting: Licensed Clinical Social Worker

## 2023-11-05 ENCOUNTER — Ambulatory Visit: Payer: Self-pay | Admitting: Family Medicine

## 2023-11-05 NOTE — Telephone Encounter (Signed)
Patient picked up ppwk.

## 2023-11-05 NOTE — Telephone Encounter (Signed)
 Jeff notified that paperwork is ready to be picked up at the front desk.

## 2023-11-19 ENCOUNTER — Encounter (HOSPITAL_BASED_OUTPATIENT_CLINIC_OR_DEPARTMENT_OTHER): Payer: Self-pay | Admitting: Family Medicine

## 2023-11-19 ENCOUNTER — Ambulatory Visit (INDEPENDENT_AMBULATORY_CARE_PROVIDER_SITE_OTHER): Admitting: Family Medicine

## 2023-11-19 VITALS — BP 116/78 | HR 77 | Temp 98.9°F | Resp 18 | Ht 67.0 in | Wt 194.0 lb

## 2023-11-19 DIAGNOSIS — G90A Postural orthostatic tachycardia syndrome (POTS): Secondary | ICD-10-CM | POA: Diagnosis not present

## 2023-11-19 DIAGNOSIS — Z0000 Encounter for general adult medical examination without abnormal findings: Secondary | ICD-10-CM

## 2023-11-19 DIAGNOSIS — E559 Vitamin D deficiency, unspecified: Secondary | ICD-10-CM | POA: Diagnosis not present

## 2023-11-19 DIAGNOSIS — F419 Anxiety disorder, unspecified: Secondary | ICD-10-CM

## 2023-11-19 DIAGNOSIS — E7800 Pure hypercholesterolemia, unspecified: Secondary | ICD-10-CM | POA: Insufficient documentation

## 2023-11-19 DIAGNOSIS — F32A Depression, unspecified: Secondary | ICD-10-CM | POA: Insufficient documentation

## 2023-11-19 NOTE — Assessment & Plan Note (Signed)
 Noted on prior labs, reviewed today, reviewed general recommendations, discussed lifestyle modifications.  We can plan to recheck this with labs for next physical

## 2023-11-19 NOTE — Assessment & Plan Note (Signed)
 At present, patient feels that symptoms are well-controlled/stable.  We reviewed options and she would prefer to continue with no medications currently and with counseling/therapy services which she presently has arranged.  I do feel that this would be fine, did discuss that if any worsening does occur, would recommend returning to the office for further evaluation.  We can consider starting alternative medications as she felt that most recent ones were not beneficial.

## 2023-11-19 NOTE — Progress Notes (Signed)
 New Patient Office Visit  Subjective   Patient ID: Ariel Hale, female    DOB: 04/18/1990  Age: 33 y.o. MRN: [id number]  CC:  Chief Complaint  Patient presents with   Establish Care   Pots    Would like more information to understanding diagnosis.     HPI [name] presents to establish care Last PCP - Dr. Avelina Accompanied to office today by her mother  Anxiety and depression: not taking currently - was taking sertraline  and bupropion  most recently, hydroxyzine  as needed. Did stop these as she felt that they did not make her feel better and was noting some side effects with medications. She has been beef thyroid  pill and other minerals and supplements - obtaining through ancestral supplements website.  POTS: reports being diagnosed in 2017 - through [facility], had abnormal tilt table testing. Was on Florinef, atenolol in the past per cardiology records. Has seen Dr. Fernande locally, this was many years ago. Took salt tablets at one point. Feels symptoms are fairly stable at present. History of presyncopal symptoms. Has had issues with syncopal episodes in the past, residual symptoms following these episodes.  Patient is originally from [place]. 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 She enjoys being in the pool, reading, true crime shows.  Outpatient Encounter Medications as of 11/19/2023  Medication Sig   levonorgestrel  (MIRENA ) 20 MCG/DAY IUD 1 each by Intrauterine route once.   buPROPion  (WELLBUTRIN  XL) 150 MG 24 hr tablet Take 1 tablet (150 mg total) by mouth daily. (Patient not taking: Reported on 11/19/2023)   hydrOXYzine  (ATARAX ) 10 MG tablet Take 1 tablet (10 mg total) by mouth 3 (three) times daily as needed. (Patient not taking: Reported on 11/19/2023)   sertraline  (ZOLOFT ) 100 MG tablet Take 1 tablet (100 mg total) by mouth daily. (Patient not taking: Reported on 11/19/2023)   No facility-administered encounter medications on file  as of 11/19/2023.    Past Medical History:  Diagnosis Date   Chest pain    Head ache    Postural orthostatic tachycardia syndrome (POTS)    SVD (spontaneous vaginal delivery) 07/25/2018   Syncope    Vasovagal syncope     Past Surgical History:  Procedure Laterality Date   NO PAST SURGERIES      Family History  Problem Relation Age of Onset   Osteoporosis Maternal Grandmother    Dementia Maternal Grandmother    Diabetes Maternal Grandfather    Heart disease Paternal Grandmother    Thyroid  disease Maternal Aunt     Social History   Socioeconomic History   Marital status: Married    Spouse name: Not on file   Number of children: Not on file   Years of education: Not on file   Highest education level: Associate degree: academic program  Occupational History   Not on file  Tobacco Use   Smoking status: Never    Passive exposure: Never   Smokeless tobacco: Never  Vaping Use   Vaping status: Never Used  Substance and Sexual Activity   Alcohol use: Not Currently    Alcohol/week: 1.0 standard drink of alcohol    Types: 1 Standard drinks or equivalent per week   Drug use: No   Sexual activity: Yes    Partners: Male    Birth control/protection: None    Comment: no condoms  Other Topics Concern   Not on file  Social History Narrative   Not on file  Social Drivers of Health   Financial Resource Strain: High Risk (05/15/2023)   Overall Financial Resource Strain (CARDIA)    Difficulty of Paying Living Expenses: Hard  Food Insecurity: Food Insecurity Present (05/15/2023)   Hunger Vital Sign    Worried About Running Out of Food in the Last Year: Sometimes true    Ran Out of Food in the Last Year: Never true  Transportation Needs: No Transportation Needs (05/15/2023)   PRAPARE - Administrator, Civil Service (Medical): No    Lack of Transportation (Non-Medical): No  Physical Activity: Sufficiently Active (05/15/2023)   Exercise Vital Sign    Days of Exercise  per Week: 2 days    Minutes of Exercise per Session: 120 min  Stress: Stress Concern Present (05/15/2023)   [employer] of Occupational Health - Occupational Stress Questionnaire    Feeling of Stress : Very much  Social Connections: Moderately Integrated (05/15/2023)   Social Connection and Isolation Panel    Frequency of Communication with Friends and Family: More than three times a week    Frequency of Social Gatherings with Friends and Family: Once a week    Attends Religious Services: 1 to 4 times per year    Active Member of [employer] or Organizations: No    Attends Engineer, structural: Not on file    Marital Status: Married  Catering manager Violence: Not At Risk (09/13/2020)   Humiliation, Afraid, Rape, and Kick questionnaire    Fear of Current or Ex-Partner: No    Emotionally Abused: No    Physically Abused: No    Sexually Abused: No    Objective   BP 116/78   Pulse 77   Temp 98.9 F (37.2 C) (Oral)   Resp 18   Ht 5' 7 (1.702 m)   Wt 194 lb (88 kg)   LMP  (LMP Unknown)   SpO2 100%   BMI 30.38 kg/m   Physical Exam  33 year old female in no acute distress Cardiovascular exam with regular rate and rhythm Lungs clear to auscultation bilaterally  Assessment & Plan:   Anxiety and depression Assessment & Plan: At present, patient feels that symptoms are well-controlled/stable.  We reviewed options and she would prefer to continue with no medications currently and with counseling/therapy services which she presently has arranged.  I do feel that this would be fine, did discuss that if any worsening does occur, would recommend returning to the office for further evaluation.  We can consider starting alternative medications as she felt that most recent ones were not beneficial.   POTS (postural orthostatic tachycardia syndrome) Assessment & Plan: She presently feels that she is stable in this regard, will occasionally have intermittent symptoms, but does not  feel that they are significantly impactful to functioning.  We did discuss considerations including possible referral to reestablish with cardiology locally.  She would prefer to hold off on this for now given that she feels she is doing fairly well.  Did discuss that if any worsening does occur, to let us  know and we can place referral at any time   Vitamin D  deficiency Assessment & Plan: Noted on prior labs, has not had recent check.  We can plan to assess vitamin D  with next blood draw  Orders: -     VITAMIN D  25 Hydroxy (Vit-D Deficiency, Fractures); Future  Wellness examination -     CBC with Differential/Platelet; Future -     Comprehensive metabolic panel with GFR; Future -  Hemoglobin A1c; Future -     Lipid panel; Future -     TSH Rfx on Abnormal to Free T4; Future -     VITAMIN D  25 Hydroxy (Vit-D Deficiency, Fractures); Future  Elevated LDL cholesterol level Assessment & Plan: Noted on prior labs, reviewed today, reviewed general recommendations, discussed lifestyle modifications.  We can plan to recheck this with labs for next physical   Return in about 9 months (around 08/18/2024) for CPE with fasting labs 1 week prior.    ___________________________________________ Ahman Dugdale de [place], MD, ABFM, CAQSM Primary Care and Sports Medicine [facility]

## 2023-11-19 NOTE — Assessment & Plan Note (Signed)
 She presently feels that she is stable in this regard, will occasionally have intermittent symptoms, but does not feel that they are significantly impactful to functioning.  We did discuss considerations including possible referral to reestablish with cardiology locally.  She would prefer to hold off on this for now given that she feels she is doing fairly well.  Did discuss that if any worsening does occur, to let us  know and we can place referral at any time

## 2023-11-19 NOTE — Assessment & Plan Note (Signed)
 Noted on prior labs, has not had recent check.  We can plan to assess vitamin D  with next blood draw

## 2024-01-19 ENCOUNTER — Emergency Department (HOSPITAL_COMMUNITY)
Admission: EM | Admit: 2024-01-19 | Discharge: 2024-01-19 | Disposition: A | Discharge status: Home / Self Care | Source: Ambulatory Visit | Attending: Emergency Medicine | Admitting: Emergency Medicine

## 2024-01-19 DIAGNOSIS — R55 Syncope and collapse: Secondary | ICD-10-CM | POA: Diagnosis present

## 2024-01-19 DIAGNOSIS — R112 Nausea with vomiting, unspecified: Secondary | ICD-10-CM | POA: Diagnosis not present

## 2024-01-19 MED ORDER — ONDANSETRON 4 MG PO TBDP: 4.0000 mg | ORAL_TABLET | Freq: Three times a day (TID) | ORAL | 0 refills | Status: AC | PRN

## 2024-01-19 MED ORDER — ONDANSETRON HCL 4 MG/2ML IJ SOLN
4.0000 mg | Freq: Once | INTRAMUSCULAR | Status: AC
  Administered 2024-01-19: 4 mg via INTRAVENOUS
  Filled 2024-01-19: qty 2

## 2024-01-19 MED ORDER — LACTATED RINGERS IV BOLUS
1000.0000 mL | Freq: Once | INTRAVENOUS | Status: AC
  Administered 2024-01-19: 1000 mL via INTRAVENOUS

## 2024-01-19 NOTE — ED Notes (Signed)
 Pt given crackers and ginger ale per pt request. Pt tolerated well. Denies N/V.

## 2024-01-19 NOTE — ED Notes (Signed)
 Pt states she feels better. Skin color appears back to normal. Pt changed into clean clothes.

## 2024-01-19 NOTE — ED Triage Notes (Addendum)
 Pt experienced loss of consciousness and vomiting upon discharge for pediatric son. Prior to LOC pt stated she felt like she was going to pass out and felt nauseous. Episode witnessed by staff in room, Dr Raford called to bedside. Mother reports hx POTS.

## 2024-01-19 NOTE — Discharge Instructions (Addendum)
 Whenever you feel dizzy, you should lie down immediately until the dizziness passes.  If you tried to stay upright, you are likely to pass out.

## 2024-01-19 NOTE — ED Provider Notes (Signed)
[place] EMERGENCY DEPARTMENT AT [facility] Provider Note   CSN: [id number] Arrival date & time: 01/19/24  0021     Patient presents with: No chief complaint on file.   Ariel Hale is a 33 y.o. female.   The history is provided by the patient.   She has history of POTS, vasovagal syncope and had a syncopal episode.  She was here with her son who had a forearm fracture treated with splint application.  As her son was being discharged, she started feeling lightheaded and clammy and did vomit.  There was brief loss of consciousness.  She is feeling better now.    Prior to Admission medications   Medication Sig Start Date End Date Taking? Authorizing Provider  buPROPion  (WELLBUTRIN  XL) 150 MG 24 hr tablet Take 1 tablet (150 mg total) by mouth daily. Patient not taking: Reported on 11/19/2023 06/26/23   Avelina Greig BRAVO, MD  hydrOXYzine  (ATARAX ) 10 MG tablet Take 1 tablet (10 mg total) by mouth 3 (three) times daily as needed. Patient not taking: Reported on 11/19/2023 05/16/23   Avelina Greig BRAVO, MD  levonorgestrel  (MIRENA ) 20 MCG/DAY IUD 1 each by Intrauterine route once.    [provider]  sertraline  (ZOLOFT ) 100 MG tablet Take 1 tablet (100 mg total) by mouth daily. Patient not taking: Reported on 11/19/2023 06/14/23   Avelina Greig BRAVO, MD    Allergies: Hydrocodone-acetaminophen , Penicillins, and Vicodin [hydrocodone-acetaminophen ]    Review of Systems  All other systems reviewed and are negative.   Updated Vital Signs BP 103/66 (BP Location: Right Arm)   Pulse 62   Temp 97.8 F (36.6 C)   Resp 18   Ht 5' 7 (1.702 m)   Wt 86.2 kg   SpO2 99%   BMI 29.76 kg/m   Physical Exam Vitals and nursing note reviewed.   33 year old female, resting comfortably and in no acute distress. Vital signs are normal. Oxygen saturation is 99%, which is normal. Head is normocephalic and atraumatic. PERRLA, EOMI.  Lungs are clear without rales, wheezes, or  rhonchi. Chest is nontender. Heart has regular rate and rhythm without murmur. Abdomen is soft, flat, nontender. Skin is warm and dry without rash. Neurologic: Mental status is normal, cranial nerves are intact, moves all extremities equally.  (all labs ordered are listed, but only abnormal results are displayed) Labs Reviewed - No data to display  EKG: None  Radiology: No results found.   Procedures   Medications Ordered in the ED - No data to display                                  Medical Decision Making Risk Prescription drug management.   Syncopal episode which appears to be vasovagal syncope.  No red flags to suggest more serious pathology.  I have ordered ondansetron  for nausea and some IV fluids.  He feels better following above-noted treatment.  I am discharging her with a prescription for ondansetron  oral dissolving tablet.     Final diagnoses:  Vasovagal syncope  Nausea and vomiting, unspecified vomiting type    ED Discharge Orders          Ordered    ondansetron  (ZOFRAN -ODT) 4 MG disintegrating tablet  Every 8 hours PRN        01/19/24 0133               Raford Lenis, MD 01/19/24  0134  

## 2024-01-21 ENCOUNTER — Ambulatory Visit (HOSPITAL_BASED_OUTPATIENT_CLINIC_OR_DEPARTMENT_OTHER): Admitting: Family Medicine

## 2024-01-21 ENCOUNTER — Encounter (HOSPITAL_BASED_OUTPATIENT_CLINIC_OR_DEPARTMENT_OTHER): Payer: Self-pay | Admitting: Family Medicine

## 2024-01-21 VITALS — BP 123/88 | HR 81 | Ht 67.0 in | Wt 192.8 lb

## 2024-01-21 DIAGNOSIS — R55 Syncope and collapse: Secondary | ICD-10-CM | POA: Diagnosis not present

## 2024-01-21 NOTE — Progress Notes (Signed)
    Procedures performed today:    None.  Independent interpretation of notes and tests performed by another provider:   None.  Brief History, Exam, Impression, and Recommendations:    BP 123/88 (BP Location: Left Arm, Patient Position: Sitting, Cuff Size: Normal)   Pulse 81   Ht 5' 7 (1.702 m)   Wt 192 lb 12.8 oz (87.5 kg)   SpO2 100%   BMI 30.20 kg/m   Patient was in ED for son who had fractured his arm. When leaving, patient felt off, vomited, passed out. Also reports losing control of her bladder. She responded to conservative measures in ED. Was discharged with Zofran  to use as needed. No further episodes since discharge from ED. She continues to have headache now, some dizziness, feels weak. She reports that she has been thinking about whether she is eligible for disability as a result of these symptoms. Chart review does indicate history of migraines, vasovagal episodes.  Has had evaluation with neurology and cardiology when she was younger.  On exam, patient is in no acute distress, vital signs stable.  Cardiovascular exam with regular rate and rhythm, no murmur appreciated.  Lungs clear to auscultation bilaterally.  Normal gait in office today.  Syncope, unspecified syncope type Assessment & Plan: We discussed considerations today.  Given that she has been experiencing acute symptoms that are still ongoing, would proceed with labs for further evaluation and to rule out alternative causes for ongoing symptoms.  Would also recommend further evaluation with neurology given her history.  Referral has been placed today. 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 Because of her recent syncopal episode, she is not wanting to proceed with this. 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 She will let us  know if she needs a letter stating this. We will plan to  follow-up in about 3 to 4 months or sooner as needed  Orders: -     CBC with Differential/Platelet -     Comprehensive metabolic panel with GFR -     Ambulatory referral to Neurology  Return in about 3 months (around 04/22/2024), or if symptoms worsen or fail to improve.  Spent 34 minutes on this patient encounter, including preparation, chart review, face-to-face counseling with patient and coordination of care, and documentation of encounter   ___________________________________________ Azure Barrales de [place], MD, ABFM, [facility] Primary Care and Sports Medicine [facility]

## 2024-01-21 NOTE — Patient Instructions (Signed)
  Medication Instructions:  Your physician recommends that you continue on your current medications as directed. Please refer to the Current Medication list given to you today. --If you need a refill on any your medications before your next appointment, please call your pharmacy first. If no refills are authorized on file call the office.-- Lab Work: Your physician has recommended that you have lab work today: today  If you have labs (blood work) drawn today and your tests are completely normal, you will receive your results via MyChart message OR a phone call from our staff.  Please ensure you check your voicemail in the event that you authorized detailed messages to be left on a delegated number. If you have any lab test that is abnormal or we need to change your treatment, we will call you to review the results.    Follow-Up: Your next appointment:   Your physician recommends that you schedule a follow-up appointment in: 3-4 months follow up with Dr. de [place]  You will receive a text message or e-mail with a link to a survey about your care and experience with [place] today! We would greatly appreciate your feedback!   Thanks for letting us be apart of your health journey!!  Primary Care and Sports Medicine   Dr. Ceasar Mons [place]   We encourage you to activate your patient portal called "MyChart".  Sign up information is provided on this After Visit Summary.  MyChart is used to connect with patients for Virtual Visits (Telemedicine).  Patients are able to view lab/test results, encounter notes, upcoming appointments, etc.  Non-urgent messages can be sent to your provider as well. To learn more about what you can do with MyChart, please visit --  [email or web address].

## 2024-01-21 NOTE — Assessment & Plan Note (Signed)
 We discussed considerations today.  Given that she has been experiencing acute symptoms that are still ongoing, would proceed with labs for further evaluation and to rule out alternative causes for ongoing symptoms.  Would also recommend further evaluation with neurology given her history.  Referral has been placed today. 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 Because of her recent syncopal episode, she is not wanting to proceed with this. 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 She will let us  know if she needs a letter stating this. We will plan to follow-up in about 3 to 4 months or sooner as needed

## 2024-01-22 LAB — CBC WITH DIFFERENTIAL/PLATELET
Basophils Absolute: 0.1 x10E3/uL (ref 0.0–0.2)
Basos: 1 %
EOS (ABSOLUTE): 0.2 x10E3/uL (ref 0.0–0.4)
Eos: 4 %
Hematocrit: 43.5 % (ref 34.0–46.6)
Hemoglobin: 14.1 g/dL (ref 11.1–15.9)
Immature Grans (Abs): 0 x10E3/uL (ref 0.0–0.1)
Immature Granulocytes: 0 %
Lymphocytes Absolute: 2.7 x10E3/uL (ref 0.7–3.1)
Lymphs: 41 %
MCH: 30.7 pg (ref 26.6–33.0)
MCHC: 32.4 g/dL (ref 31.5–35.7)
MCV: 95 fL (ref 79–97)
Monocytes Absolute: 0.6 x10E3/uL (ref 0.1–0.9)
Monocytes: 10 %
Neutrophils Absolute: 3 x10E3/uL (ref 1.4–7.0)
Neutrophils: 44 %
Platelets: 277 x10E3/uL (ref 150–450)
RBC: 4.6 x10E6/uL (ref 3.77–5.28)
RDW: 12.1 % (ref 11.7–15.4)
WBC: 6.6 x10E3/uL (ref 3.4–10.8)

## 2024-01-22 LAB — COMPREHENSIVE METABOLIC PANEL WITH GFR
ALT: 14 IU/L (ref 0–32)
AST: 19 IU/L (ref 0–40)
Albumin: 4.4 g/dL (ref 3.9–4.9)
Alkaline Phosphatase: 74 IU/L (ref 41–116)
BUN/Creatinine Ratio: 10 (ref 9–23)
BUN: 9 mg/dL (ref 6–20)
Bilirubin Total: 0.5 mg/dL (ref 0.0–1.2)
CO2: 22 mmol/L (ref 20–29)
Calcium: 9.4 mg/dL (ref 8.7–10.2)
Chloride: 102 mmol/L (ref 96–106)
Creatinine, Ser: 0.86 mg/dL (ref 0.57–1.00)
Globulin, Total: 2.5 g/dL (ref 1.5–4.5)
Glucose: 79 mg/dL (ref 70–99)
Potassium: 4.1 mmol/L (ref 3.5–5.2)
Sodium: 137 mmol/L (ref 134–144)
Total Protein: 6.9 g/dL (ref 6.0–8.5)
eGFR: 91 mL/min/1.73 (ref >59)

## 2024-01-23 ENCOUNTER — Ambulatory Visit (HOSPITAL_BASED_OUTPATIENT_CLINIC_OR_DEPARTMENT_OTHER): Payer: Self-pay | Admitting: Family Medicine

## 2024-02-06 ENCOUNTER — Telehealth (HOSPITAL_BASED_OUTPATIENT_CLINIC_OR_DEPARTMENT_OTHER): Payer: Self-pay | Admitting: Family Medicine

## 2024-02-06 NOTE — Telephone Encounter (Signed)
 Routing to Dr. [place] for review about the letter pt is requesting.

## 2024-02-06 NOTE — Telephone Encounter (Signed)
 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  Can Dr. Penne write a letter explaining his findings on this matter.  Please advise.

## 2024-02-07 ENCOUNTER — Encounter (HOSPITAL_BASED_OUTPATIENT_CLINIC_OR_DEPARTMENT_OTHER): Payer: Self-pay | Admitting: *Deleted

## 2024-02-07 NOTE — Telephone Encounter (Signed)
 Letter sent to pt's mychart and also sent pt a message letting her know that letter was written.

## 2024-04-07 ENCOUNTER — Ambulatory Visit: Admitting: Neurology

## 2024-04-07 ENCOUNTER — Encounter: Payer: Self-pay | Admitting: Neurology

## 2024-04-07 VITALS — BP 120/84 | HR 87 | Ht 67.0 in | Wt 193.0 lb

## 2024-04-07 DIAGNOSIS — R29818 Other symptoms and signs involving the nervous system: Secondary | ICD-10-CM | POA: Insufficient documentation

## 2024-04-07 DIAGNOSIS — G90A Postural orthostatic tachycardia syndrome (POTS): Secondary | ICD-10-CM

## 2024-04-07 DIAGNOSIS — F324 Major depressive disorder, single episode, in partial remission: Secondary | ICD-10-CM | POA: Insufficient documentation

## 2024-04-07 NOTE — Progress Notes (Addendum)
 [facility]  Provider:  Dr Rafik Koppel Referring Provider: de Cuba, Quintin PARAS, MD Primary Care Physician:  de Cuba, Quintin PARAS, MD  Chief Complaint  Patient presents with   New Patient (Initial Visit)    HPI:  Ariel Hale is a 33 y.o. female and seen here on 04/07/2024 upon referral from Dr. de Cuba for a Consultation/ Evaluation of a syncope ( the description was not typical for a fainting spell at all- see below) and POTS . She had last ha a CT head in her teen-age, underwent tilt table test at [facility] over a decade ago. Had on and off presyncopal episodes.   The patient has been treated by PCP ( was Dr Greig Goodie)  for a long time, but seems not to have seen cardiology.  This patient reports onset of POTS  over 23 years ago.   She is reporting ongoing symptoms but she is less able to quickly recover , there is no way she can just lay-down, now that she is a mother herself.  Fainted at work last month ,: .   She stood and spoke to her boss, she noted cotton mouth and saw the person she spoke to as having a grey complexion, than the image  went black. She fainted after being seated (!) In  October ( 01-19-2024)  occurred another  fainting spell . As she lost awareness, she reported-  she also lost urinary control, and she vomited.  Seen in ED , Dr Raford::   She has history of POTS, vasovagal syncope and had a syncopal episode. She was here with her son who had a forearm fracture treated with splint application. As her son was being discharged, she started feeling lightheaded and clammy and did vomit. There was brief loss of consciousness. She is feeling better now .  She was given ginger ale and crackers to eat.  Once she regained awareness she reports she was shocked to see the mess.  She got discharged under the diagnosis of syncope and given Zofran .   As a teenager she had spells of fainting and LOC with one arm being extended in the air.( ? Of  functional spells) .  These spells have never stopped.   BP - here only measured while seated. (?)  Struggling with depression and anxiety for a long time, severe.   Changed jobs last year, went to therapy.  She felt it wasn't helping /  She discontinued her medications from Dr Avelina.    Degree in primary school teacher, college graduate,  worked first in a trucking company,  later in an insurance company for 10 years,  started last July with the school [address].  None smoker   1 cup of coffee in AM  (this is OK) , hydrates well, her mother than reminded her of her additional daily caffeine  in PM [employer])    Alcohol: drinks sometimes  wine on holidays, 1 glas in 3 months.     Review of Systems: Out of a complete 14 system review, the patient complains of only the following symptoms, and all other reviewed systems are negative.   Severe episode of recurrent major depressive disorder, without psychotic features   Postprandial sleepiness, limbs feel asleep, feels completely out of energy.    Social History   Socioeconomic History   Marital status: Married    Spouse name: Not on file   Number of children: Not on file   Years of education:  Not on file   Highest education level: Associate degree: academic program  Occupational History   Not on file  Tobacco Use   Smoking status: Never    Passive exposure: Never   Smokeless tobacco: Never  Vaping Use   Vaping status: Never Used  Substance and Sexual Activity   Alcohol use: Not Currently    Alcohol/week: 1.0 standard drink of alcohol    Types: 1 Standard drinks or equivalent per week   Drug use: No   Sexual activity: Yes    Partners: Male    Birth control/protection: None    Comment: no condoms  Other Topics Concern   Not on file  Social History Narrative   Not on file   Social Drivers of Health   Tobacco Use: Low Risk (04/07/2024)   Patient History    Smoking Tobacco Use: Never    Smokeless Tobacco Use:  Never    Passive Exposure: Never  Financial Resource Strain: High Risk (05/15/2023)   Overall Financial Resource Strain (CARDIA)    Difficulty of Paying Living Expenses: Hard  Food Insecurity: Food Insecurity Present (05/15/2023)   Hunger Vital Sign    Worried About Running Out of Food in the Last Year: Sometimes true    Ran Out of Food in the Last Year: Never true  Transportation Needs: No Transportation Needs (05/15/2023)   PRAPARE - Administrator, Civil Service (Medical): No    Lack of Transportation (Non-Medical): No  Physical Activity: Sufficiently Active (05/15/2023)   Exercise Vital Sign    Days of Exercise per Week: 2 days    Minutes of Exercise per Session: 120 min  Stress: Stress Concern Present (05/15/2023)   [employer] of Occupational Health - Occupational Stress Questionnaire    Feeling of Stress : Very much  Social Connections: Moderately Integrated (05/15/2023)   Social Connection and Isolation Panel    Frequency of Communication with Friends and Family: More than three times a week    Frequency of Social Gatherings with Friends and Family: Once a week    Attends Religious Services: 1 to 4 times per year    Active Member of [employer] or Organizations: No    Attends Engineer, Structural: Not on file    Marital Status: Married  Catering Manager Violence: Not on file  Depression (PHQ2-9): Low Risk (01/21/2024)   Depression (PHQ2-9)    PHQ-2 Score: 0  Alcohol Screen: Low Risk (05/15/2023)   Alcohol Screen    Last Alcohol Screening Score (AUDIT): 0  Housing: High Risk (05/15/2023)   Housing Stability Vital Sign    Unable to Pay for Housing in the Last Year: Yes    Number of Times Moved in the Last Year: 0    Homeless in the Last Year: No  Utilities: Not on file  Health Literacy: Not on file    Family History  Problem Relation Age of Onset   Osteoporosis Maternal Grandmother    Dementia Maternal Grandmother    Diabetes Maternal Grandfather    Heart  disease Paternal Grandmother    Thyroid  disease Maternal Aunt     Past Medical History:  Diagnosis Date   Chest pain    Head ache    Postural orthostatic tachycardia syndrome (POTS)    SVD (spontaneous vaginal delivery) 07/25/2018   Syncope    Vasovagal syncope     Past Surgical History:  Procedure Laterality Date   NO PAST SURGERIES      Current Outpatient Medications  Medication Sig Dispense Refill   levonorgestrel  (MIRENA ) 20 MCG/DAY IUD 1 each by Intrauterine route once.     ondansetron  (ZOFRAN -ODT) 4 MG disintegrating tablet Take 1 tablet (4 mg total) by mouth every 8 (eight) hours as needed for nausea or vomiting. 20 tablet 0   No current facility-administered medications for this visit.    Allergies as of 04/07/2024 - Review Complete 04/07/2024  Allergen Reaction Noted   Hydrocodone-acetaminophen  Nausea Only 06/08/2008   Penicillins Nausea And Vomiting 08/07/2020   Vicodin [hydrocodone-acetaminophen ] Nausea Only 02/03/2020    Vitals: BP 120/84   Pulse 87   Ht 5' 7 (1.702 m)   Wt 193 lb (87.5 kg)   SpO2 98%   BMI 30.23 kg/m   Physical exam:  General: The patient is awake, alert and appears not in acute distress.  The patient is well groomed. Head: Normocephalic, atraumatic.  Neck is supple.  Neck circumference: Cardiovascular:  Regular rate and palpable peripheral pulse:  Respiratory: clear to auscultation, Skin:  Without evidence of edema, or rash    Neurologic exam : The patient is awake and alert, oriented to place and time.   Memory subjective  described as intact.  There is a normal attention span & concentration ability.  Speech is fluent without  dysarthria, dysphonia or aphasia.  Mood and affect are anxious and demure.   Cranial nerves: Pupils are equal and briskly reactive to light.  Funduscopic exam evidence of pallor or edema.  Extraocular movements  in vertical and horizontal planes intact and without nystagmus. Visual fields by  finger perimetry are intact. Hearing to finger rub intact.  Facial sensation intact to fine touch. Facial motor strength is symmetric and tongue and uvula move midline.  Motor exam:   Normal tone and normal muscle bulk and symmetric normal strength in all extremities. Grip Strength equal  Proximal strength of shoulder muscles and hip flexors was intact .  Sensory:  Fine touch and vibration were tested .  Proprioception was tested in the upper extremities only and was normal.  Coordination: Rapid alternating movements in the fingers/hands were normal.  Finger-to-nose maneuver was tested and showed no evidence of ataxia, dysmetria or tremor.  Gait and station: Patient walked with/ without assistive device .  Core Strength within normal limits.  Stance is stable and of wide/ normal-Not wide based. Tandem gait - astasia and abasia- walking on outside of feet, lateral rim.     She turns with 3 Steps - these are unfragmented.  Romberg testing ; swaying.   Deep tendon reflexes: in the  upper and lower extremities are symmetric and  brisk without Clonus.    Assessment: Total time for face to face interview and examination, for review of  images and laboratory testing, neurophysiology testing and pre-existing records, including out-of -network , was 45 minutes.    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  reviewed labs. normal TSH, normal CBC, CMET.  The latest lab tests were  from 01-2023. (!) no hyponatremia, no anemia, no infection.     Plan:  Treatment plan and additional workup planned after today includes:   Conservative therapy- encourage hydration, slow down when getting up, prepare for emergency food in your pocket or in your handbag,  daily exercise to stabilize BP and HR. Hydration and salty foods are usually improving presyncope outcome.   0) please keep a journal of your spells and associated symptoms. If your psychiatrist feels there is an organic component may do EEG and MRI brain.  1)  no POTS was seen here- no need for beta blocker based on today's exam. This may be reviewed by cardiology after monitoring  2) fainting or nearly fainting frequently but also feeling depressed . By whatever this is caused, she cannot drive a school bus. ( She wanted me to remark on that )    Dear Dr Penne,  I recommend to have your seen by cardiology for her reported AM palpitations, she reports heart rates of 160 bpm in AM captured on her smart watch- .   She may benefit from a cardiac consultation, heart monitor and from exercises, hydration, and may contemplate a beta blocker if there is documented PVCs or SVT.   I see no neurological concern here, I doubt seizures and strongly suspect a psychological back ground to these spells.      The patient's condition requires frequent monitoring and adjustments in the treatment plan, reflecting the ongoing complexity of care.  This provider is the continuing focal point for all needed services for this condition.   Dedra Gores, MD  [facility] Board certified by The [employer] of Sleep Medicine and Diplomate of the [employer] of Sleep Medicine. Board certified In Neurology through the ABPN, Fellow of the [employer] of Neu[employer].

## 2024-04-07 NOTE — Patient Instructions (Addendum)
 Near-Syncope Near-syncope is when you suddenly feel like you might pass out or faint. This may also be called presyncope. During an episode of near-syncope, you may: Feel dizzy, weak, or light-headed. It may feel like the room is spinning. Feel like you may vomit (nauseous). See spots or see all white or all black. Have cold, clammy skin. Feel warm and sweaty. Hear ringing in your ears. This condition is caused by a sudden decrease in blood flow to the brain. This can result from many causes, but most of those causes are not dangerous. However, near-syncope may be a sign of a serious medical problem, so it is important to seek medical care. Follow these instructions at home: Medicines Take over-the-counter and prescription medicines only as told by your doctor. If you are taking blood pressure or heart medicine, get up slowly and spend many minutes getting ready to sit and then stand. This can help with dizziness. Lifestyle Do not drive, use machinery, or play sports until your doctor says it is okay. Do not drink alcohol. Do not smoke or use any products that contain nicotine or tobacco. If you need help quitting, ask your doctor. Avoid hot tubs and saunas. General instructions Be aware of any changes in your symptoms. Talk with your doctor about your symptoms. You may need to have testing to help find the cause. If you start to feel like you might pass out, sit or lie down right away. If sitting, lower your head down between your legs. If lying down, raise (elevate) your feet above the level of your heart. Breathe deeply and steadily. Wait until all of the symptoms are gone. Have someone stay with you until you feel better. Drink enough fluid to keep your pee (urine) pale yellow. Avoid standing for a long time. If you must stand for a long time, do movements such as: Moving your legs. Crossing your legs. Flexing and stretching your leg muscles. Squatting. Keep all follow-up  visits. Contact a doctor if: You continue to have episodes of near fainting. Get help right away if: You pass out or faint. You have any of these symptoms: Fast or uneven heartbeats (palpitations). Pain in your chest, belly, or back. Shortness of breath. You have a seizure. You have a very bad headache. You are confused. You have trouble seeing. You are very weak. You have trouble walking. You are bleeding from your mouth or butt. You have black or tarry poop (stool). These symptoms may be an emergency. Get help right away. Call your local emergency services (911 in the [place] Do not wait to see if the symptoms will go away. Do not drive yourself to the hospital. Summary Near-syncope is when you suddenly feel like you might pass out or faint. This condition is caused by a sudden decrease in blood flow to the brain. Near-syncope may be a sign of a serious medical problem, so it is important to seek medical care. If you start to feel like you might pass out, sit or lie down right away. If sitting, lower your head down between your legs. If lying down, raise (elevate) your feet above the level of your heart. Talk with your doctor about your symptoms. You may need to have testing to help find the cause. This information is not intended to replace advice given to you by your health care provider. Make sure you discuss any questions you have with your health care provider. Document Revised: 08/04/2020 Document Reviewed: 08/04/2020 Elsevier Patient Education  2024  Elsevier Inc.  Functional Neurologic Disorder Functional neurologic disorder (FND) is a condition that affects how your brain works.    It can cause symptoms such as a seizure or a stroke even if you do not have an injury to your brain, spinal cord, or nerves (nervous system). In FND, there is a problem with how your brain and body send and receive signals. The symptoms are real and may affect your quality of life. FND is also  called conversion disorder. Your care may be managed by a team of health care providers who focus on treating FND. Your team may include PCP, a mental health specialist, and rehab therapists. What are the causes? The exact cause of FND is not known. Some factors that may affect brain function include: Your brain trying to stop pain. Your brain turning off part or all of the body when it senses a threat. Stress and Fear  What increases the risk? You are more likely to get FND if: You have other conditions. These include chronic pain, epilepsy, and multiple sclerosis. You have a mental health condition, such as depression, anxiety, or post-traumatic stress disorder (PTSD). You are under a lot of stress. You were abused as a child. What are the signs or symptoms? Symptoms of FND may include: Your muscles becoming weak or no longer working (paralysis). Shaking that you cannot control and that looks like seizures (functional seizures). Tremors, jerks, or sudden muscle tightening (spasms). Loss of balance or trouble walking. Trouble swallowing or feeling like you have a lump in your throat. Stuttering or losing your voice. No longer being able to feel pain or fully losing your sense of touch. Other symptoms may include: Changes in vision. These may include blindness or double vision. Changes in hearing, such as deafness. Seeing or hearing things that are not real (hallucinations). In most cases, symptoms occur all of a sudden. They often appear around times of intense stress. How is this diagnosed? FND is diagnosed based on an exam by your provider. You will also be checked for other health problems based on your symptoms. You may have: A test to look for problems with your nervous system (neurological exam). Blood and pee (urine) tests. X-rays or other imaging studies. An electroencephalogram (EEG). This is done to look for seizures. How is this treated? Treatment will be based on your  symptoms. It may include: Techniques to help you relax. These may include yoga, meditation, and exercise. Cognitive behavioral therapy (CBT). This can help you: Learn ways to self-calm. Find out what triggers your symptoms. Decide how you want to think, feel, and act when things cause you stress. Accept what you feel and learn what you would like to change. You can then work toward making the change. This type of CBT is called acceptance and commitment therapy (ACT). Physical therapy (PT). PT can help you with walking if your strength and the way that you walk (gait) have been affected. Occupational therapy (OT). OT can help you relearn how to do daily tasks. Speech therapy. This can help if you have speech and swallowing problems. Medicine. Antidepressants may help even if you are not depressed. Follow these instructions at home:  Take over-the-counter and prescription medicines only as told by your provider. Stay away from caffeine, alcohol, and certain cold medicines. These can trigger FND. Use stress management. Work with your provider to find ways to reduce the stress in your life. Think about joining a support group. Keep all follow-up visits. Therapies may  help retrain your brain so that the symptoms go away. Where to find more information FND Hope: fndhope.org Contact a health care provider if: Your symptoms do not go away or they get worse. You get new symptoms. You feel frustrated and hopeless about your FND. Get help right away if: You feel like you may hurt yourself or others, or have thoughts about taking your own life. Go to your nearest emergency room or: Call 911. Call the National Suicide Prevention Lifeline at 8056463524 or 988. This is open 24 hours a day. Text the Crisis Text Line at 7806775220. This information is not intended to replace advice given to you by your health care provider. Make sure you discuss any questions you have with your health care  provider. Document Revised: 12/14/2021 Document Reviewed: 12/14/2021 Elsevier Patient Education  2024 Elsevier Inc.  Fast Heart Rate After Standing (Postural Orthostatic Tachycardia Syndrome): What to Know Postural orthostatic tachycardia syndrome (POTS) is a group of symptoms that happen when you stand up after lying down. It involves a fast heart rate. The symptoms get better when you lie back down. In some cases, POTS may be seen with other health problems. In other cases, it may happen on its own. What are the causes? The cause of POTS isn't known. What increases the risk? You may be more at risk if: You're female and: 80-9 years old. About to have your period. Pregnant. You take certain medicines. You've had a major injury. You have certain health problems, such as: An infection from a germ called a virus. An autoimmune disease. This happens when your body's defense system (immune system) attacks your body. A loss of red blood cells over time. A gene problem that causes your joints to be too mobile, such as Ehlers-Danlos syndrome. A thyroid  that makes too much of the thyroid  hormone. Fibromyalgia. You've had surgery. You're often dehydrated. This means there's not enough water in your body. What are the signs or symptoms? The most common symptom is feeling light-headed when you stand up from lying down or sitting. Other symptoms may include: A fast heart rate within 10 minutes of standing up. Uneven heartbeats. Feeling like you may faint or fainting. Feeling short of breath. Chest pain. Feeling like you may throw up or throwing up. You may also: Feel tired. Have muscle pain or a headache. Have memory problems. Have trouble falling asleep or staying asleep. Your symptoms may be worse in the morning. How is this diagnosed? You may be diagnosed based on you and your family's health history. You may have an exam that includes: Checking your heart rate and blood pressure  when you are: Lying down. Sitting. Standing. You may be diagnosed based on your health history and your family's health history. You may have an exam that includes: Checking your heart rate and blood pressure when: Lying down. Sitting. Standing. Blood tests. Pee tests. Tests to check for health problems that are seen with POTS. How is this treated? Treatment depends on how bad your symptoms are and if you have other health problems. You may need to: Treat other health problems. Drink two glasses of water before getting up after you lie down. Take in more salt (sodium). Take medicines to: Control your blood pressure. Slow your heart rate. Stop taking certain medicines. Start an exercise program. Wear lower body pressure (compression) devices. Follow these instructions at home: Medicines Take your medicines only as told. Talk to your health care provider about: Any vitamins, herbs, or supplements  you take. Starting any new medicines. You may need to stop or adjust some medicines if they cause POTS. Eating and drinking Drink more fluids as told. Take in more salt as told. General instructions Exercise as told. Do an aerobic exercise for 30 minutes a day, at least 4 days a week. Aerobic exercises are those that cause your heart to beat faster. Do not smoke, vape, or use nicotine or tobacco. Keep all follow-up visits. Your treatment plan may change over time. Where to find more information Standing Up to POTS: standinguptopots.org Contact a health care provider if: Your symptoms don't get better after treatment. Your symptoms get worse. You have new symptoms. You have fast or uneven heartbeats. You faint. Get help right away if: You have chest pain. You have trouble breathing. These symptoms may be an emergency. Call 911 right away. Do not wait to see if the symptoms will go away. Do not drive yourself to the hospital. This information is not intended to replace advice  given to you by your health care provider. Make sure you discuss any questions you have with your health care provider. Document Revised: 06/17/2023 Document Reviewed: 06/17/2023 Elsevier Patient Education  2025 [employer].

## 2024-04-22 ENCOUNTER — Ambulatory Visit (INDEPENDENT_AMBULATORY_CARE_PROVIDER_SITE_OTHER): Admitting: Family Medicine

## 2024-04-22 ENCOUNTER — Encounter (HOSPITAL_BASED_OUTPATIENT_CLINIC_OR_DEPARTMENT_OTHER): Payer: Self-pay | Admitting: Family Medicine

## 2024-04-22 VITALS — BP 111/87 | HR 88 | Temp 98.6°F | Resp 18 | Ht 67.0 in | Wt 191.0 lb

## 2024-04-22 DIAGNOSIS — R55 Syncope and collapse: Secondary | ICD-10-CM

## 2024-04-22 DIAGNOSIS — F324 Major depressive disorder, single episode, in partial remission: Secondary | ICD-10-CM | POA: Diagnosis not present

## 2024-04-22 DIAGNOSIS — G43009 Migraine without aura, not intractable, without status migrainosus: Secondary | ICD-10-CM | POA: Diagnosis not present

## 2024-04-22 DIAGNOSIS — R002 Palpitations: Secondary | ICD-10-CM

## 2024-04-22 DIAGNOSIS — F419 Anxiety disorder, unspecified: Secondary | ICD-10-CM | POA: Diagnosis not present

## 2024-04-22 MED ORDER — KETOROLAC TROMETHAMINE 30 MG/ML IM SOLN
30.0000 mg | Freq: Once | INTRAMUSCULAR | Status: AC
  Administered 2024-04-22: 30 mg via INTRAMUSCULAR

## 2024-04-22 NOTE — Assessment & Plan Note (Signed)
 Patient currently dealing with headache that has not responded to conservative measures including Tylenol  and ibuprofen .  She wonders about further inventions to help with headache resolution.  We discussed options, she would be amenable to Toradol  shot in office today, discussed potential risks and benefits.  Advised on avoiding other NSAIDs temporarily, can continue with Tylenol  as needed. Recommend maintaining headache diary, particularly if headaches are occurring more frequently or if symptoms are gradually worsening.

## 2024-04-22 NOTE — Assessment & Plan Note (Signed)
 Has not had recent syncopal episode.  She did have prior evaluation with neurology due to concern for potential underlying POTS, however their evaluation did not suggest underlying POTS as cause of her ongoing symptoms.  Related to syncopal episodes and reported issues with palpitations, patient was recommended to have further cardiovascular evaluation.  Patient amenable to this, would like to have referral placed.  Referral to cardiology placed today

## 2024-04-22 NOTE — Assessment & Plan Note (Signed)
 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  She also has worked with Damien Junk in the past, however has not had recent follow-up.  She indicates that she plans to contact family's office to resume seeing her. She does have some current symptoms, however does not feel that she wants to resume medication presently.  I would recommend scheduling follow-up with Emily's office.  We can consider potentially resuming medication in the future pending progress of symptoms.

## 2024-04-22 NOTE — Progress Notes (Signed)
" ° ° °  Procedures performed today:    None.  Independent interpretation of notes and tests performed by another provider:   None.  Brief History, Exam, Impression, and Recommendations:    BP 111/87 (BP Location: Left Arm, Patient Position: Sitting, Cuff Size: Normal)   Pulse 88   Temp 98.6 F (37 C) (Oral)   Resp 18   Ht 5' 7 (1.702 m)   Wt 191 lb (86.6 kg)   LMP  (LMP Unknown)   SpO2 99%   BMI 29.91 kg/m   Migraine without aura and without status migrainosus, not intractable Assessment & Plan: Patient currently dealing with headache that has not responded to conservative measures including Tylenol  and ibuprofen .  She wonders about further inventions to help with headache resolution.  We discussed options, she would be amenable to Toradol  shot in office today, discussed potential risks and benefits.  Advised on avoiding other NSAIDs temporarily, can continue with Tylenol  as needed. Recommend maintaining headache diary, particularly if headaches are occurring more frequently or if symptoms are gradually worsening.  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  She also has worked with Damien Junk in the past, however has not had recent follow-up.  She indicates that she plans to contact family's office to resume seeing her. She does have some current symptoms, however does not feel that she wants to resume medication presently.  I would recommend scheduling follow-up with Emily's office.  We can consider potentially resuming medication in the future pending progress of symptoms.   Palpitations Assessment & Plan: As discussed below   Syncope, unspecified syncope type Assessment & Plan: Has not had recent  syncopal episode.  She did have prior evaluation with neurology due to concern for potential underlying POTS, however their evaluation did not suggest underlying POTS as cause of her ongoing symptoms.  Related to syncopal episodes and reported issues with palpitations, patient was recommended to have further cardiovascular evaluation.  Patient amenable to this, would like to have referral placed.  Referral to cardiology placed today   Return if symptoms worsen or fail to improve, for already has scheduled appointment for CPE.   ___________________________________________ Ariel Ballman de [place], MD, ABFM, CAQSM [facility] "

## 2024-04-22 NOTE — Assessment & Plan Note (Signed)
 See separate discussion below

## 2024-04-22 NOTE — Assessment & Plan Note (Signed)
As discussed below. °

## 2024-04-30 ENCOUNTER — Emergency Department (HOSPITAL_COMMUNITY): Payer: No Typology Code available for payment source

## 2024-04-30 ENCOUNTER — Encounter (HOSPITAL_COMMUNITY): Payer: Self-pay | Admitting: Emergency Medicine

## 2024-04-30 ENCOUNTER — Emergency Department (HOSPITAL_COMMUNITY)
Admission: EM | Admit: 2024-04-30 | Discharge: 2024-04-30 | Disposition: A | Discharge status: Home / Self Care | Payer: No Typology Code available for payment source | Attending: Emergency Medicine | Admitting: Emergency Medicine

## 2024-04-30 DIAGNOSIS — Y92410 Unspecified street and highway as the place of occurrence of the external cause: Secondary | ICD-10-CM | POA: Insufficient documentation

## 2024-04-30 DIAGNOSIS — M25551 Pain in right hip: Secondary | ICD-10-CM | POA: Insufficient documentation

## 2024-04-30 DIAGNOSIS — M25559 Pain in unspecified hip: Secondary | ICD-10-CM

## 2024-04-30 HISTORY — DX: Postural orthostatic tachycardia syndrome (POTS): G90.A

## 2024-04-30 MED ORDER — FENTANYL CITRATE (PF) 100 MCG/2ML IJ SOLN
INTRAMUSCULAR | Status: DC | PRN
  Administered 2024-04-30: 50 ug via INTRAVENOUS

## 2024-04-30 MED ADMIN — Iohexol IV Soln 350 MG/ML: 75 mL | INTRAVENOUS | NDC 00407141490

## 2024-04-30 MED FILL — Fentanyl Citrate PF Soln Prefilled Syringe 50 MCG/ML: INTRAMUSCULAR | Qty: 1 | Status: AC
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
## 2024-04-30 NOTE — ED Notes (Signed)
 Attempted to assist patient in ambulation, states dizzy s/p fentanyl , repositioned in bed with callbell at bedside. Awaiting arrival of family. C-spine cleared by Dr. Sebastian.

## 2024-04-30 NOTE — ED Notes (Signed)
 Pt's spouse at bedside, updated. Attempted to call mother listed in chart but answering party hung up phone prior to speaking.
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
## 2024-05-01 ENCOUNTER — Encounter (HOSPITAL_BASED_OUTPATIENT_CLINIC_OR_DEPARTMENT_OTHER): Payer: Self-pay | Admitting: Family Medicine

## 2024-05-01 NOTE — ED Notes (Signed)
 This RN left voice message on the pts voicemail regarding the earrings found in the pts room. Notified that earrings will be left at the charge nurse desk.

## 2024-05-06 ENCOUNTER — Telehealth (HOSPITAL_BASED_OUTPATIENT_CLINIC_OR_DEPARTMENT_OTHER): Payer: Self-pay | Admitting: Family Medicine
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
## 2024-05-07 ENCOUNTER — Encounter: Payer: Self-pay | Admitting: Family Medicine

## 2024-05-07 ENCOUNTER — Ambulatory Visit: Admitting: Family Medicine

## 2024-05-07 VITALS — BP 98/70 | HR 83 | Temp 99.2°F | Ht 67.0 in | Wt 193.5 lb

## 2024-05-07 DIAGNOSIS — M25512 Pain in left shoulder: Secondary | ICD-10-CM | POA: Insufficient documentation

## 2024-05-07 DIAGNOSIS — M79642 Pain in left hand: Secondary | ICD-10-CM | POA: Insufficient documentation

## 2024-05-07 DIAGNOSIS — M25552 Pain in left hip: Secondary | ICD-10-CM | POA: Diagnosis not present

## 2024-05-07 MED ORDER — DICLOFENAC SODIUM 75 MG PO TBEC: 75.0000 mg | DELAYED_RELEASE_TABLET | Freq: Two times a day (BID) | ORAL | 0 refills | Status: AC
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
## 2024-05-22 ENCOUNTER — Ambulatory Visit (HOSPITAL_BASED_OUTPATIENT_CLINIC_OR_DEPARTMENT_OTHER): Admitting: Family Medicine

## 2024-06-01 ENCOUNTER — Inpatient Hospital Stay (HOSPITAL_BASED_OUTPATIENT_CLINIC_OR_DEPARTMENT_OTHER): Admitting: Family Medicine

## 2024-09-01 ENCOUNTER — Ambulatory Visit (HOSPITAL_BASED_OUTPATIENT_CLINIC_OR_DEPARTMENT_OTHER)

## 2024-09-07 ENCOUNTER — Encounter (HOSPITAL_BASED_OUTPATIENT_CLINIC_OR_DEPARTMENT_OTHER): Admitting: Family Medicine
